# Patient Record
Sex: Female | Born: 1990 | Race: White | Hispanic: No | Marital: Married | Smoking: Never smoker
Health system: Southern US, Community
[De-identification: ages and names within clinical notes are randomized; demographics above are authoritative.]

## PROBLEM LIST (undated history)

## (undated) DIAGNOSIS — T7840XA Allergy, unspecified, initial encounter: Secondary | ICD-10-CM

## (undated) DIAGNOSIS — F32A Depression, unspecified: Secondary | ICD-10-CM

## (undated) DIAGNOSIS — J45909 Unspecified asthma, uncomplicated: Secondary | ICD-10-CM

## (undated) DIAGNOSIS — O24419 Gestational diabetes mellitus in pregnancy, unspecified control: Secondary | ICD-10-CM

## (undated) DIAGNOSIS — N970 Female infertility associated with anovulation: Secondary | ICD-10-CM

## (undated) DIAGNOSIS — Z800 Family history of malignant neoplasm of digestive organs: Secondary | ICD-10-CM

## (undated) DIAGNOSIS — E282 Polycystic ovarian syndrome: Secondary | ICD-10-CM

## (undated) DIAGNOSIS — G43909 Migraine, unspecified, not intractable, without status migrainosus: Secondary | ICD-10-CM

## (undated) DIAGNOSIS — R7303 Prediabetes: Secondary | ICD-10-CM

## (undated) DIAGNOSIS — R42 Dizziness and giddiness: Secondary | ICD-10-CM

## (undated) DIAGNOSIS — E669 Obesity, unspecified: Secondary | ICD-10-CM

## (undated) DIAGNOSIS — F419 Anxiety disorder, unspecified: Secondary | ICD-10-CM

## (undated) DIAGNOSIS — F329 Major depressive disorder, single episode, unspecified: Secondary | ICD-10-CM

## (undated) DIAGNOSIS — K219 Gastro-esophageal reflux disease without esophagitis: Secondary | ICD-10-CM

## (undated) DIAGNOSIS — O99019 Anemia complicating pregnancy, unspecified trimester: Secondary | ICD-10-CM

## (undated) DIAGNOSIS — E559 Vitamin D deficiency, unspecified: Secondary | ICD-10-CM

## (undated) DIAGNOSIS — O219 Vomiting of pregnancy, unspecified: Secondary | ICD-10-CM

## (undated) DIAGNOSIS — R1110 Vomiting, unspecified: Secondary | ICD-10-CM

## (undated) HISTORY — DX: Unspecified asthma, uncomplicated: J45.909

## (undated) HISTORY — DX: Obesity, unspecified: E66.9

## (undated) HISTORY — DX: Family history of malignant neoplasm of digestive organs: Z80.0

## (undated) HISTORY — DX: Gastro-esophageal reflux disease without esophagitis: K21.9

## (undated) HISTORY — DX: Gestational diabetes mellitus in pregnancy, unspecified control: O24.419

## (undated) HISTORY — DX: Depression, unspecified: F32.A

## (undated) HISTORY — DX: Prediabetes: R73.03

## (undated) HISTORY — DX: Polycystic ovarian syndrome: E28.2

## (undated) HISTORY — DX: Vomiting of pregnancy, unspecified: O21.9

## (undated) HISTORY — DX: Vomiting, unspecified: R11.10

## (undated) HISTORY — DX: Migraine, unspecified, not intractable, without status migrainosus: G43.909

## (undated) HISTORY — DX: Vitamin D deficiency, unspecified: E55.9

## (undated) HISTORY — DX: Anxiety disorder, unspecified: F41.9

## (undated) HISTORY — DX: Allergy, unspecified, initial encounter: T78.40XA

## (undated) HISTORY — DX: Major depressive disorder, single episode, unspecified: F32.9

## (undated) HISTORY — PX: WISDOM TOOTH EXTRACTION: SHX21

## (undated) HISTORY — DX: Anemia complicating pregnancy, unspecified trimester: O99.019

## (undated) HISTORY — DX: Female infertility associated with anovulation: N97.0

---
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
## 2015-07-14 ENCOUNTER — Encounter: Payer: Self-pay | Admitting: Psychiatry

## 2015-07-14 ENCOUNTER — Ambulatory Visit (INDEPENDENT_AMBULATORY_CARE_PROVIDER_SITE_OTHER): Payer: BLUE CROSS/BLUE SHIELD | Admitting: Psychiatry

## 2015-07-14 VITALS — BP 139/94 | HR 108 | Temp 99.2°F | Ht 68.0 in | Wt 236.0 lb

## 2015-07-14 DIAGNOSIS — F331 Major depressive disorder, recurrent, moderate: Secondary | ICD-10-CM

## 2015-07-14 DIAGNOSIS — G4700 Insomnia, unspecified: Secondary | ICD-10-CM | POA: Insufficient documentation

## 2015-07-14 DIAGNOSIS — F411 Generalized anxiety disorder: Secondary | ICD-10-CM | POA: Diagnosis not present

## 2015-07-14 DIAGNOSIS — G43909 Migraine, unspecified, not intractable, without status migrainosus: Secondary | ICD-10-CM | POA: Insufficient documentation

## 2015-07-14 DIAGNOSIS — F410 Panic disorder [episodic paroxysmal anxiety] without agoraphobia: Secondary | ICD-10-CM | POA: Diagnosis not present

## 2015-07-14 HISTORY — DX: Migraine, unspecified, not intractable, without status migrainosus: G43.909

## 2015-07-14 MED ORDER — CLONAZEPAM 0.5 MG PO TABS: 0.5000 mg | ORAL_TABLET | Freq: Two times a day (BID) | ORAL | Status: DC

## 2015-07-14 MED ORDER — PAROXETINE HCL 40 MG PO TABS: 40.0000 mg | ORAL_TABLET | ORAL | Status: DC
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
## 2015-07-15 ENCOUNTER — Telehealth: Payer: Self-pay

## 2015-07-15 NOTE — Telephone Encounter (Signed)
pt still receiveds clonazepam. . doctor refilled on 07-14-15 with one refill

## 2015-08-14 ENCOUNTER — Encounter: Payer: Self-pay | Admitting: Psychiatry

## 2015-08-14 ENCOUNTER — Ambulatory Visit (INDEPENDENT_AMBULATORY_CARE_PROVIDER_SITE_OTHER): Payer: BLUE CROSS/BLUE SHIELD | Admitting: Psychiatry

## 2015-08-14 VITALS — BP 118/82 | HR 106 | Temp 98.2°F | Ht 68.0 in | Wt 231.4 lb

## 2015-08-14 DIAGNOSIS — F331 Major depressive disorder, recurrent, moderate: Secondary | ICD-10-CM

## 2015-08-14 DIAGNOSIS — F411 Generalized anxiety disorder: Secondary | ICD-10-CM | POA: Diagnosis not present

## 2015-08-14 DIAGNOSIS — F410 Panic disorder [episodic paroxysmal anxiety] without agoraphobia: Secondary | ICD-10-CM | POA: Diagnosis not present

## 2015-08-14 MED ORDER — CLONAZEPAM 0.5 MG PO TABS: 0.5000 mg | ORAL_TABLET | Freq: Two times a day (BID) | ORAL | Status: DC | PRN
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
## 2015-09-16 ENCOUNTER — Telehealth: Payer: Self-pay

## 2015-09-16 NOTE — Telephone Encounter (Signed)
pt called states that she went to the [facility] dr Jean Rosenthal today and dr. Jean Rosenthal did not want her to take the paxil and the klonopin because she is trying to get pregnant.  dr. Jean Rosenthal would like to know if she can take zoloft.

## 2015-09-17 NOTE — Telephone Encounter (Signed)
pt was advised -per williams will get with nicole and or tina and see which one can see her.

## 2015-10-14 ENCOUNTER — Telehealth: Payer: Self-pay

## 2015-10-14 ENCOUNTER — Ambulatory Visit (INDEPENDENT_AMBULATORY_CARE_PROVIDER_SITE_OTHER): Payer: BLUE CROSS/BLUE SHIELD | Admitting: Licensed Clinical Social Worker

## 2015-10-14 ENCOUNTER — Ambulatory Visit: Payer: BLUE CROSS/BLUE SHIELD | Admitting: Psychiatry

## 2015-10-14 DIAGNOSIS — F331 Major depressive disorder, recurrent, moderate: Secondary | ICD-10-CM | POA: Diagnosis not present

## 2015-10-14 NOTE — Telephone Encounter (Signed)
pt seen Brianna Marshall today., pt states that on 09-16-15 you told her to stop her paxil cause she is trying to get pregant.  pt states today that since she has stopped taking she has been having like shock tingling feeling from her hand to her finger and sometimes it goes from her head to her toes.  Started out several times a day
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
## 2015-10-20 NOTE — Telephone Encounter (Signed)
patient answered phone, she states that she is not having any more tingling feeling.  pt states she is doing better now.

## 2015-10-28 ENCOUNTER — Encounter: Payer: Self-pay | Admitting: Psychiatry

## 2015-10-28 ENCOUNTER — Ambulatory Visit (INDEPENDENT_AMBULATORY_CARE_PROVIDER_SITE_OTHER): Payer: BLUE CROSS/BLUE SHIELD | Admitting: Psychiatry

## 2015-10-28 VITALS — BP 124/88 | HR 108 | Temp 97.7°F | Ht 68.0 in | Wt 235.6 lb

## 2015-10-28 DIAGNOSIS — F410 Panic disorder [episodic paroxysmal anxiety] without agoraphobia: Secondary | ICD-10-CM | POA: Diagnosis not present

## 2015-10-28 DIAGNOSIS — F331 Major depressive disorder, recurrent, moderate: Secondary | ICD-10-CM | POA: Diagnosis not present

## 2015-10-28 DIAGNOSIS — F411 Generalized anxiety disorder: Secondary | ICD-10-CM | POA: Diagnosis not present

## 2015-10-28 NOTE — Progress Notes (Signed)
BH MD/PA/NP OP Progress Note  10/28/2015 3:52 PM Brianna AskewChristine S Marshall  MRN:  [id number]  Subjective:  Patient returns for follow-up of her major depressive disorder, recurrent moderate, generalized anxiety disorder and panic disorder without agoraphobia. She contacted the clinic indicated she is actively trying to become pregnant. I slender on the phone at that time that ideally there are potential risks to the fetus from the antidepressant medications and ideally she should not be on them. Patient Brianna Marshall she is now been off the medication since that discussion in September of this year. She states she's no longer having the electrical like tingling from when she was coming off the medication. She states her mood is been good. States she has had some anxieties week but relates that it was due to the elections. She states he took her last Klonopin last night.  We have agreed that she'll proceed with therapy at this time to address mood and anxiety. He indicates she will try to get pregnant for about a year and if that does not work they may pursue adoption. She states at that point she may pursue returning to medication use. Chief Complaint    Follow-up; Anxiety     Visit Diagnosis:  No diagnosis found.  Past Medical History:  Past Medical History  Diagnosis Date  . Depression   . Obesity   . Polycystic disease, ovaries   . Asthma    History reviewed. No pertinent past surgical history. Family History:  Family History  Problem Relation Age of Onset  . Osteoporosis Mother   . Alcohol abuse Father   . Drug abuse Father   . Alcohol abuse Maternal Aunt   . Bipolar disorder Maternal Aunt   . Depression Paternal Grandmother    Social History:  Social History   Social History  . Marital Status: Married    Spouse Name: N/A  . Number of Children: N/A  . Years of Education: N/A   Social History Main Topics  . Smoking status: Never Smoker   . Smokeless tobacco: Never Used  . Alcohol Use:  0.6 oz/week    0 Standard drinks or equivalent, 0 Glasses of wine, 1 Cans of beer, 0 Shots of liquor per week     Comment: maybe one 1 a month  . Drug Use: No  . Sexual Activity: Yes    Birth Control/ Protection: None   Other Topics Concern  . None   Social History Narrative   Additional History:   Assessment:   Musculoskeletal: Strength & Muscle Tone: within normal limits Gait & Station: normal Patient leans: N/A  Psychiatric Specialty Exam: Anxiety Symptoms include nervous/anxious behavior. Patient reports no insomnia or suicidal ideas.      Review of Systems  Psychiatric/Behavioral: Negative for depression, suicidal ideas, hallucinations, memory loss and substance abuse. The patient is nervous/anxious. The patient does not have insomnia.   All other systems reviewed and are negative.   Blood pressure 124/88, pulse 108, temperature 97.7 F (36.5 C), temperature source Tympanic, height 5\' 8"  (1.727 m), weight 235 lb 9.6 oz (106.867 kg), last menstrual period 10/26/2015, SpO2 98 %.Body mass index is 35.83 kg/(m^2).  General Appearance: Well Groomed  Eye Contact:  Good  Speech:  Normal Rate  Volume:  Normal  Mood:  Good  Affect:  Congruent and Smiling  Thought Process:  Linear and Logical  Orientation:  Full (Time, Place, and Person)  Thought Content:  Negative  Suicidal Thoughts:  No  Homicidal Thoughts:  No  Memory:  Immediate;   Good Recent;   Good Remote;   Good  Judgement:  Good  Insight:  Good  Psychomotor Activity:  Negative  Concentration:  Good  Recall:  Good  Fund of Knowledge: Good  Language: Good  Akathisia:  Negative  Handed:  Right unknown   AIMS (if indicated): N/A  Assets:  Communication Skills Desire for Improvement Social Support  ADL's:  Intact  Cognition: WNL  Sleep:  good   Is the patient at risk to self?  No. Has the patient been a risk to self in the past 6 months?  No. Has the patient been a risk to self within the distant past?   No. Is the patient a risk to others?  No. Has the patient been a risk to others in the past 6 months?  No. Has the patient been a risk to others within the distant past?  No.  Current Medications: Current Outpatient Prescriptions  Medication Sig Dispense Refill  . albuterol (PROVENTIL HFA) 108 (90 BASE) MCG/ACT inhaler Inhale into the lungs.    . medroxyPROGESTERone (PROVERA) 10 MG tablet Take 10 mg by mouth daily.  5  . clonazePAM (KLONOPIN) 0.5 MG tablet Take 1 tablet (0.5 mg total) by mouth 2 (two) times daily as needed for anxiety (May  take one half a tablet instead in the morning instead of a whole tablet.). (Patient not taking: Reported on 10/28/2015) 60 tablet 1  . PARoxetine (PAXIL) 40 MG tablet Take 1 tablet (40 mg total) by mouth every morning. (Patient not taking: Reported on 10/28/2015) 30 tablet 1   No current facility-administered medications for this visit.    Medical Decision Making:  Established Problem, Stable/Improving (1) and Review of Medication Regimen & Side Effects (2)  Treatment Plan Summary:Medication management and Plan  Major  depressive disorder-at this time patient's mood is stable and she is actively trying to get pregnant and thus we made the decision to discontinue use of Paxil. We'll engage in therapy.  Generalized anxiety disorder-therapy to address anxiety issues.  He is been instructed to contact us with any questions or concerns, however she will continue therapy in this clinic.  Wallace Going 10/28/2015, 3:52 PM

## 2015-11-18 ENCOUNTER — Ambulatory Visit (INDEPENDENT_AMBULATORY_CARE_PROVIDER_SITE_OTHER): Payer: BLUE CROSS/BLUE SHIELD | Admitting: Licensed Clinical Social Worker

## 2015-11-18 DIAGNOSIS — F411 Generalized anxiety disorder: Secondary | ICD-10-CM | POA: Diagnosis not present

## 2015-11-24 NOTE — Progress Notes (Signed)
   THERAPIST PROGRESS NOTE  Session Time: 60min  Participation Level: Active  Behavioral Response: Casual and NeatAlertAppropriate  Type of Therapy: Individual Therapy  Treatment Goals addressed: Coping  Interventions: CBT, Motivational Interviewing, Solution Focused, Supportive, Family Systems and Reframing  Summary: Brianna AskewChristine S Marshall is a 24 y.o. female who presents with continued symptoms of her diagnosis.  She states that she is taking her medication as prescribed but at times does not feel relief from diagnosis.  She states that the holidays are the worse time for her due to having to travel and interact with family and in laws.She continues to be supported by her husband and at times he becomes overwhelmed with her anxiety attacks.  Discussed stressors and role played coping skills.   Suicidal/Homicidal: Nowithout intent/plan  Therapist Response: Writer recommends Outpatient Therapy at least twice monthly to include but not limited to individual, group and or family therapy.  Medication Management is also recommended to assist with her mood.   Plan: Return again in 2 weeks.  Diagnosis: Axis I: Generalized Anxiety Disorder    Axis II: No diagnosis    Brianna Marshall 11/24/2015

## 2015-12-09 ENCOUNTER — Ambulatory Visit (INDEPENDENT_AMBULATORY_CARE_PROVIDER_SITE_OTHER): Payer: BLUE CROSS/BLUE SHIELD | Admitting: Licensed Clinical Social Worker

## 2015-12-09 DIAGNOSIS — F331 Major depressive disorder, recurrent, moderate: Secondary | ICD-10-CM | POA: Diagnosis not present

## 2015-12-09 DIAGNOSIS — F411 Generalized anxiety disorder: Secondary | ICD-10-CM | POA: Diagnosis not present

## 2015-12-16 NOTE — Progress Notes (Signed)
   THERAPIST PROGRESS NOTE  Session Time: 60min  Participation Level: Active  Behavioral Response: Casual and NeatAlertDepressed  Type of Therapy: Individual Therapy  Treatment Goals addressed: Coping  Interventions: CBT, Motivational Interviewing, Solution Focused, Supportive and Reframing  Summary: Brianna AskewChristine S Lowrey is a 24 y.o. female who presents with continued symptoms of her diagnosis.  She recently was given several days off from work due to being overwhelmed at work.  She states that her best friend is getting married to a "bad guy" which is stressful for her.  She states that she is worried about the finances in the home and wants to improve her hours at work.  She states that she wants to get pregnant therefore she is not taking her medication..   Suicidal/Homicidal: Yeswithout intent/plan  Therapist Response: LCSW provided Patient with ongoing emotional support and encouragement.  Normalized her feelings.  Commended Patient on her progress and reinforced the importance of client staying focused on her own strengths and resources and resiliency. Processed various strategies for dealing with stressors.    Plan: Return again in 2 weeks.  Diagnosis: Axis I: Generalized Anxiety Disorder and Major Depression, Recurrent severe    Axis II: No diagnosis    [name] 12/16/2015

## 2016-01-07 ENCOUNTER — Ambulatory Visit: Payer: BLUE CROSS/BLUE SHIELD | Admitting: Licensed Clinical Social Worker

## 2016-01-14 ENCOUNTER — Ambulatory Visit (INDEPENDENT_AMBULATORY_CARE_PROVIDER_SITE_OTHER): Payer: BLUE CROSS/BLUE SHIELD | Admitting: Licensed Clinical Social Worker

## 2016-01-14 DIAGNOSIS — F411 Generalized anxiety disorder: Secondary | ICD-10-CM | POA: Diagnosis not present

## 2016-01-20 NOTE — Progress Notes (Signed)
   THERAPIST PROGRESS NOTE  Session Time:9min  Participation Level: Active  Behavioral Response: CasualAlertAnxious  Type of Therapy: Individual Therapy  Treatment Goals addressed: Coping  Interventions: CBT, Solution Focused, Supportive, Family Systems and Reframing  Summary: Brianna Marshall is a 25 y.o. female who presents with continued symptoms of her diagnosis.  She continues to express anxiety with her life.  She is currently wanting to become pregnant so she is not taking her medication.  She continues to have difficulty with working at her current employer due to stress.  She explored current stressors and was unable to discuss any coping skills to diminish her symptoms.  Given homework to journal and focus on listing coping skills.   Suicidal/Homicidal: Nowithout intent/plan  Therapist Response: LCSW provided Patient with ongoing emotional support and encouragement.  Normalized her feelings.  Commended Patient on her progress and reinforced the importance of client staying focused on her own strengths and resources and resiliency. Processed various strategies for dealing with stressors.    Plan: Return again in 2 weeks.  Diagnosis: Axis I: Generalized Anxiety Disorder    Axis II: No diagnosis    Marinda Elk 01/15/2016

## 2016-02-11 ENCOUNTER — Ambulatory Visit (INDEPENDENT_AMBULATORY_CARE_PROVIDER_SITE_OTHER): Payer: BLUE CROSS/BLUE SHIELD | Admitting: Licensed Clinical Social Worker

## 2016-02-11 DIAGNOSIS — F411 Generalized anxiety disorder: Secondary | ICD-10-CM

## 2016-02-16 NOTE — Progress Notes (Signed)
   THERAPIST PROGRESS NOTE  Session Time: 86  Participation Level: Active  Behavioral Response: CasualAlertAnxious  Type of Therapy: Individual Therapy  Treatment Goals addressed: Coping  Interventions: CBT, Motivational Interviewing, Solution Focused, Family Systems and Reframing  Summary: Brianna Marshall is a 25 y.o. female who presents with continued symptoms of her diagnosis.  Patient expressed her concerns regarding her friend upcoming wedding. Therapist and Patient continued with discussion about avoiding triggers to anxiety.  Therapist requested that Patient be mindful of the things that cause her to become anxious over the next week.  Therapist encouraged Patient to utilize a journal. Therapist educated Patient on the positive effects of a journal. Therapist requested that Patient note her reaction to the trigger as well. Discussion of Therapist going on Maternity Leave and provided patient with a list of therapist in the area.   Suicidal/Homicidal: Nowithout intent/plan  Therapist Response: LCSW provided Patient with ongoing emotional support and encouragement.  Normalized her feelings.  Commended Patient on her progress and reinforced the importance of client staying focused on her own strengths and resources and resiliency. Processed various strategies for dealing with stressors.    Plan: Discussion of Therapist going on Maternity Leave and provided patient with a list of therapist in the area. .  Diagnosis: Axis I: Generalized Anxiety Disorder    Axis II: No diagnosis    Marinda Elk 02/13/2016

## 2016-06-09 ENCOUNTER — Ambulatory Visit: Payer: No Typology Code available for payment source | Admitting: Licensed Clinical Social Worker

## 2016-07-08 ENCOUNTER — Ambulatory Visit (INDEPENDENT_AMBULATORY_CARE_PROVIDER_SITE_OTHER): Payer: BLUE CROSS/BLUE SHIELD | Admitting: Internal Medicine

## 2016-07-08 ENCOUNTER — Encounter: Payer: Self-pay | Admitting: Internal Medicine

## 2016-07-08 ENCOUNTER — Ambulatory Visit
Admission: RE | Admit: 2016-07-08 | Discharge: 2016-07-08 | Disposition: A | Discharge status: Home / Self Care | Payer: BLUE CROSS/BLUE SHIELD | Source: Ambulatory Visit | Attending: Internal Medicine | Admitting: Internal Medicine

## 2016-07-08 VITALS — BP 122/78 | HR 106 | Resp 16 | Ht 68.0 in | Wt 229.0 lb

## 2016-07-08 DIAGNOSIS — R7303 Prediabetes: Secondary | ICD-10-CM

## 2016-07-08 DIAGNOSIS — E282 Polycystic ovarian syndrome: Secondary | ICD-10-CM | POA: Diagnosis not present

## 2016-07-08 DIAGNOSIS — M79671 Pain in right foot: Secondary | ICD-10-CM | POA: Diagnosis present

## 2016-07-08 DIAGNOSIS — E559 Vitamin D deficiency, unspecified: Secondary | ICD-10-CM

## 2016-07-08 HISTORY — DX: Vitamin D deficiency, unspecified: E55.9

## 2016-07-08 HISTORY — DX: Prediabetes: R73.03

## 2016-07-08 HISTORY — DX: Polycystic ovarian syndrome: E28.2

## 2016-07-08 NOTE — Progress Notes (Signed)
Date:  07/08/2016   Name:  Brianna Marshall   DOB:  December 02, 1991   MRN:  [id number]   Chief Complaint: Foot Pain She has been walking 3 miles a day and dieting for weight loss.  Several weeks ago walked in old shoes with no laces.  Afterwards started to have pain in the top of her right foot.  She took it easy a few days then started walking again in new shoes. Now her foot is much more painful, slightly swollen on the outer ankle.  She has been taking some tylenol but not regularly.  She has rested the past 2 days.   Review of Systems  Constitutional: Negative for fever, chills and fatigue.  Respiratory: Negative for chest tightness, shortness of breath and wheezing.   Cardiovascular: Negative for chest pain.  Musculoskeletal: Positive for joint swelling, arthralgias and gait problem.  Neurological: Negative for weakness and numbness.    Patient Active Problem List   Diagnosis Date Noted  . PCOS (polycystic ovarian syndrome) 07/08/2016  . Prediabetes 07/08/2016  . Vitamin D deficiency 07/08/2016  . Headache, migraine 07/14/2015    Prior to Admission medications   Medication Sig Start Date End Date Taking? Authorizing Provider  albuterol (PROVENTIL HFA) 108 (90 BASE) MCG/ACT inhaler Inhale into the lungs. 10/21/14  Yes Historical Provider, MD  medroxyPROGESTERone (PROVERA) 10 MG tablet Take 10 mg by mouth daily. 06/17/15  Yes Historical Provider, MD    Allergies  Allergen Reactions  . Amoxicillin Rash    History reviewed. No pertinent past surgical history.  Social History  Substance Use Topics  . Smoking status: Never Smoker   . Smokeless tobacco: Never Used  . Alcohol Use: 0.6 oz/week    0 Standard drinks or equivalent, 0 Glasses of wine, 1 Cans of beer, 0 Shots of liquor per week     Comment: maybe one 1 a month     Medication list has been reviewed and updated.   Physical Exam  Constitutional: She is oriented to person, place, and time. She appears  well-developed and well-nourished. No distress.  HENT:  Head: Normocephalic and atraumatic.  Neck: Normal range of motion. Neck supple. No thyromegaly present.  Cardiovascular: Normal rate, regular rhythm and normal heart sounds.   Pulses:      Dorsalis pedis pulses are 2+ on the right side, and 2+ on the left side.       Posterior tibial pulses are 2+ on the right side, and 2+ on the left side.  Pulmonary/Chest: Effort normal and breath sounds normal. No respiratory distress. She has no wheezes.  Musculoskeletal:       Right ankle: She exhibits decreased range of motion and swelling. She exhibits no ecchymosis. Tenderness. Achilles tendon normal.       Feet:  Neurological: She is alert and oriented to person, place, and time.  Skin: Skin is warm and dry. No rash noted.  Psychiatric: She has a normal mood and affect. Her behavior is normal. Thought content normal.  Nursing note and vitals reviewed.   BP 122/78 mmHg  Pulse 106  Resp 16  Ht  (1.727 m)  Wt 229 lb (103.874 kg)  BMI 34.83 kg/m2  SpO2 98%  LMP 04/24/2016  Assessment and Plan: 1. Foot pain, right Concern for stress fracture If xrays negative - take advil 400 mg tid and ice daily - DG Foot Complete Right; Future - DG Ankle Complete Right; Future  2. PCOS (polycystic ovarian syndrome) [facility]   Jazzalyn Loewenstein, MD [facility]  07/08/2016

## 2016-07-08 NOTE — Patient Instructions (Signed)
Ibuprofen 400 mg three times a day.  Ice for 20 minutes after walking and at the end of the day.

## 2016-07-28 ENCOUNTER — Ambulatory Visit: Payer: BLUE CROSS/BLUE SHIELD | Admitting: Licensed Clinical Social Worker

## 2016-07-28 ENCOUNTER — Encounter: Payer: Self-pay | Admitting: Psychiatry

## 2016-07-28 ENCOUNTER — Ambulatory Visit (INDEPENDENT_AMBULATORY_CARE_PROVIDER_SITE_OTHER): Payer: BLUE CROSS/BLUE SHIELD | Admitting: Psychiatry

## 2016-07-28 VITALS — BP 137/97 | HR 90 | Temp 98.0°F | Ht 66.0 in | Wt 227.2 lb

## 2016-07-28 DIAGNOSIS — F410 Panic disorder [episodic paroxysmal anxiety] without agoraphobia: Secondary | ICD-10-CM

## 2016-07-28 DIAGNOSIS — F331 Major depressive disorder, recurrent, moderate: Secondary | ICD-10-CM

## 2016-07-28 MED ORDER — QUETIAPINE FUMARATE 25 MG PO TABS: 25.0000 mg | ORAL_TABLET | Freq: Every day | ORAL | 1 refills | Status: DC

## 2016-07-28 MED ORDER — VENLAFAXINE HCL ER 75 MG PO CP24: 75.0000 mg | ORAL_CAPSULE | Freq: Every day | ORAL | 1 refills | Status: DC

## 2016-07-28 NOTE — Progress Notes (Signed)
BH MD/PA/NP OP Progress Note  07/28/2016 9:37 AM Brianna Marshall  MRN:  [id number]  Subjective:  Patient is 25 year old female who was previously following Dr. Mayford Knife. She return for the appointment after she was last seen in November. She reported that she has been experiencing panic attacks for the past 2-3 months. She reported that she has been having panic attacks which has worsened in the past few weeks. She is currently afraid of her boss who is very mean. She is working in [employer]. She feels that she is afraid of making mistakes. She has 12 panic attacks in the past week. Patient reported that she is not taking any medications at this time. She is interested in taking medications to help with the anxiety panic attacks and insomnia. She currently lives with her husband. She appeared anxious and apprehensive during the interview. Patient reported that she is not planning to become pregnant at this time. She is not following with any therapist as well.  She denied having any suicidal homicidal ideations or plans. She denied using any drugs or alcohol.   Chief Complaint    Panic Attack; Anxiety     Visit Diagnosis:     ICD-9-CM ICD-10-CM   1. Panic disorder 300.01 F41.0   2. Major depressive disorder, recurrent episode, moderate (HCC) 296.32 F33.1     Past Medical History:  Past Medical History:  Diagnosis Date  . Asthma   . Depression   . Obesity   . Polycystic disease, ovaries    History reviewed. No pertinent surgical history. Family History:  Family History  Problem Relation Age of Onset  . Osteoporosis Mother   . Alcohol abuse Father   . Drug abuse Father   . Alcohol abuse Maternal Aunt   . Bipolar disorder Maternal Aunt   . Depression Paternal Grandmother    Social History:  Social History   Social History  . Marital status: Married    Spouse name: N/A  . Number of children: N/A  . Years of education: N/A   Social History Main Topics  . Smoking  status: Never Smoker  . Smokeless tobacco: Never Used  . Alcohol use 0.6 oz/week    1 Cans of beer per week     Comment: maybe one 1 a month  . Drug use: No  . Sexual activity: Yes    Birth control/ protection: None   Other Topics Concern  . None   Social History Narrative  . None   Additional History:   Assessment:   Musculoskeletal: Strength & Muscle Tone: within normal limits Gait & Station: normal Patient leans: N/A  Psychiatric Specialty Exam: Anxiety  Symptoms include insomnia and nervous/anxious behavior. Patient reports no suicidal ideas.      Review of Systems  Musculoskeletal: Positive for joint pain and myalgias.  Psychiatric/Behavioral: Positive for depression. Negative for hallucinations, memory loss, substance abuse and suicidal ideas. The patient is nervous/anxious and has insomnia.   All other systems reviewed and are negative.   Blood pressure (!) 137/97, pulse 90, temperature 98 F (36.7 C), temperature source Oral, height  (1.676 m), weight 227 lb 3.2 oz (103.1 kg), last menstrual period 04/24/2016.Body mass index is 36.67 kg/m.  General Appearance: Well Groomed  Eye Contact:  Good  Speech:  Normal Rate  Volume:  Normal  Mood:  Good  Affect:  Appropriate and congruent  Thought Process:  Linear and Logical  Orientation:  Full (Time, Place, and Person)  Thought Content:  Negative  Suicidal Thoughts:  No  Homicidal Thoughts:  No  Memory:  Immediate;   Good Recent;   Good Remote;   Good  Judgement:  Good  Insight:  Good  Psychomotor Activity:  Normal  Concentration:  Good  Recall:  Good  Fund of Knowledge: Good  Language: Good  Akathisia:  Negative  Handed:  Right unknown   AIMS (if indicated): N/A  Assets:  Communication Skills Desire for Improvement Social Support  ADL's:  Intact  Cognition: WNL  Sleep:  good   Is the patient at risk to self?  No. Has the patient been a risk to self in the past 6 months?  No. Has the patient  been a risk to self within the distant past?  No. Is the patient a risk to others?  No. Has the patient been a risk to others in the past 6 months?  No. Has the patient been a risk to others within the distant past?  No.  Current Medications: Current Outpatient Prescriptions  Medication Sig Dispense Refill  . albuterol (PROVENTIL HFA) 108 (90 BASE) MCG/ACT inhaler Inhale into the lungs.     No current facility-administered medications for this visit.     Medical Decision Making:  Established Problem, Stable/Improving (1) and Review of Medication Regimen & Side Effects (2)  Treatment Plan Summary:Medication management and Plan  Patient will be started on Effexor XR 75 mg in the morning to help with her anxiety and panic attacks. We'll also start her on Seroquel 25 mg at bedtime to help with her anxiety and depression and she agreed with the plan. Discussed with her about the side effects in detail and she demonstrated understanding. Follow-up in 6 weeks or earlier depending on her symptoms.   More than 50% of the time spent in psychoeducation, counseling and coordination of care.    This note was generated in part or whole with voice recognition software. Voice regonition is usually quite accurate but there are transcription errors that can and very often do occur. I apologize for any typographical errors that were not detected and corrected.   Brandy HaleUzma Lila Lufkin 07/28/2016, 9:37 AM

## 2016-08-26 ENCOUNTER — Other Ambulatory Visit: Payer: Self-pay | Admitting: Psychiatry

## 2016-08-31 ENCOUNTER — Encounter: Payer: Self-pay | Admitting: Psychiatry

## 2016-08-31 ENCOUNTER — Ambulatory Visit (INDEPENDENT_AMBULATORY_CARE_PROVIDER_SITE_OTHER): Payer: BLUE CROSS/BLUE SHIELD | Admitting: Psychiatry

## 2016-08-31 VITALS — BP 112/80 | HR 108 | Temp 98.5°F | Ht 66.0 in | Wt 217.0 lb

## 2016-08-31 DIAGNOSIS — F331 Major depressive disorder, recurrent, moderate: Secondary | ICD-10-CM

## 2016-08-31 DIAGNOSIS — F410 Panic disorder [episodic paroxysmal anxiety] without agoraphobia: Secondary | ICD-10-CM | POA: Diagnosis not present

## 2016-08-31 MED ORDER — VENLAFAXINE HCL ER 75 MG PO CP24: 75.0000 mg | ORAL_CAPSULE | Freq: Every day | ORAL | 1 refills | Status: DC

## 2016-08-31 MED ORDER — QUETIAPINE FUMARATE 25 MG PO TABS: 25.0000 mg | ORAL_TABLET | Freq: Every day | ORAL | 1 refills | Status: DC

## 2016-08-31 NOTE — Progress Notes (Signed)
BH MD/PA/NP OP Progress Note  08/31/2016 3:05 PM Robyne AskewChristine S Wimberley  MRN:  [id number]  Subjective:  Patient is 25 year old female presented for follow-up. She reported that she is very excited as she is not noticing any of her panic attacks. She reported that she has only experienced one panic attack in the past month. She reported that she is excited that the medication has really helped her and she is becoming more outgoing. The only adverse effects she is experiencing is hyperhidrosis related to the Effexor. She is not concerned about the same. She feels that she is sleeping well with the help of the Seroquel. She is trying to lose weight and is exercising and going to yoga on a daily basis. She appeared calm and alert during the interview. She reported that the medications are helping her. She does not want to change any medications at this time.  Patient currently denied using any drugs or alcohol. She denied having any suicidal homicidal ideations or plans.     Chief Complaint    Follow-up; Medication Refill     Visit Diagnosis:     ICD-9-CM ICD-10-CM   1. Panic disorder 300.01 F41.0   2. Major depressive disorder, recurrent episode, moderate (HCC) 296.32 F33.1     Past Medical History:  Past Medical History:  Diagnosis Date  . Asthma   . Depression   . Obesity   . Polycystic disease, ovaries    History reviewed. No pertinent surgical history. Family History:  Family History  Problem Relation Age of Onset  . Osteoporosis Mother   . Alcohol abuse Father   . Drug abuse Father   . Alcohol abuse Maternal Aunt   . Bipolar disorder Maternal Aunt   . Depression Paternal Grandmother    Social History:  Social History   Social History  . Marital status: Married    Spouse name: N/A  . Number of children: N/A  . Years of education: N/A   Social History Main Topics  . Smoking status: Never Smoker  . Smokeless tobacco: Never Used  . Alcohol use 0.6 oz/week    1 Cans of  beer per week     Comment: maybe one 1 a month  . Drug use: No  . Sexual activity: Yes    Birth control/ protection: None   Other Topics Concern  . None   Social History Narrative  . None   Additional History:   Assessment:   Musculoskeletal: Strength & Muscle Tone: within normal limits Gait & Station: normal Patient leans: N/A  Psychiatric Specialty Exam: Anxiety  Symptoms include insomnia and nervous/anxious behavior. Patient reports no suicidal ideas.      Review of Systems  Musculoskeletal: Positive for joint pain and myalgias.  Psychiatric/Behavioral: Positive for depression. Negative for hallucinations, memory loss, substance abuse and suicidal ideas. The patient is nervous/anxious and has insomnia.   All other systems reviewed and are negative.   Blood pressure 112/80, pulse (!) 108, temperature 98.5 F (36.9 C), temperature source Oral, height 5\' 6"  (1.676 m), weight 217 lb (98.4 kg), last menstrual period 08/30/2016.Body mass index is 35.02 kg/m.  General Appearance: Well Groomed  Eye Contact:  Good  Speech:  Normal Rate  Volume:  Normal  Mood:  Good  Affect:  Appropriate and congruent  Thought Process:  Linear and Logical  Orientation:  Full (Time, Place, and Person)  Thought Content:  Negative  Suicidal Thoughts:  No  Homicidal Thoughts:  No  Memory:  Immediate;  Good Recent;   Good Remote;   Good  Judgement:  Good  Insight:  Good  Psychomotor Activity:  Normal  Concentration:  Good  Recall:  Good  Fund of Knowledge: Good  Language: Good  Akathisia:  Negative  Handed:  Right unknown   AIMS (if indicated): N/A  Assets:  Communication Skills Desire for Improvement Social Support  ADL's:  Intact  Cognition: WNL  Sleep:  good   Is the patient at risk to self?  No. Has the patient been a risk to self in the past 6 months?  No. Has the patient been a risk to self within the distant past?  No. Is the patient a risk to others?  No. Has the  patient been a risk to others in the past 6 months?  No. Has the patient been a risk to others within the distant past?  No.  Current Medications: Current Outpatient Prescriptions  Medication Sig Dispense Refill  . albuterol (PROVENTIL HFA) 108 (90 BASE) MCG/ACT inhaler Inhale into the lungs.    Marland Kitchen QUEtiapine (SEROQUEL) 25 MG tablet Take 1 tablet (25 mg total) by mouth at bedtime. 30 tablet 1  . venlafaxine XR (EFFEXOR XR) 75 MG 24 hr capsule Take 1 capsule (75 mg total) by mouth daily with breakfast. 30 capsule 1   No current facility-administered medications for this visit.     Medical Decision Making:  Established Problem, Stable/Improving (1) and Review of Medication Regimen & Side Effects (2)  Treatment Plan Summary:Medication management and Plan  Continue Effexor XR 75 mg in the morning to help with her anxiety and panic attacks. Continue  Seroquel 25 mg at bedtime to help with her anxiety and depression    Follow-up in 2 months or earlier depending on her symptoms.   More than 50% of the time spent in psychoeducation, counseling and coordination of care.    This note was generated in part or whole with voice recognition software. Voice regonition is usually quite accurate but there are transcription errors that can and very often do occur. I apologize for any typographical errors that were not detected and corrected.   Brandy Hale 08/31/2016, 3:05 PM

## 2016-10-31 ENCOUNTER — Ambulatory Visit: Payer: BLUE CROSS/BLUE SHIELD | Admitting: Psychiatry

## 2016-11-14 ENCOUNTER — Ambulatory Visit (INDEPENDENT_AMBULATORY_CARE_PROVIDER_SITE_OTHER): Payer: BLUE CROSS/BLUE SHIELD | Admitting: Licensed Clinical Social Worker

## 2016-11-14 DIAGNOSIS — F331 Major depressive disorder, recurrent, moderate: Secondary | ICD-10-CM

## 2016-11-14 DIAGNOSIS — F411 Generalized anxiety disorder: Secondary | ICD-10-CM

## 2016-11-17 NOTE — Progress Notes (Signed)
   THERAPIST PROGRESS NOTE  Session Time: 5760  Participation Level: Active  Behavioral Response: Casual and NeatAlertAnxious  Type of Therapy: Individual Therapy  Treatment Goals addressed: Coping  Interventions: CBT, Motivational Interviewing and Solution Focused  Summary: Brianna AskewChristine S Marshall is a 25 y.o. female who presents with continued symptoms of her diagnosis.  Patient reprots that she has not been able to manage her symptoms well.  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  Patient reports that in the past few months she has worked 2 jobs and was unable to continue due to her anxiety.  Patient denies any concerns in her marriage.  Patient reports that she has used previous coping skills taught but was unsuccessful.  Factors that contribute to client's ongoing depressive and anxiety symptoms were discussed and include real and perceived feelings of isolation, criticism, rejection, shame and guilt.     Suicidal/Homicidal: No  Therapist Response:. LCSW provided Patient with ongoing emotional support and encouragement.  Discussion of Patient continuing therapy to reduce anxiety and possible symptoms. Commended Patient on returning to reduce feelings.  Normalized her feelings.  Commended Patient on her progress and reinforced the importance of client staying focused on her own strengths and resources and resiliency. Processed various strategies for dealing with stressors.    Plan: Return again in 2 weeks.  Diagnosis: Axis I: Generalized Anxiety Disorder    Axis II: No diagnosis    Brianna Elkicole M Peacock, LCSW 11/17/2016

## 2017-02-24 ENCOUNTER — Other Ambulatory Visit: Payer: BLUE CROSS/BLUE SHIELD

## 2017-02-24 DIAGNOSIS — N970 Female infertility associated with anovulation: Secondary | ICD-10-CM

## 2017-02-24 NOTE — Progress Notes (Signed)
progesteron

## 2017-02-25 LAB — PROGESTERONE

## 2017-03-10 ENCOUNTER — Other Ambulatory Visit: Payer: Self-pay | Admitting: Obstetrics and Gynecology

## 2017-03-10 DIAGNOSIS — N970 Female infertility associated with anovulation: Secondary | ICD-10-CM

## 2017-03-10 HISTORY — DX: Female infertility associated with anovulation: N97.0

## 2017-03-10 MED ORDER — MEDROXYPROGESTERONE ACETATE 10 MG PO TABS: 10.0000 mg | ORAL_TABLET | Freq: Every day | ORAL | 0 refills | Status: DC

## 2017-03-22 ENCOUNTER — Other Ambulatory Visit: Payer: Self-pay | Admitting: Obstetrics and Gynecology

## 2017-03-22 DIAGNOSIS — N970 Female infertility associated with anovulation: Secondary | ICD-10-CM

## 2017-03-22 DIAGNOSIS — E282 Polycystic ovarian syndrome: Secondary | ICD-10-CM

## 2017-03-22 MED ORDER — LETROZOLE 2.5 MG PO TABS: 5.0000 mg | ORAL_TABLET | Freq: Every day | ORAL | 0 refills | Status: AC

## 2017-03-22 NOTE — Progress Notes (Signed)
Patient to start letrozole this month. LMP day 1 is 03/22/17.  Rx for letrozole sent to pharmacy of record to be started on cycle day 3 and taken through cycle day 7 (4/6-4/10) Progesterone level to be checked on cycle day 21 (4/24).

## 2017-04-11 ENCOUNTER — Other Ambulatory Visit: Payer: BLUE CROSS/BLUE SHIELD

## 2017-04-11 DIAGNOSIS — N970 Female infertility associated with anovulation: Secondary | ICD-10-CM

## 2017-04-11 DIAGNOSIS — E282 Polycystic ovarian syndrome: Secondary | ICD-10-CM

## 2017-04-12 ENCOUNTER — Encounter: Payer: Self-pay | Admitting: Obstetrics and Gynecology

## 2017-04-12 LAB — PROGESTERONE: PROGESTERONE: 7 ng/mL

## 2017-04-22 ENCOUNTER — Other Ambulatory Visit: Payer: Self-pay | Admitting: Obstetrics and Gynecology

## 2017-04-22 DIAGNOSIS — N970 Female infertility associated with anovulation: Secondary | ICD-10-CM

## 2017-04-22 MED ORDER — LETROZOLE 2.5 MG PO TABS: 5.0000 mg | ORAL_TABLET | Freq: Every day | ORAL | 0 refills | Status: AC

## 2017-04-22 NOTE — Progress Notes (Signed)
Cycle day #1 on 04/21/17, Letrozole 5mg  called in for ovulation induction. 2nd round.

## 2017-05-01 ENCOUNTER — Other Ambulatory Visit: Payer: Self-pay | Admitting: Obstetrics and Gynecology

## 2017-05-01 DIAGNOSIS — N970 Female infertility associated with anovulation: Secondary | ICD-10-CM

## 2017-05-01 DIAGNOSIS — E282 Polycystic ovarian syndrome: Secondary | ICD-10-CM

## 2017-05-11 ENCOUNTER — Other Ambulatory Visit: Payer: BLUE CROSS/BLUE SHIELD

## 2017-05-11 DIAGNOSIS — N970 Female infertility associated with anovulation: Secondary | ICD-10-CM

## 2017-05-11 DIAGNOSIS — E282 Polycystic ovarian syndrome: Secondary | ICD-10-CM

## 2017-05-12 ENCOUNTER — Encounter: Payer: Self-pay | Admitting: Obstetrics and Gynecology

## 2017-05-12 LAB — PROGESTERONE: PROGESTERONE: 11 ng/mL

## 2017-05-22 ENCOUNTER — Other Ambulatory Visit: Payer: BLUE CROSS/BLUE SHIELD

## 2017-05-22 ENCOUNTER — Other Ambulatory Visit: Payer: Self-pay | Admitting: Obstetrics and Gynecology

## 2017-05-22 DIAGNOSIS — N970 Female infertility associated with anovulation: Secondary | ICD-10-CM

## 2017-05-23 ENCOUNTER — Other Ambulatory Visit: Payer: Self-pay | Admitting: Obstetrics and Gynecology

## 2017-05-23 DIAGNOSIS — N970 Female infertility associated with anovulation: Secondary | ICD-10-CM

## 2017-05-23 LAB — BETA HCG QUANT (REF LAB)

## 2017-05-23 MED ORDER — LETROZOLE 2.5 MG PO TABS: 5.0000 mg | ORAL_TABLET | Freq: Every day | ORAL | 0 refills | Status: AC

## 2017-06-09 ENCOUNTER — Ambulatory Visit (INDEPENDENT_AMBULATORY_CARE_PROVIDER_SITE_OTHER): Payer: Self-pay | Admitting: Internal Medicine

## 2017-06-09 ENCOUNTER — Encounter: Payer: Self-pay | Admitting: Internal Medicine

## 2017-06-09 VITALS — BP 132/82 | HR 106 | Ht 68.0 in | Wt 196.0 lb

## 2017-06-09 DIAGNOSIS — L723 Sebaceous cyst: Secondary | ICD-10-CM

## 2017-06-09 NOTE — Progress Notes (Signed)
    Date:  06/09/2017   Name:  Brianna AskewChristine S Marshall   DOB:  1991/05/02   MRN:  [id number]   Chief Complaint: Mass (Lump on back of neck. Closed under skin. Been there for 2 years. It was "pea-sized" up until 2 weeks ago. Has became enlarged. Its only painful when it's hot. ) HPI    Review of Systems  Constitutional: Negative for chills, fatigue and fever.  Respiratory: Negative for chest tightness and shortness of breath.   Cardiovascular: Negative for chest pain.  Skin:       Cyst on back of neck - no drainage but causing pressure    Patient Active Problem List   Diagnosis Date Noted  . Infertility, anovulation 03/10/2017  . PCOS (polycystic ovarian syndrome) 07/08/2016  . Prediabetes 07/08/2016  . Vitamin D deficiency 07/08/2016  . Headache, migraine 07/14/2015    Prior to Admission medications   Medication Sig Start Date End Date Taking? Authorizing Provider  albuterol (PROVENTIL HFA) 108 (90 BASE) MCG/ACT inhaler Inhale into the lungs. 10/21/14  Yes [provider]  letrozole (FEMARA) 2.5 MG tablet Take 2.5 mg by mouth daily.   Yes [provider]  medroxyPROGESTERone (PROVERA) 10 MG tablet Take 10 mg by mouth daily.   Yes [provider]    Allergies  Allergen Reactions  . Amoxicillin Rash    History reviewed. No pertinent surgical history.  Social History  Substance Use Topics  . Smoking status: Never Smoker  . Smokeless tobacco: Never Used  . Alcohol use 0.6 oz/week    1 Cans of beer per week     Comment: maybe one 1 a month     Medication list has been reviewed and updated.   Physical Exam  Constitutional: She is oriented to person, place, and time. She appears well-developed. No distress.  HENT:  Head: Normocephalic and atraumatic.  Pulmonary/Chest: Effort normal. No respiratory distress.  Musculoskeletal: Normal range of motion.  Neurological: She is alert and oriented to person, place, and time.  Skin: Skin is warm and  dry. No rash noted.     Psychiatric: She has a normal mood and affect. Her behavior is normal. Thought content normal.  Nursing note and vitals reviewed.   BP 132/82   Pulse (!) 106   Ht 5\' 8"  (1.727 m)   Wt 196 lb (88.9 kg)   LMP 05/23/2017 (Exact Date)   SpO2 98%   BMI 29.80 kg/m   Assessment and Plan: 1. Sebaceous cyst - Ambulatory referral to General Surgery   No orders of the defined types were placed in this encounter.   Bari EdwardLaura Aiyanna Awtrey, MD [facility]  06/09/2017

## 2017-06-12 ENCOUNTER — Other Ambulatory Visit: Payer: BLUE CROSS/BLUE SHIELD

## 2017-06-12 DIAGNOSIS — N970 Female infertility associated with anovulation: Secondary | ICD-10-CM

## 2017-06-13 ENCOUNTER — Encounter: Payer: Self-pay | Admitting: Obstetrics and Gynecology

## 2017-06-13 LAB — PROGESTERONE: Progesterone: 12 ng/mL

## 2017-06-22 ENCOUNTER — Other Ambulatory Visit: Payer: Self-pay | Admitting: Obstetrics and Gynecology

## 2017-06-22 DIAGNOSIS — N970 Female infertility associated with anovulation: Secondary | ICD-10-CM

## 2017-06-22 MED ORDER — LETROZOLE 2.5 MG PO TABS: 5.0000 mg | ORAL_TABLET | Freq: Every day | ORAL | 0 refills | Status: DC

## 2017-06-23 ENCOUNTER — Other Ambulatory Visit: Payer: Self-pay | Admitting: Obstetrics and Gynecology

## 2017-06-23 DIAGNOSIS — N970 Female infertility associated with anovulation: Secondary | ICD-10-CM

## 2017-06-23 MED ORDER — LETROZOLE 2.5 MG PO TABS: 5.0000 mg | ORAL_TABLET | Freq: Every day | ORAL | 0 refills | Status: AC

## 2017-07-06 ENCOUNTER — Ambulatory Visit (INDEPENDENT_AMBULATORY_CARE_PROVIDER_SITE_OTHER): Payer: BLUE CROSS/BLUE SHIELD | Admitting: Surgery

## 2017-07-06 ENCOUNTER — Encounter: Payer: Self-pay | Admitting: Surgery

## 2017-07-06 VITALS — BP 123/89 | HR 92 | Temp 98.2°F | Ht 68.0 in | Wt 196.6 lb

## 2017-07-06 DIAGNOSIS — L720 Epidermal cyst: Secondary | ICD-10-CM

## 2017-07-06 NOTE — Progress Notes (Signed)
Surgical Clinic History and Physical  Referring provider:  Reubin Milan, MD 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 Patient reports she experiences frequent migraine headaches, for which she uses a contoured pillow, but the mass causes her discomfort and limits her ability to sleep comfortably on her pillow, resulting in worse and more frequent migraine headaches. She describes the mass often grows in size for several months and then shrinks. She says that it has become smaller since she saw her PMD who referred patient for management. She denies any history of the mass becoming or appearing infected and likewise denies any prior procedures or interventions for the mass. Patient otherwise denies any fever/chills, N/V, CP, or SOB. Of note, patient also mentions that she is currently on fertility medication while trying to get pregnant and asks whether any medications needed for surgery are likely to interfere with the medications or with pregnancy.  PAST MEDICAL HISTORY (PMH):  Past Medical History:  Diagnosis Date  . Asthma   . Depression   . Headache, migraine 07/14/2015  . Infertility, anovulation 03/10/2017  . Obesity   . PCOS (polycystic ovarian syndrome) 07/08/2016  . Polycystic disease, ovaries   . Prediabetes 07/08/2016   Hgb 5.8  04/2015   . Vitamin D deficiency 07/08/2016   18.7 in 04/2015      PAST SURGICAL HISTORY [facility]):  Past Surgical History:  Procedure Laterality Date  . INCISE AND DRAIN ABCESS Right 2011   Posterior Thigh     MEDICATIONS:  Prior to Admission medications   Medication Sig Start Date End Date Taking? Authorizing Provider  albuterol (PROVENTIL HFA) 108 (90 BASE) MCG/ACT inhaler Inhale into the lungs. 10/21/14  Yes [provider]  letrozole (FEMARA) 2.5 MG tablet Take 5 mg by mouth daily.    Yes [provider]  medroxyPROGESTERone (PROVERA) 10 MG tablet Take 10 mg by mouth daily.   Yes [provider]     ALLERGIES:  Allergies  Allergen Reactions  . Amoxicillin Rash     SOCIAL HISTORY:  Social History   Social History  . Marital status: Married    Spouse name: N/A  . Number of children: N/A  . Years of education: N/A   Occupational History  . Not on file.   Social History Main Topics  . Smoking status: Never Smoker  . Smokeless tobacco: Never Used  . Alcohol use 0.0 oz/week     Comment: Occasional Wine  . Drug use: No  . Sexual activity: Yes    Birth control/ protection: None   Other Topics Concern  . Not on file   Social History Narrative  . No narrative on file    The patient currently resides (home / rehab facility / nursing home): Home  The patient normally is (ambulatory / bedbound): Ambulatory   FAMILY HISTORY:  Family History  Problem Relation Age of Onset  . Osteoporosis Mother   . Alcohol abuse Father   . Drug abuse Father   . Alcohol abuse Maternal Aunt   . Bipolar disorder Maternal Aunt   . Depression Paternal Grandmother     Otherwise negative/non-contributory.  REVIEW OF SYSTEMS:  Constitutional: denies any other weight loss, fever, chills, or sweats  Eyes: denies any other vision changes, history of eye injury  ENT: denies sore throat, hearing problems  Respiratory: denies shortness of breath,  wheezing  Cardiovascular: denies chest pain, palpitations  Gastrointestinal: denies abdominal pain, N/V, or diarrhea Musculoskeletal: denies any other joint pains or cramps  Skin: Denies any other rashes or skin discolorations  Neurological: denies any other headache, dizziness, weakness  Psychiatric: Denies any other depression, anxiety   All other review of systems were otherwise negative   VITAL SIGNS:  BP 123/89   Pulse 92   Temp 98.2 F (36.8 C) (Oral)   Ht 5\' 8"  (1.727 m)   Wt 196 lb 9.6 oz (89.2 kg)    BMI 29.89 kg/m   PHYSICAL EXAM:  Constitutional:  -- Normal overweight body habitus  -- Awake, alert, and oriented x3  Eyes:  -- Pupils equally round and reactive to light  -- No scleral icterus  Ear, nose, throat:  -- No jugular venous distension -- No nasal drainage, bleeding Pulmonary:  -- No crackles  -- Equal breath sounds bilaterally -- Breathing non-labored at rest Cardiovascular:  -- S1, S2 present  -- No pericardial rubs  Gastrointestinal:  -- Abdomen soft, nontender, nondistended, no guarding/rebound  -- No abdominal masses appreciated, pulsatile or otherwise  Musculoskeletal and Integumentary:  -- Wounds or skin discoloration: 1 cm x 1 cm mildly tender mid-posterior neck mass along patient's hairline with overall short hair, no erythema or drainage -- Extremities: B/L UE and LE FROM, hands and feet warm, no edema  Neurologic:  -- Motor function: Intact and symmetric -- Sensation: Intact and symmetric  Labs: CBC: No results found for: WBC, RBC BMP: No results found for: GLUCOSE, POTASSIUM, CHLORIDE, CO2, BUN, CREATININE, CALCIUM   Assessment/Plan:  26 y.o. female with a 1 cm x 1 cm tender mid-posterior neck mass, consistent with likely a sebaceous cyst, without evidence of infection or prior known infection and complicated by co-morbidities including obesity (BMI 30), pre-diabetes (HgbA1C 5.8, 2016), asthma, polycystic ovarian syndrome, migraine headaches, and major depression disorder.   - all risks, benefits, and alternatives to excision of tender mid-posterior neck subcutaneous mass were discussed with the patient, all of her questions were answered to her expressed satisfaction, patient expresses she wishes to proceed, and informed consent was obtained.  - will plan for elective excision of mid-posterior neck mass in 1 month per patient request on Friday, August 24 at 11 am  - anticipate return to clinic 2 weeks following above elective procedure  - instructed  patient to call if any questions or concerns  All of the above recommendations were discussed with the patient, and all of patient's questions were answered to her expressed satisfaction.  Thank you for the opportunity to participate in this patient's care.  -- Scherrie GerlachJason E. Earlene Plateravis, MD, RPVI [place]: [facility] General [facility] - Partnering for exceptional care. Office: [phone]

## 2017-07-06 NOTE — Patient Instructions (Signed)
We will excise your sebaceous cyst in the office on 08/11/17 at 1045am. You may drive yourself to this appointment. The injection to numb the area should not interfere with your fertility medications.  Please call our office with any questions or concerns prior to your procedure scheduled.

## 2017-07-11 ENCOUNTER — Other Ambulatory Visit: Payer: BLUE CROSS/BLUE SHIELD

## 2017-07-11 DIAGNOSIS — N970 Female infertility associated with anovulation: Secondary | ICD-10-CM

## 2017-07-12 LAB — PROGESTERONE: Progesterone: 1.1 ng/mL

## 2017-07-13 ENCOUNTER — Other Ambulatory Visit: Payer: Self-pay | Admitting: Obstetrics and Gynecology

## 2017-07-13 DIAGNOSIS — N970 Female infertility associated with anovulation: Secondary | ICD-10-CM

## 2017-07-13 DIAGNOSIS — E282 Polycystic ovarian syndrome: Secondary | ICD-10-CM

## 2017-07-14 ENCOUNTER — Other Ambulatory Visit: Payer: Self-pay | Admitting: Obstetrics and Gynecology

## 2017-07-14 ENCOUNTER — Other Ambulatory Visit: Payer: BLUE CROSS/BLUE SHIELD

## 2017-07-14 DIAGNOSIS — E282 Polycystic ovarian syndrome: Secondary | ICD-10-CM

## 2017-07-14 DIAGNOSIS — N970 Female infertility associated with anovulation: Secondary | ICD-10-CM

## 2017-07-15 LAB — PROGESTERONE: PROGESTERONE: 7.4 ng/mL

## 2017-07-19 HISTORY — PX: OTHER SURGICAL HISTORY: SHX169

## 2017-07-26 ENCOUNTER — Other Ambulatory Visit: Payer: Self-pay | Admitting: Obstetrics and Gynecology

## 2017-07-26 ENCOUNTER — Telehealth: Payer: Self-pay | Admitting: Obstetrics and Gynecology

## 2017-07-26 DIAGNOSIS — N970 Female infertility associated with anovulation: Secondary | ICD-10-CM

## 2017-07-26 MED ORDER — LETROZOLE 2.5 MG PO TABS: 5.0000 mg | ORAL_TABLET | Freq: Every day | ORAL | 0 refills | Status: DC

## 2017-07-26 MED ORDER — LETROZOLE 2.5 MG PO TABS: 5.0000 mg | ORAL_TABLET | Freq: Every day | ORAL | 0 refills | Status: AC

## 2017-07-26 NOTE — Telephone Encounter (Signed)
-----   Message from Conard NovakStephen D Jackson, MD sent at 07/26/2017 11:06 AM EDT ----- Regarding: HSG Would you mind scheduling an HSG for this patient. Her LMP was 8/7.  So, should be set up by 8/16.  Thank you!

## 2017-07-26 NOTE — Telephone Encounter (Signed)
I sent the following message to Dr. Jean RosenthalJackson: The only OR day you have between days 6-10 is next Tuesday and you already have an HSG scheduled. They only do one per day. You're in the OR on 8/17 if that would be okay. I spoke with Dr. Jean RosenthalJackson on the phone who said 8/17 would be fine for this patient. Patient [facility]17 [facility] 1[facility]30p [facility] Lmtrc.

## 2017-07-26 NOTE — Telephone Encounter (Signed)
Patient returned call but will be out of town on 08/04/17. Patient said she wants to keep the appt, doesn't want to cancel, will call husband and they will make it work. Patient has my ext.

## 2017-07-26 NOTE — Telephone Encounter (Signed)
-----   Message from Stephen D Jackson, MD sent at 07/26/2017 11:06 AM EDT ----- °Regarding: HSG °Would you mind scheduling an HSG for this patient. Her LMP was 8/7.  So, should be set up by 8/16.  Thank you! °

## 2017-08-04 ENCOUNTER — Ambulatory Visit
Admission: RE | Admit: 2017-08-04 | Discharge: 2017-08-04 | Disposition: A | Discharge status: Home / Self Care | Payer: BLUE CROSS/BLUE SHIELD | Source: Ambulatory Visit | Attending: Obstetrics and Gynecology | Admitting: Obstetrics and Gynecology

## 2017-08-04 DIAGNOSIS — N970 Female infertility associated with anovulation: Secondary | ICD-10-CM | POA: Insufficient documentation

## 2017-08-04 MED ORDER — IOPAMIDOL (ISOVUE-370) INJECTION 76%
50.0000 mL | Freq: Once | INTRAVENOUS | Status: AC | PRN
  Administered 2017-08-04: 16 mL

## 2017-08-04 NOTE — Procedures (Signed)
Drape and prep done.  Cervix normal.  Tenaculum placed. Cervix dilated minimally.  Catheter placed into uterine cavity. Approximately 15 mL dye injected under fluoroscopic visualization (IsoVue 370).  Uterus appeared normal. Patent bilateral fallopian tube seen.  Catheter and tenaculum removed.  Pt stable, tolerated procedure well.   Await final radiology read.  Thomasene Mohair, MD 08/04/2017 2:25 PM

## 2017-08-09 ENCOUNTER — Other Ambulatory Visit: Payer: Self-pay | Admitting: Obstetrics and Gynecology

## 2017-08-09 DIAGNOSIS — N970 Female infertility associated with anovulation: Secondary | ICD-10-CM

## 2017-08-11 ENCOUNTER — Other Ambulatory Visit: Payer: Self-pay | Admitting: Surgery

## 2017-08-11 ENCOUNTER — Encounter: Payer: Self-pay | Admitting: Surgery

## 2017-08-11 ENCOUNTER — Telehealth: Payer: Self-pay

## 2017-08-11 ENCOUNTER — Ambulatory Visit (INDEPENDENT_AMBULATORY_CARE_PROVIDER_SITE_OTHER): Payer: BLUE CROSS/BLUE SHIELD | Admitting: Surgery

## 2017-08-11 VITALS — BP 118/78 | HR 73 | Temp 98.0°F | Ht 68.0 in | Wt 199.4 lb

## 2017-08-11 DIAGNOSIS — L723 Sebaceous cyst: Secondary | ICD-10-CM | POA: Diagnosis not present

## 2017-08-11 NOTE — Patient Instructions (Addendum)
You may remove the dressing in two days. You may shower as normal. Steri-strips were placed and will fall off on their own.   If you have an pain you may take ibuprofen, advil, etc.   If you develop any server pain, drainage, redness, fever, etc please give our office a call.  We will follow up with you as listed below:

## 2017-08-11 NOTE — Telephone Encounter (Signed)
Specimen placed in [facility] box.  Spoke with Kennith Center at [facility] and told her that I needed a specimen picked up. She transferred me to [place]. I spoke with Liborio Nixon and told her that I needed a specimen picked up here at the office. She asked for my account number which I gave her and she asked if I was going to place it in the lock box. I ensured her that I would and she verbalized that she would let the pick up guy know to stop by. I thanked her.

## 2017-08-12 NOTE — Progress Notes (Signed)
SURGICAL OPERATIVE REPORT  DATE OF PROCEDURE: 08/12/2017  ATTENDING: Barbara Cower E. Earlene Plater, MD  ANESTHESIA: Local  PRE-OPERATIVE DIAGNOSIS: Symptomatic (painful) sebaceous cyst of the scalp without prior infection (icd-10: L72.3)  POST-OPERATIVE DIAGNOSIS: Symptomatic (painful) sebaceous cyst of the scalp without prior infection (icd-10: L72.3)  PROCEDURE(S):  1.) Excision of symptomatic (painful) sebaceous cyst of the scalp (cpt: 21011)  INTRAOPERATIVE FINDINGS: 1 cm x 1 cm non-infected subcutaneous mass of the posterior scalp (most likely a sebaceous cyst), removed intact  INTRAVENOUS FLUIDS: 0 mL crystalloid   ESTIMATED BLOOD LOSS: Minimal (< 20 mL)  URINE OUTPUT: No Foley  SPECIMENS: 1 cm x 1 cm subcutaneous mass of the posterior scalp (most likely a sebaceous cyst), removed intact  IMPLANTS: None  DRAINS: None  COMPLICATIONS: None apparent  CONDITION AT END OF PROCEDURE: Hemodynamically stable and awake  DISPOSITION OF PATIENT: PACU  INDICATIONS FOR PROCEDURE:  Patient is a 26 y.o. female who presented for a symptomatic (painful)  sebaceous cyst of the posterior scalp without prior infection. Patient reports the cyst periodically grows in size and then regresses, and it recently has become increasingly painful, especially with combing or brushing her hair. Patient requested that it be removed, andpatient was accordingly referred for surgical evaluation and management.All risks, benefits, and alternatives to above procedure were discussed with the patient, all of patient's questions were answered to his expressed satisfaction, and informed consent was obtained and documented.  DETAILS OF PROCEDURE: Patient was brought to the procedure suite and was appropriately identified. Laying on the procedural table with her Right side down, operative site was prepped and draped in the usual sterile fashion, and following a brief time out, a 1.5 cm long and 1 cm wide transverse  elliptical incision was made using a #15 blade scalpel, and incision was extended deep around subcutaneous mass using sharp + blunt dissection and direct pressure for hemostasis. During the course of dissecting free the cyst, it was not disrupted and was removed intact. Direct pressure was applied for hemostasis. Further examination did not reveal any additional cyst. Hemostasis was achieved, and the wound was copiously irrigated with unused local anesthetic. Deep tissue and dermis were re-approximated using buried interrupted 3-0 Vicryl suture. Skin was then cleaned and dried, sterile Steristrips were applied to the wound, and a 2 x 2" gauze and adhesive tap dressing was applied. Patient was then safely transferred to recovery for post-procedural monitoring and care.  I was present for all aspects of the above procedure, and there were no complications apparent.

## 2017-08-14 ENCOUNTER — Other Ambulatory Visit: Payer: BLUE CROSS/BLUE SHIELD

## 2017-08-14 DIAGNOSIS — N970 Female infertility associated with anovulation: Secondary | ICD-10-CM

## 2017-08-15 LAB — PROGESTERONE: Progesterone: 6.3 ng/mL

## 2017-08-16 LAB — PATHOLOGY

## 2017-08-22 ENCOUNTER — Encounter: Payer: Self-pay | Admitting: Surgery

## 2017-08-22 ENCOUNTER — Ambulatory Visit (INDEPENDENT_AMBULATORY_CARE_PROVIDER_SITE_OTHER): Payer: BLUE CROSS/BLUE SHIELD | Admitting: Surgery

## 2017-08-22 VITALS — BP 134/80 | HR 83 | Temp 98.7°F | Wt 199.0 lb

## 2017-08-22 DIAGNOSIS — Z872 Personal history of diseases of the skin and subcutaneous tissue: Secondary | ICD-10-CM

## 2017-08-22 NOTE — Progress Notes (Signed)
Surgical Clinic Progress/Follow-up Note   HPI:  26 y.o. Female presents to clinic for post-op follow-up evaluation 2 weeks s/p excision of posterior neck/scalp mass. Patient reports she is doing well, denies any pain, fever/chills, N/V, CP, or SOB, and only complains that the blue-colored prep used to sterilize the operative site took a few days to wash out of her hair once the dressing was removed.  Review of Systems:  Constitutional: denies any other weight loss, fever, chills, or sweats  Eyes: denies any other vision changes, history of eye injury  ENT: denies sore throat, hearing problems  Respiratory: denies shortness of breath, wheezing  Cardiovascular: denies chest pain, palpitations  Gastrointestinal: denies abdominal pain, N/V, and bowel function as per HPI Musculoskeletal: denies any other joint pains or cramps  Skin: Denies any other rashes or skin discolorations  Neurological: denies any other headache, dizziness, weakness  Psychiatric: denies any other depression, anxiety  All other review of systems: otherwise negative   Vital Signs:  BP 134/80   Pulse 83   Temp 98.7 F (37.1 C) (Oral)   Wt 199 lb (90.3 kg)   LMP 07/25/2017   BMI 30.26 kg/m    Physical Exam:  Constitutional:  -- Normal body habitus  -- Awake, alert, and oriented x3  Eyes:  -- Pupils equally round and reactive to light  -- No scleral icterus  Ear, nose, throat:  -- No jugular venous distension  -- No nasal drainage, bleeding Pulmonary:  -- No crackles -- Equal breath sounds bilaterally -- Breathing non-labored at rest Cardiovascular:  -- S1, S2 present  -- No pericardial rubs  Gastrointestinal:  -- Soft, nontender, nondistended, no guarding/rebound  -- No abdominal masses appreciated, pulsatile or otherwise  Musculoskeletal / Integumentary:  -- Wounds or skin discoloration: well-approximated, NT transverse posterior neck/scalp wound with no erythema or drainage -- Extremities: B/L UE  and LE FROM, hands and feet warm, no edema  Neurologic:  -- Motor function: intact and symmetric  -- Sensation: intact and symmetric   Assessment:  26 y.o. yo Female with a problem list including...  Patient Active Problem List   Diagnosis Date Noted  . Infertility, anovulation 03/10/2017  . PCOS (polycystic ovarian syndrome) 07/08/2016  . Prediabetes 07/08/2016  . Vitamin D deficiency 07/08/2016  . Headache, migraine 07/14/2015    presents to clinic for post-op follow-up evaluation, doing well s/p excision of posterior neck/scalp sebaceous cyst.  Plan:   - pathology results discussed  - wound care reviewed, okay to shower with soap/shamppo and water  - return to clinic as needed, call office if any questions or concerns  All of the above recommendations were discussed with the patient, and all of patient's questions were answered to her expressed satisfaction.  -- Scherrie GerlachJason E. Earlene Plateravis, MD, RPVI [place]: [facility] - Partnering for exceptional care. Office: [phone]

## 2017-08-22 NOTE — Patient Instructions (Signed)
Please give us a call in case you have any questions or concerns.   Daisey MustGood Luck getting pregnant! Remember to ask Jeani HawkingSanta Claus for a beautiful healthy baby! Don't get discouraged. Practice makes perfection ;)   It took me 13 years to have a beautiful healthy baby boy...he is now 3 :)

## 2017-08-28 ENCOUNTER — Other Ambulatory Visit: Payer: Self-pay | Admitting: Obstetrics and Gynecology

## 2017-08-28 DIAGNOSIS — N970 Female infertility associated with anovulation: Secondary | ICD-10-CM

## 2017-08-28 DIAGNOSIS — E282 Polycystic ovarian syndrome: Secondary | ICD-10-CM

## 2017-08-28 MED ORDER — LETROZOLE 2.5 MG PO TABS: 5.0000 mg | ORAL_TABLET | Freq: Every day | ORAL | 0 refills | Status: DC

## 2017-08-30 ENCOUNTER — Ambulatory Visit: Payer: Self-pay | Admitting: General Surgery

## 2017-09-18 ENCOUNTER — Other Ambulatory Visit: Payer: BLUE CROSS/BLUE SHIELD

## 2017-09-18 DIAGNOSIS — N970 Female infertility associated with anovulation: Secondary | ICD-10-CM

## 2017-09-18 DIAGNOSIS — E282 Polycystic ovarian syndrome: Secondary | ICD-10-CM

## 2017-09-19 LAB — PROGESTERONE: Progesterone: 11.6 ng/mL

## 2017-09-25 ENCOUNTER — Other Ambulatory Visit: Payer: Self-pay | Admitting: Obstetrics and Gynecology

## 2017-09-25 DIAGNOSIS — N970 Female infertility associated with anovulation: Secondary | ICD-10-CM

## 2017-09-25 DIAGNOSIS — E282 Polycystic ovarian syndrome: Secondary | ICD-10-CM

## 2017-09-25 NOTE — Progress Notes (Signed)
After several ovulation cycles on letrozole 5 mg, patient elects to see REI.  Will refer her to [facility]. She has undergone HSG. No semen analysis has yet been done.   Thomasene Mohair, MD 09/25/2017 5:50 PM

## 2017-09-27 ENCOUNTER — Other Ambulatory Visit: Payer: Self-pay | Admitting: Obstetrics and Gynecology

## 2017-09-27 DIAGNOSIS — E282 Polycystic ovarian syndrome: Secondary | ICD-10-CM

## 2017-09-27 DIAGNOSIS — N970 Female infertility associated with anovulation: Secondary | ICD-10-CM

## 2017-09-27 MED ORDER — LETROZOLE 2.5 MG PO TABS: 5.0000 mg | ORAL_TABLET | Freq: Every day | ORAL | 0 refills | Status: DC

## 2017-09-29 ENCOUNTER — Encounter: Payer: Self-pay | Admitting: Surgery

## 2017-10-30 ENCOUNTER — Other Ambulatory Visit: Payer: Self-pay | Admitting: Obstetrics and Gynecology

## 2017-10-30 DIAGNOSIS — N970 Female infertility associated with anovulation: Secondary | ICD-10-CM

## 2017-10-30 MED ORDER — LETROZOLE 2.5 MG PO TABS: 5.0000 mg | ORAL_TABLET | Freq: Every day | ORAL | 0 refills | Status: DC

## 2017-11-03 ENCOUNTER — Other Ambulatory Visit: Payer: Self-pay | Admitting: Internal Medicine

## 2017-11-06 ENCOUNTER — Encounter: Payer: Self-pay | Admitting: Internal Medicine

## 2017-11-06 ENCOUNTER — Ambulatory Visit: Payer: BLUE CROSS/BLUE SHIELD | Admitting: Internal Medicine

## 2017-11-06 VITALS — BP 112/74 | HR 99 | Ht 68.0 in | Wt 199.0 lb

## 2017-11-06 DIAGNOSIS — J4520 Mild intermittent asthma, uncomplicated: Secondary | ICD-10-CM | POA: Diagnosis not present

## 2017-11-06 DIAGNOSIS — F331 Major depressive disorder, recurrent, moderate: Secondary | ICD-10-CM | POA: Diagnosis not present

## 2017-11-06 MED ORDER — ALBUTEROL SULFATE HFA 108 (90 BASE) MCG/ACT IN AERS: 2.0000 | INHALATION_SPRAY | Freq: Four times a day (QID) | RESPIRATORY_TRACT | 0 refills | Status: DC | PRN

## 2017-11-06 NOTE — Progress Notes (Signed)
Date:  11/06/2017   Name:  Brianna AskewChristine S Marshall   DOB:  04/25/1991   MRN:  [id number]   Chief Complaint: Asthma (Refill on inhaler. proventil. ) Asthma  She complains of chest tightness. There is no cough, shortness of breath or wheezing. The current episode started more than 1 year ago. The problem occurs rarely. Pertinent negatives include no chest pain, fever or headaches. Her symptoms are aggravated by change in weather, exercise and exposure to fumes. Her symptoms are alleviated by beta-agonist. Her past medical history is significant for asthma.  Depression         This is a chronic problem.  The onset quality is undetermined. The problem is unchanged.  Associated symptoms include no fatigue and no headaches.  Past treatments include SSRIs - Selective serotonin reuptake inhibitors (off meds because she is trying to get pregnant).     Review of Systems  Constitutional: Negative for chills, fatigue and fever.  Respiratory: Negative for cough, shortness of breath and wheezing.   Cardiovascular: Negative for chest pain, palpitations and leg swelling.  Gastrointestinal: Negative for abdominal pain.  Neurological: Negative for dizziness and headaches.  Psychiatric/Behavioral: Positive for depression and dysphoric mood (stable).    Patient Active Problem List   Diagnosis Date Noted  . Infertility, anovulation 03/10/2017  . PCOS (polycystic ovarian syndrome) 07/08/2016  . Prediabetes 07/08/2016  . Vitamin D deficiency 07/08/2016  . Headache, migraine 07/14/2015    Prior to Admission medications   Medication Sig Start Date End Date Taking? Authorizing Provider  letrozole [facility](FEMARA) 2.5 MG tablet Take 2 tablets (5 mg total) daily by mouth. 10/30/17  Yes Conard NovakJackson, Stephen D, MD    Allergies  Allergen Reactions  . Shellfish Allergy Nausea And Vomiting  . Amoxicillin Rash    Past Surgical History:  Procedure Laterality Date  . INCISE AND DRAIN ABCESS Right 2011   Posterior  Thigh  . sebaceous cyst removal  07/2017   Back of neck    Social History   Tobacco Use  . Smoking status: Never Smoker  . Smokeless tobacco: Never Used  Substance Use Topics  . Alcohol use: Yes    Alcohol/week: 0.0 oz    Comment: Occasional Wine  . Drug use: No   Medication list has been reviewed and updated.  PHQ 2/9 Scores 11/06/2017 07/08/2016  PHQ - 2 Score 3 1  PHQ- 9 Score 11 -  Exception Documentation - Other- indicate reason in comment box  Not completed - follows therapist    Physical Exam  Constitutional: She is oriented to person, place, and time. She appears well-developed. No distress.  HENT:  Head: Normocephalic and atraumatic.  Neck: Normal range of motion. Neck supple. Carotid bruit is not present. No thyromegaly present.  Cardiovascular: Normal rate, regular rhythm and normal heart sounds.  Pulmonary/Chest: Effort normal and breath sounds normal. No respiratory distress. She has no wheezes. She has no rales.  Musculoskeletal: Normal range of motion.  Neurological: She is alert and oriented to person, place, and time.  Skin: Skin is warm and dry. No rash noted.  Psychiatric: She has a normal mood and affect. Her speech is normal and behavior is normal. Thought content normal.  Nursing note and vitals reviewed.   BP 112/74   Pulse 99   Ht 5\' 8"  (1.727 m)   Wt 199 lb (90.3 kg)   SpO2 97%   BMI 30.26 kg/m   Assessment and Plan: 1. Mild intermittent asthma without complication Continue  MDI PRN - albuterol (PROVENTIL HFA;VENTOLIN HFA) 108 (90 Base) MCG/ACT inhaler; Inhale 2 puffs every 6 (six) hours as needed into the lungs for wheezing or shortness of breath.  Dispense: 1 Inhaler; Refill: 0  2. Major depressive disorder, recurrent episode, moderate (HCC) Stable off of medications for fertility workup   Meds ordered this encounter  Medications  . albuterol (PROVENTIL HFA;VENTOLIN HFA) 108 (90 Base) MCG/ACT inhaler    Sig: Inhale 2 puffs every 6  (six) hours as needed into the lungs for wheezing or shortness of breath.    Dispense:  1 Inhaler    Refill:  0    Dispense which ever albuterol MDI is covered    Partially dictated using Animal nutritionistDragon software. Any errors are unintentional.  Bari EdwardLaura Jahsiah Carpenter, MD [facility]  11/06/2017

## 2017-11-08 ENCOUNTER — Encounter: Payer: Self-pay | Admitting: Internal Medicine

## 2017-11-18 LAB — PROGESTERONE: PROGESTERONE: 19.7 ng/mL

## 2017-11-29 ENCOUNTER — Other Ambulatory Visit: Payer: Self-pay | Admitting: Internal Medicine

## 2017-11-29 DIAGNOSIS — J4520 Mild intermittent asthma, uncomplicated: Secondary | ICD-10-CM

## 2017-12-19 NOTE — L&D Delivery Note (Signed)
Delivery Note At 3:49 AM a viable female infant was delivered via Vaginal, Spontaneous (Presentation: ROA).  APGAR: 7, 9; weight 9 lb 5.2 oz (4230 g).   Placenta status: delivered spontaneously, intact.  Cord: 3VC with the following complications: None.  Cord pH: n/a  Anesthesia:  epidural Episiotomy: None Lacerations: 3rd degree (small capsule disruption, no apparent sphincteric disruption);Perineal Suture Repair: 2.0 vicryl Est. Blood Loss (mL): 550  Mom to postpartum.  Baby to Couplet care / Skin to Skin.  Called to see patient.  Mom pushed to deliver a viable female infant.  The head followed by shoulders, which delivered without difficulty, and the rest of the body.  No nuchal cord noted.  Baby to mom's chest.  Cord clamped and cut after > 1 min delay.  Cord blood obtained.  Placenta delivered spontaneously, intact, with a 3-vessel cord.  Third degree perineal laceration repaired with 2-0 Vicryl in standard fashion.  Third degree called due to small disruption in the capsule of the sphincter. This was re-enforced during the closure.  Gloved finger placed in the rectum at the end of the repair to ensure no stitches entered the rectal mucosa.  All counts correct.  Hemostasis obtained with IV pitocin and fundal massage. EBL 550 mL.     Thomasene MohairStephen Shareen Capwell, MD 07/30/2018, 4:33 AM

## 2018-01-02 ENCOUNTER — Encounter: Payer: Self-pay | Admitting: Obstetrics and Gynecology

## 2018-01-02 ENCOUNTER — Ambulatory Visit (INDEPENDENT_AMBULATORY_CARE_PROVIDER_SITE_OTHER): Payer: BLUE CROSS/BLUE SHIELD | Admitting: Obstetrics and Gynecology

## 2018-01-02 VITALS — BP 124/74 | Wt 196.0 lb

## 2018-01-02 DIAGNOSIS — O0990 Supervision of high risk pregnancy, unspecified, unspecified trimester: Secondary | ICD-10-CM

## 2018-01-02 DIAGNOSIS — F324 Major depressive disorder, single episode, in partial remission: Secondary | ICD-10-CM | POA: Insufficient documentation

## 2018-01-02 DIAGNOSIS — O99340 Other mental disorders complicating pregnancy, unspecified trimester: Secondary | ICD-10-CM

## 2018-01-02 DIAGNOSIS — E8881 Metabolic syndrome: Secondary | ICD-10-CM

## 2018-01-02 DIAGNOSIS — Z3A09 9 weeks gestation of pregnancy: Secondary | ICD-10-CM

## 2018-01-02 DIAGNOSIS — E282 Polycystic ovarian syndrome: Secondary | ICD-10-CM

## 2018-01-02 DIAGNOSIS — F329 Major depressive disorder, single episode, unspecified: Secondary | ICD-10-CM

## 2018-01-02 DIAGNOSIS — F32A Depression, unspecified: Secondary | ICD-10-CM

## 2018-01-02 HISTORY — DX: Supervision of high risk pregnancy, unspecified, unspecified trimester: O09.90

## 2018-01-02 LAB — OB RESULTS CONSOLE GC/CHLAMYDIA
Chlamydia: NEGATIVE
GC PROBE AMP, GENITAL: NEGATIVE

## 2018-01-02 NOTE — Progress Notes (Signed)
New Obstetric Patient H&P   Chief Complaint: "Desires prenatal care"   History of Present Illness: Patient is a 27 y.o. G1P0 Not Hispanic or Latino female, LMP 10/28/2017 presents with amenorrhea and positive home pregnancy test. Based on her  LMP, her EDD is Estimated Date of Delivery: 08/04/18 and her EGA is 1542w3d.  Patient achieved pregnancy with some injectable hormones through an REI. She has had two ultrasounds confirming her EDD and viability of the singleton pregnancy.    Since her LMP she claims she has experienced some mild depression/anxiety symptoms. She denies vaginal bleeding. Her past medical history is notable for asthma (not active), depression (on no meds), PCOS with some evidence of insulin resistance with an elevated hemoglobin a1c. She has had no prior pregnancies.  At her REI, she had multiple carrier screening tests done, including SMA, CF, fragile X (she states she has been diagnosed with Asperger Syndrome). All of these and the others were negative.   Since her LMP, she admits to the use of tobacco products  no She claims she has gained zero pounds since the start of her pregnancy.  There are cats in the home in the home  yes If yes Indoor and her husband changes the litter. She admits close contact with children on a regular basis  yes  She has had chicken pox in the past yes She has had Tuberculosis exposures, symptoms, or previously tested positive for TB   no Current or past history of domestic violence. no  Genetic Screening/Teratology Counseling: (Includes patient, baby's father, or anyone in either family with:)   1. [facility]  no 2. Thalassemia ([place], [place], Mediterranean, or Asian background): MCV<80  no 3. Neural tube defect (meningomyelocele, spina bifida, anencephaly)  no 4. Congenital heart defect  no  5. Down syndrome  no 6. Tay-Sachs (Jewish, [place])  no 7. Canavan's Disease  no 8. Sickle cell disease or trait (African)   no  9. Hemophilia or other blood disorders  no  10. Muscular dystrophy  no  11. Cystic fibrosis  no  12. Huntington's Chorea  no  13. Mental retardation/autism  yes 14. Other inherited genetic or chromosomal disorder  no 15. Maternal metabolic disorder (DM, PKU, etc)  no 16. Patient or FOB with a child with a birth defect not listed above no  16a. Patient or FOB with a birth defect themselves no 17. Recurrent pregnancy loss, or stillbirth  no  18. Any medications since LMP other than prenatal vitamins (include vitamins, supplements, OTC meds, drugs, alcohol)  Yes - she has been taking zofran for nausea. This was prescribed by the REI she saw. She could not afford Diclegis/Bonjesta. 19. Any other genetic/environmental exposure to discuss  no  Infection History:   1. Lives with someone with TB or TB exposed  no  2. Patient or partner has history of genital herpes  no 3. Rash or viral illness since LMP  no 4. History of STI (GC, CT, HPV, syphilis, HIV)  no 5. History of recent travel :  no  Other pertinent information:  no   Review of Systems:10 point review of systems negative unless otherwise noted in HPI  Past Medical History:  Diagnosis Date  . Asthma   . Depression   . Headache, migraine 07/14/2015  . Infertility, anovulation 03/10/2017  . Obesity   . PCOS (polycystic ovarian syndrome) 07/08/2016  . Prediabetes 07/08/2016   Hgb 5.8  04/2015   . Vitamin  D deficiency 07/08/2016   18.7 in 04/2015     Past Surgical History:  Procedure Laterality Date  . INCISE AND DRAIN ABCESS Right 2011   Posterior Thigh  . sebaceous cyst removal  07/2017   Back of neck    Gynecologic History: Patient's last menstrual period was 10/28/2017.  Obstetric History: G1P0  Family History  Problem Relation Age of Onset  . Osteoporosis Mother   . Alcohol abuse Father   . Drug abuse Father   . Alcohol abuse Maternal Aunt   . Bipolar disorder Maternal Aunt   . Depression Paternal Grandmother      Social History   Socioeconomic History  . Marital status: Married    Spouse name: Not on file  . Number of children: Not on file  . Years of education: Not on file  . Highest education level: Not on file  Social Needs  . Financial resource strain: Not on file  . Food insecurity - worry: Not on file  . Food insecurity - inability: Not on file  . Transportation needs - medical: Not on file  . Transportation needs - non-medical: Not on file  Occupational History  . Not on file  Tobacco Use  . Smoking status: Never Smoker  . Smokeless tobacco: Never Used  Substance and Sexual Activity  . Alcohol use: Yes    Alcohol/week: 0.0 oz    Comment: Occasional Wine  . Drug use: No  . Sexual activity: Yes    Birth control/protection: None  Other Topics Concern  . Not on file  Social History Narrative  . Not on file    Allergies  Allergen Reactions  . Shellfish Allergy Nausea And Vomiting  . Amoxicillin Rash    Prior to Admission medications   Medication Sig Start Date End Date Taking? Authorizing Provider  ondansetron (ZOFRAN) 4 MG tablet TK 1 T PO BID PRN NV 12/21/17  Yes [provider]  Prenatal Multivit-Min-Fe-FA (PRENATAL VITAMINS PO) Take by mouth.   Yes [provider]    Physical Exam BP 124/74   Wt 196 lb (88.9 kg)   LMP 10/28/2017   BMI 29.80 kg/m   Physical Exam  Constitutional: She is oriented to person, place, and time. She appears well-developed and well-nourished. No distress.  HENT:  Head: Normocephalic and atraumatic.  Eyes: Conjunctivae are normal.  Neck: Normal range of motion. Neck supple. No thyromegaly present.  Cardiovascular: Normal rate, regular rhythm and normal heart sounds. Exam reveals no gallop and no friction rub.  No murmur heard. Pulmonary/Chest: Effort normal and breath sounds normal. She has no wheezes.  Abdominal: Soft. She exhibits no distension and no mass. There is no tenderness. There is no rebound and no  guarding. No hernia. Hernia confirmed negative in the right inguinal area and confirmed negative in the left inguinal area.  Genitourinary: Vagina normal. Pelvic exam was performed with patient supine. There is no rash, tenderness or lesion on the right labia. There is no rash, tenderness or lesion on the left labia. Uterus is enlarged (mildly enlarged). Uterus is not deviated, not fixed and not tender. Cervix exhibits no motion tenderness, no discharge and no friability. Right adnexum displays no mass, no tenderness and no fullness. Left adnexum displays no mass, no tenderness and no fullness.  Musculoskeletal: Normal range of motion.  Lymphadenopathy:       Right: No inguinal adenopathy present.       Left: No inguinal adenopathy present.  Neurological: She is alert and  oriented to person, place, and time.  Skin: Skin is warm and dry. No rash noted.  Psychiatric: She has a normal mood and affect. Her behavior is normal. Judgment normal.    Female Chaperone present during breast and/or pelvic exam.  EPDS: 21 (scored a 1 on #10, but states she is occasionally tempted to "cut" herself, but denies any SI/HI)  Assessment: 27 y.o. G1P0 at [redacted]w[redacted]d presenting to initiate prenatal care  Plan: 1) Avoid alcoholic beverages. 2) Patient encouraged not to smoke.  3) Discontinue the use of all non-medicinal drugs and chemicals.  4) Take prenatal vitamins daily.  5) Nutrition, food safety (fish, cheese advisories, and high nitrite foods) and exercise discussed. 6) Hospital and practice style discussed with cross coverage system.  7) Genetic Screening, such as with 1st Trimester Screening, cell free fetal DNA, AFP testing, and Ultrasound, as well as with amniocentesis and CVS as appropriate, is discussed with patient. At the conclusion of today's visit patient requested genetic testing 8) Patient is asked about travel to areas at risk for the Zika virus, and counseled to avoid travel and exposure to mosquitoes  or sexual partners who may have themselves been exposed to the virus. Testing is discussed, and will be ordered as appropriate.  9) insulin resistance: 1 hour glucose tolerance test next visit 10) depression: monitor closely. Consider starting zoloft second trimester  Return in about 2 weeks (around 01/16/2018) for schedule u/s for NT screen, 1h gtt with labs, and routine prenatal.  Thomasene Mohair, MD 01/02/2018 9:39 AM

## 2018-01-04 LAB — URINE CULTURE

## 2018-01-04 LAB — IGP, CTNG, RFX APTIMA HPV ASCU
Chlamydia, Nuc. Acid Amp: NEGATIVE
GONOCOCCUS BY NUCLEIC ACID AMP: NEGATIVE
PAP SMEAR COMMENT: 0

## 2018-01-16 ENCOUNTER — Encounter: Payer: Self-pay | Admitting: Obstetrics and Gynecology

## 2018-01-16 ENCOUNTER — Ambulatory Visit (INDEPENDENT_AMBULATORY_CARE_PROVIDER_SITE_OTHER): Payer: BLUE CROSS/BLUE SHIELD | Admitting: Obstetrics and Gynecology

## 2018-01-16 ENCOUNTER — Other Ambulatory Visit: Payer: BLUE CROSS/BLUE SHIELD

## 2018-01-16 ENCOUNTER — Ambulatory Visit (INDEPENDENT_AMBULATORY_CARE_PROVIDER_SITE_OTHER): Payer: BLUE CROSS/BLUE SHIELD

## 2018-01-16 VITALS — BP 120/70 | Wt 195.0 lb

## 2018-01-16 DIAGNOSIS — E282 Polycystic ovarian syndrome: Secondary | ICD-10-CM

## 2018-01-16 DIAGNOSIS — O0990 Supervision of high risk pregnancy, unspecified, unspecified trimester: Secondary | ICD-10-CM | POA: Diagnosis not present

## 2018-01-16 DIAGNOSIS — F32A Depression, unspecified: Secondary | ICD-10-CM

## 2018-01-16 DIAGNOSIS — Z3A11 11 weeks gestation of pregnancy: Secondary | ICD-10-CM

## 2018-01-16 DIAGNOSIS — E8881 Metabolic syndrome: Secondary | ICD-10-CM

## 2018-01-16 DIAGNOSIS — Z362 Encounter for other antenatal screening follow-up: Secondary | ICD-10-CM

## 2018-01-16 DIAGNOSIS — O99340 Other mental disorders complicating pregnancy, unspecified trimester: Secondary | ICD-10-CM

## 2018-01-16 DIAGNOSIS — R350 Frequency of micturition: Secondary | ICD-10-CM

## 2018-01-16 DIAGNOSIS — F329 Major depressive disorder, single episode, unspecified: Secondary | ICD-10-CM

## 2018-01-16 DIAGNOSIS — O219 Vomiting of pregnancy, unspecified: Secondary | ICD-10-CM

## 2018-01-16 LAB — OB RESULTS CONSOLE VARICELLA ZOSTER ANTIBODY, IGG: Varicella: IMMUNE

## 2018-01-16 NOTE — Progress Notes (Signed)
  Routine Prenatal Care Visit  Subjective  Brianna Marshall is a 27 y.o. G1P0 at 7575w4d being seen today for ongoing prenatal care.  She is currently monitored for the following issues for this high-risk pregnancy and has Headache, migraine; PCOS (polycystic ovarian syndrome); Prediabetes; Vitamin D deficiency; Infertility, anovulation; Supervision of high risk pregnancy, antepartum; Insulin resistance; and Depression affecting pregnancy on their problem list.  ----------------------------------------------------------------------------------- Patient reports urinary frequency since last visit with mild dysuria. No vaginal symptoms of discharge, itching, burning, irritation. Declines pelvic exam. .    . Vag. Bleeding: None.  Movement: Absent. Denies leaking of fluid.  Ultrasound today for NT screen with no concerning findings. See report. ----------------------------------------------------------------------------------- The following portions of the patient's history were reviewed and updated as appropriate: allergies, current medications, past family history, past medical history, past social history, past surgical history and problem list. Problem list updated.   Objective  Blood pressure 120/70, weight 195 lb (88.5 kg), last menstrual period 10/28/2017. Pregravid weight 196 lb (88.9 kg) Total Weight Gain  (-0.454 kg) Urinalysis:      Fetal Status: Fetal Heart Rate (bpm): Present   Movement: Absent     General:  Alert, oriented and cooperative. Patient is in no acute distress.  Skin: Skin is warm and dry. No rash noted.   Cardiovascular: Normal heart rate noted  Respiratory: Normal respiratory effort, no problems with respiration noted  Abdomen: Soft, gravid, appropriate for gestational age. Pain/Pressure: Present     Pelvic:  Cervical exam deferred        Extremities: Normal range of motion.     Mental Status: Normal mood and affect. Normal behavior. Normal judgment and thought  content.   Assessment   27 y.o. G1P0 at 2375w4d by  08/04/2018, by Last Menstrual Period presenting for routine prenatal visit  Plan   pregnancy Problems (from 01/02/18 to present)    Problem Noted Resolved   Supervision of high risk pregnancy, antepartum 01/02/2018 by Conard NovakJackson, Stephen D, MD No   Insulin resistance 01/02/2018 by Conard NovakJackson, Stephen D, MD No   Depression affecting pregnancy 01/02/2018 by Conard NovakJackson, Stephen D, MD No   Overview Signed 01/02/2018  2:41 PM by Conard NovakJackson, Stephen D, MD    - initial EPDS 21, patient wants to monitor during first trimester [ ]  consider starting zoloft second trimester.          Please refer to After Visit Summary for other counseling recommendations.  -NT screen today - urine culture sent. Patient defers treatment for today.   Return in about 4 weeks (around 02/13/2018) for Routine Prenatal Appointment.  Thomasene MohairStephen Jackson, MD  01/17/2018 11:43 AM

## 2018-01-17 DIAGNOSIS — O219 Vomiting of pregnancy, unspecified: Secondary | ICD-10-CM | POA: Insufficient documentation

## 2018-01-17 HISTORY — DX: Vomiting of pregnancy, unspecified: O21.9

## 2018-01-17 LAB — RPR+RH+ABO+RUB AB+AB SCR+CB...
Antibody Screen: NEGATIVE
HEMOGLOBIN: 13.2 g/dL (ref 11.1–15.9)
HEP B S AG: NEGATIVE
HIV SCREEN 4TH GENERATION: NONREACTIVE
Hematocrit: 38.8 % (ref 34.0–46.6)
MCH: 30.1 pg (ref 26.6–33.0)
MCHC: 34 g/dL (ref 31.5–35.7)
MCV: 88 fL (ref 79–97)
PLATELETS: 270 10*3/uL (ref 150–379)
RBC: 4.39 x10E6/uL (ref 3.77–5.28)
RDW: 13.9 % (ref 12.3–15.4)
RPR Ser Ql: NONREACTIVE
Rh Factor: POSITIVE
VARICELLA: 2746 {index} (ref >165)
WBC: 9.5 10*3/uL (ref 3.4–10.8)

## 2018-01-17 LAB — GLUCOSE, 1 HOUR GESTATIONAL: GESTATIONAL DIABETES SCREEN: 123 mg/dL (ref 65–139)

## 2018-01-17 MED ORDER — ONDANSETRON 8 MG PO TBDP: 8.0000 mg | ORAL_TABLET | Freq: Three times a day (TID) | ORAL | 1 refills | Status: DC | PRN

## 2018-01-17 NOTE — Addendum Note (Signed)
Addended by: Thomasene MohairJACKSON, Tadhg Eskew D on: 01/17/2018 03:06 PM   Modules accepted: Orders

## 2018-01-18 LAB — FIRST TRIMESTER SCREEN W/NT
CRL: 52.3 mm
DIA MoM: 1.11
DIA VALUE: 240 pg/mL
Gest Age-Collect: 11.9 weeks
Maternal Age At EDD: 26.3 yr
NUCHAL TRANSLUCENCY MOM: 0.72
NUMBER OF FETUSES: 1
Nuchal Translucency: 1 mm
PAPP-A MOM: 1.24
PAPP-A Value: 708.7 ng/mL
TEST RESULTS: NEGATIVE
Weight: 195 [lb_av]
hCG MoM: 1.86
hCG Value: 151.1 IU/mL

## 2018-01-19 LAB — URINE CULTURE: Organism ID, Bacteria: NO GROWTH

## 2018-02-13 ENCOUNTER — Ambulatory Visit (INDEPENDENT_AMBULATORY_CARE_PROVIDER_SITE_OTHER): Payer: BLUE CROSS/BLUE SHIELD | Admitting: Obstetrics & Gynecology

## 2018-02-13 VITALS — BP 112/80 | Wt 194.0 lb

## 2018-02-13 DIAGNOSIS — F32A Depression, unspecified: Secondary | ICD-10-CM

## 2018-02-13 DIAGNOSIS — O99340 Other mental disorders complicating pregnancy, unspecified trimester: Secondary | ICD-10-CM

## 2018-02-13 DIAGNOSIS — F329 Major depressive disorder, single episode, unspecified: Secondary | ICD-10-CM

## 2018-02-13 DIAGNOSIS — O219 Vomiting of pregnancy, unspecified: Secondary | ICD-10-CM

## 2018-02-13 DIAGNOSIS — Z3A15 15 weeks gestation of pregnancy: Secondary | ICD-10-CM

## 2018-02-13 DIAGNOSIS — O0990 Supervision of high risk pregnancy, unspecified, unspecified trimester: Secondary | ICD-10-CM

## 2018-02-13 MED ORDER — ONDANSETRON 8 MG PO TBDP: 8.0000 mg | ORAL_TABLET | Freq: Three times a day (TID) | ORAL | 1 refills | Status: DC | PRN

## 2018-02-13 MED ORDER — SERTRALINE HCL 50 MG PO TABS
50.0000 mg | ORAL_TABLET | Freq: Every day | ORAL | 10 refills | Status: DC
Start: 2018-02-13 — End: 2018-08-13

## 2018-02-13 NOTE — Progress Notes (Signed)
  Subjective  Fetal Movement? no Contractions? no Leaking Fluid? no Vaginal Bleeding? no Nausea and GERD Depression still a concern Objective  BP 112/80   Wt 194 lb (88 kg)   LMP 10/28/2017   BMI 29.50 kg/m  General: NAD Pumonary: no increased work of breathing Abdomen: gravid, non-tender Extremities: no edema Psychiatric: mood appropriate, affect full  Assessment  27 y.o. G1P0 at 6213w3d by  08/04/2018, by Last Menstrual Period presenting for routine prenatal visit  Plan   Problem List Items Addressed This Visit      Other   Supervision of high risk pregnancy, antepartum   Relevant Orders   US OB Comp + 14 Wk   Depression affecting pregnancy    Other Visit Diagnoses    [redacted] weeks gestation of pregnancy    -  Primary   Relevant Orders   US OB Comp + 14 Wk     Clinic [facility] Prenatal Labs  Dating Early US Blood type: O/Positive/-- (01/29 1038)   Genetic Screen 1 Screen: Neg Antibody:Negative (01/29 1038)  Anatomic US At [facility] planned Rubella: <0.90 (01/29 1038) Varicella: Imm  GTT Early: 123 Third trimester:  RPR: Non Reactive (01/29 1038)   Rhogam na HBsAg: Negative (01/29 1038)   TDaP vaccine               Flu Shot: 09/2017 HIV: Non Reactive (01/29 1038)   Baby Food                                GBS:   Contraception  Pap:12/2017 neg  Zantac for GERD, cont Zofran for nausea Zoloft for depression, counseled as to pros and cons US nv   Annamarie MajorPaul Lossie Kalp, MD, Merlinda FrederickFACOG [facility], [facility] 02/13/2018  10:02 AM

## 2018-02-13 NOTE — Patient Instructions (Addendum)
Zantac twice daily for heartburn Zofran for nausea Zoloft for mood  Second Trimester of Pregnancy The second trimester is from week 14 through week 27 (months 4 through 6). The second trimester is often a time when you feel your best. Your body has adjusted to being pregnant, and you begin to feel better physically. Usually, morning sickness has lessened or quit completely, you may have more energy, and you may have an increase in appetite. The second trimester is also a time when the fetus is growing rapidly. At the end of the sixth month, the fetus is about 9 inches long and weighs about 1 pounds. You will likely begin to feel the baby move (quickening) between 16 and 20 weeks of pregnancy. Body changes during your second trimester Your body continues to go through many changes during your second trimester. The changes vary from woman to woman.  Your weight will continue to increase. You will notice your lower abdomen bulging out.  You may begin to get stretch marks on your hips, abdomen, and breasts.  You may develop headaches that can be relieved by medicines. The medicines should be approved by your health care provider.  You may urinate more often because the fetus is pressing on your bladder.  You may develop or continue to have heartburn as a result of your pregnancy.  You may develop constipation because certain hormones are causing the muscles that push waste through your intestines to slow down.  You may develop hemorrhoids or swollen, bulging veins (varicose veins).  You may have back pain. This is caused by: ? Weight gain. ? Pregnancy hormones that are relaxing the joints in your pelvis. ? A shift in weight and the muscles that support your balance.  Your breasts will continue to grow and they will continue to become tender.  Your gums may bleed and may be sensitive to brushing and flossing.  Dark spots or blotches (chloasma, mask of pregnancy) may develop on your face.  This will likely fade after the baby is born.  A dark line from your belly button to the pubic area (linea nigra) may appear. This will likely fade after the baby is born.  You may have changes in your hair. These can include thickening of your hair, rapid growth, and changes in texture. Some women also have hair loss during or after pregnancy, or hair that feels dry or thin. Your hair will most likely return to normal after your baby is born.  What to expect at prenatal visits During a routine prenatal visit:  You will be weighed to make sure you and the fetus are growing normally.  Your blood pressure will be taken.  Your abdomen will be measured to track your baby's growth.  The fetal heartbeat will be listened to.  Any test results from the previous visit will be discussed.  Your health care provider may ask you:  How you are feeling.  If you are feeling the baby move.  If you have had any abnormal symptoms, such as leaking fluid, bleeding, severe headaches, or abdominal cramping.  If you are using any tobacco products, including cigarettes, chewing tobacco, and electronic cigarettes.  If you have any questions.  Other tests that may be performed during your second trimester include:  Blood tests that check for: ? Low iron levels (anemia). ? High blood sugar that affects pregnant women (gestational diabetes) between 2724 and 28 weeks. ? Rh antibodies. This is to check for a protein on red blood  cells (Rh factor).  Urine tests to check for infections, diabetes, or protein in the urine.  An ultrasound to confirm the proper growth and development of the baby.  An amniocentesis to check for possible genetic problems.  Fetal screens for spina bifida and Down syndrome.  HIV (human immunodeficiency virus) testing. Routine prenatal testing includes screening for HIV, unless you choose not to have this test.  Follow these instructions at home: Medicines  Follow your health  care provider's instructions regarding medicine use. Specific medicines may be either safe or unsafe to take during pregnancy.  Take a prenatal vitamin that contains at least 600 micrograms (mcg) of folic acid.  If you develop constipation, try taking a stool softener if your health care provider approves. Eating and drinking  Eat a balanced diet that includes fresh fruits and vegetables, whole grains, good sources of protein such as meat, eggs, or tofu, and low-fat dairy. Your health care provider will help you determine the amount of weight gain that is right for you.  Avoid raw meat and uncooked cheese. These carry germs that can cause birth defects in the baby.  If you have low calcium intake from food, talk to your health care provider about whether you should take a daily calcium supplement.  Limit foods that are high in fat and processed sugars, such as fried and sweet foods.  To prevent constipation: ? Drink enough fluid to keep your urine clear or pale yellow. ? Eat foods that are high in fiber, such as fresh fruits and vegetables, whole grains, and beans. Activity  Exercise only as directed by your health care provider. Most women can continue their usual exercise routine during pregnancy. Try to exercise for 30 minutes at least 5 days a week. Stop exercising if you experience uterine contractions.  Avoid heavy lifting, wear low heel shoes, and practice good posture.  A sexual relationship may be continued unless your health care provider directs you otherwise. Relieving pain and discomfort  Wear a good support bra to prevent discomfort from breast tenderness.  Take warm sitz baths to soothe any pain or discomfort caused by hemorrhoids. Use hemorrhoid cream if your health care provider approves.  Rest with your legs elevated if you have leg cramps or low back pain.  If you develop varicose veins, wear support hose. Elevate your feet for 15 minutes, 3-4 times a day. Limit  salt in your diet. Prenatal Care  Write down your questions. Take them to your prenatal visits.  Keep all your prenatal visits as told by your health care provider. This is important. Safety  Wear your seat belt at all times when driving.  Make a list of emergency phone numbers, including numbers for family, friends, the hospital, and police and fire departments. General instructions  Ask your health care provider for a referral to a local prenatal education class. Begin classes no later than the beginning of month 6 of your pregnancy.  Ask for help if you have counseling or nutritional needs during pregnancy. Your health care provider can offer advice or refer you to specialists for help with various needs.  Do not use hot tubs, steam rooms, or saunas.  Do not douche or use tampons or scented sanitary pads.  Do not cross your legs for long periods of time.  Avoid cat litter boxes and soil used by cats. These carry germs that can cause birth defects in the baby and possibly loss of the fetus by miscarriage or stillbirth.  Avoid  all smoking, herbs, alcohol, and unprescribed drugs. Chemicals in these products can affect the formation and growth of the baby.  Do not use any products that contain nicotine or tobacco, such as cigarettes and e-cigarettes. If you need help quitting, ask your health care provider.  Visit your dentist if you have not gone yet during your pregnancy. Use a soft toothbrush to brush your teeth and be gentle when you floss. Contact a health care provider if:  You have dizziness.  You have mild pelvic cramps, pelvic pressure, or nagging pain in the abdominal area.  You have persistent nausea, vomiting, or diarrhea.  You have a bad smelling vaginal discharge.  You have pain when you urinate. Get help right away if:  You have a fever.  You are leaking fluid from your vagina.  You have spotting or bleeding from your vagina.  You have severe abdominal  cramping or pain.  You have rapid weight gain or weight loss.  You have shortness of breath with chest pain.  You notice sudden or extreme swelling of your face, hands, ankles, feet, or legs.  You have not felt your baby move in over an hour.  You have severe headaches that do not go away when you take medicine.  You have vision changes. Summary  The second trimester is from week 14 through week 27 (months 4 through 6). It is also a time when the fetus is growing rapidly.  Your body goes through many changes during pregnancy. The changes vary from woman to woman.  Avoid all smoking, herbs, alcohol, and unprescribed drugs. These chemicals affect the formation and growth your baby.  Do not use any tobacco products, such as cigarettes, chewing tobacco, and e-cigarettes. If you need help quitting, ask your health care provider.  Contact your health care provider if you have any questions. Keep all prenatal visits as told by your health care provider. This is important. This information is not intended to replace advice given to you by your health care provider. Make sure you discuss any questions you have with your health care provider. Document Released: 11/29/2001 Document Revised: 01/10/2017 Document Reviewed: 01/10/2017 Elsevier Interactive Patient Education  [employer].

## 2018-03-13 ENCOUNTER — Ambulatory Visit (INDEPENDENT_AMBULATORY_CARE_PROVIDER_SITE_OTHER): Payer: BLUE CROSS/BLUE SHIELD

## 2018-03-13 ENCOUNTER — Ambulatory Visit (INDEPENDENT_AMBULATORY_CARE_PROVIDER_SITE_OTHER): Payer: BLUE CROSS/BLUE SHIELD | Admitting: Obstetrics & Gynecology

## 2018-03-13 VITALS — BP 120/70 | Wt 191.0 lb

## 2018-03-13 DIAGNOSIS — Z3A15 15 weeks gestation of pregnancy: Secondary | ICD-10-CM

## 2018-03-13 DIAGNOSIS — F329 Major depressive disorder, single episode, unspecified: Secondary | ICD-10-CM

## 2018-03-13 DIAGNOSIS — O99340 Other mental disorders complicating pregnancy, unspecified trimester: Secondary | ICD-10-CM

## 2018-03-13 DIAGNOSIS — O4442 Low lying placenta NOS or without hemorrhage, second trimester: Secondary | ICD-10-CM

## 2018-03-13 DIAGNOSIS — O0992 Supervision of high risk pregnancy, unspecified, second trimester: Secondary | ICD-10-CM

## 2018-03-13 DIAGNOSIS — Z3A19 19 weeks gestation of pregnancy: Secondary | ICD-10-CM

## 2018-03-13 DIAGNOSIS — O0990 Supervision of high risk pregnancy, unspecified, unspecified trimester: Secondary | ICD-10-CM | POA: Diagnosis not present

## 2018-03-13 DIAGNOSIS — Z362 Encounter for other antenatal screening follow-up: Secondary | ICD-10-CM

## 2018-03-13 NOTE — Progress Notes (Signed)
  Subjective  Fetal Movement? yes Contractions? no Leaking Fluid? no Vaginal Bleeding? no  Objective  BP 120/70   Wt 191 lb (86.6 kg)   LMP 10/28/2017   BMI 29.04 kg/m  General: NAD Pumonary: no increased work of breathing Abdomen: gravid, non-tender Extremities: no edema Psychiatric: mood appropriate, affect full  Assessment  27 y.o. G1P0 at 3878w3d by  08/04/2018, by Last Menstrual Period presenting for routine prenatal visit  Plan   Problem List Items Addressed This Visit      Other   Supervision of high risk pregnancy, antepartum   Depression affecting pregnancy    Other Visit Diagnoses    [redacted] weeks gestation of pregnancy    -  Primary   Encounter for other antenatal screening follow-up       Relevant Orders   US OB Follow Up   Low-lying placenta in second trimester       Relevant Orders   US OB Follow Up    Review of ULTRASOUND. I have personally reviewed images and report of [facility]. There is a singleton gestation with subjectively normal amniotic fluid volume. The fetal biometry correlates with established dating. Detailed evaluation of the fetal anatomy was performed.The fetal anatomical survey appears within normal limits within the resolution of ultrasound as described above.  It must be noted that a normal ultrasound is unable to rule out fetal aneuploidy.    Cont Zoloft, feels much improved  Annamarie MajorPaul Lotoya Casella, MD, Merlinda FrederickFACOG [facility], [facility] 03/13/2018  4:43 PM

## 2018-03-15 ENCOUNTER — Encounter: Payer: Self-pay | Admitting: Obstetrics & Gynecology

## 2018-03-15 ENCOUNTER — Other Ambulatory Visit: Payer: Self-pay | Admitting: Obstetrics & Gynecology

## 2018-03-15 DIAGNOSIS — O219 Vomiting of pregnancy, unspecified: Secondary | ICD-10-CM

## 2018-03-15 DIAGNOSIS — O0990 Supervision of high risk pregnancy, unspecified, unspecified trimester: Secondary | ICD-10-CM

## 2018-03-15 MED ORDER — ONDANSETRON 8 MG PO TBDP: 8.0000 mg | ORAL_TABLET | Freq: Three times a day (TID) | ORAL | 1 refills | Status: DC | PRN

## 2018-04-10 ENCOUNTER — Encounter: Payer: Self-pay | Admitting: Advanced Practice Midwife

## 2018-04-11 ENCOUNTER — Encounter: Payer: BLUE CROSS/BLUE SHIELD | Admitting: Obstetrics and Gynecology

## 2018-04-11 ENCOUNTER — Other Ambulatory Visit: Payer: BLUE CROSS/BLUE SHIELD

## 2018-04-12 ENCOUNTER — Encounter: Payer: Self-pay | Admitting: Advanced Practice Midwife

## 2018-04-12 ENCOUNTER — Ambulatory Visit (INDEPENDENT_AMBULATORY_CARE_PROVIDER_SITE_OTHER): Payer: BLUE CROSS/BLUE SHIELD | Admitting: Advanced Practice Midwife

## 2018-04-12 ENCOUNTER — Ambulatory Visit (INDEPENDENT_AMBULATORY_CARE_PROVIDER_SITE_OTHER): Payer: BLUE CROSS/BLUE SHIELD

## 2018-04-12 VITALS — BP 122/74 | Wt 194.0 lb

## 2018-04-12 DIAGNOSIS — Z113 Encounter for screening for infections with a predominantly sexual mode of transmission: Secondary | ICD-10-CM

## 2018-04-12 DIAGNOSIS — Z362 Encounter for other antenatal screening follow-up: Secondary | ICD-10-CM | POA: Diagnosis not present

## 2018-04-12 DIAGNOSIS — O4442 Low lying placenta NOS or without hemorrhage, second trimester: Secondary | ICD-10-CM | POA: Diagnosis not present

## 2018-04-12 DIAGNOSIS — Z3A23 23 weeks gestation of pregnancy: Secondary | ICD-10-CM

## 2018-04-12 DIAGNOSIS — Z131 Encounter for screening for diabetes mellitus: Secondary | ICD-10-CM

## 2018-04-12 DIAGNOSIS — O0990 Supervision of high risk pregnancy, unspecified, unspecified trimester: Secondary | ICD-10-CM

## 2018-04-12 DIAGNOSIS — O219 Vomiting of pregnancy, unspecified: Secondary | ICD-10-CM

## 2018-04-12 DIAGNOSIS — Z130 Encounter for screening for diseases of the blood and blood-forming organs and certain disorders involving the immune mechanism: Secondary | ICD-10-CM

## 2018-04-12 MED ORDER — ONDANSETRON 8 MG PO TBDP: 8.0000 mg | ORAL_TABLET | Freq: Three times a day (TID) | ORAL | 1 refills | Status: DC | PRN

## 2018-04-12 NOTE — Progress Notes (Signed)
Repeat anatomy scan today. No vb. No lof.

## 2018-04-12 NOTE — Patient Instructions (Signed)
Pregnancy and Travel Most pregnant woman can safely travel until the last month of the pregnancy. However, pregnant women with medical problems or problems with their pregnancy should limit or avoid travel. The best time to travel is between 14 and 28 weeks of the pregnancy. During this period, morning sickness should be minimal and other problems are less likely to develop. General travel tips Before you go:  Discuss your trip with your health care provider and get examined shortly before you go.  Get a copy of your medical records and be sure to take them with you.  Try to get names of doctors and hospitals in the area you will be visiting.  Pack your pillow if you can.  Get a good night's sleep the night before you make your trip.  During your trip:  Ask for locations of doctors and hospitals if you did not do this before leaving.  Wear flat, comfortable shoes.  Eat a balanced diet, drink a lot of fluids, and take your vitamins and supplements.  Do not wear yourself out.  Do not ride on a motorcycle.  Rest. If you spent a lot of time traveling, lie down for 30 or more minutes with your feet slightly raised after you reach your destination.  Tips for traveling to a foreign country Before you go:  Ask your health care provider if there are any medicines that are safe to take if you get diarrhea, constipation, nausea, or vomiting.  Make copies of your medical records in case you lose the originals.  During your trip:  Do not eat uncooked foods of any kind.  Drink bottled water and do not use ice.  Wash fruits and vegetables with hot, soapy water.  Only drink pasteurized milk.  Tips for traveling by car  Wear your seat belt properly.  If you are in the front seat, sit as far away from the dashboard as possible to avoid getting hit hard if the air bag deploys in an accident.  Stop about every 2 hours to use the restroom and walk around. This helps the circulation in  your legs.  Keep water, crackers, and fruit in the car.  Do not travel for more than 6 hours a day. Tips for traveling by bus  Before making a reservation, ask whether your bus will have a restroom.  Take water, crackers, and fruit with you.  Get out and walk around if and when the bus stops.  Move your arms and legs when seated. This helps with your circulation. Tips for traveling by train Before making a reservation, ask if your train will have a sleeping car and more than one restroom. Tips for traveling by airplane  Before booking your trip, ask about the airline's rules about pregnancy. Pregnant women may be restricted from flying after a certain time of the pregnancy. Every airline has its own rules and regulations.  Ask whether the airplane cabin will be pressurized. Do not board an unpressurized plane that will fly above 7,000 ft (2,100 km).  Try to get a bulkhead or an aisle seat.  Wear layered clothing because the temperature in the cabin can change.  Take water, crackers, and fruit with you on the airplane.  Put all your medicines and medical records in your carry-on bag.  Avoid drinks with caffeine and do not eat a big meal.  Do not walk around the airplane to stretch your legs.  Move your arms and legs while sitting to help with your   circulation.  Wear your seat belt at all times. Tips for traveling by cruise ship  Before booking your trip, ask the following questions: ? Are pregnant women allowed on the cruise ship? ? Is there a medical facility and health care provider on board? ? Does the ship dock in cities where there are health care providers and medical facilities?  Before booking your trip, ask your health care provider if: ? It is safe for you to take medicines if you get seasick. ? It is safe for you to wear acupressure wristbands to prevent getting seasick. If your health care provider says it is safe, consider purchasing one. This information is  not intended to replace advice given to you by your health care provider. Make sure you discuss any questions you have with your health care provider. Document Released: 11/17/2008 Document Revised: 05/12/2016 Document Reviewed: 11/01/2013 Elsevier Interactive Patient Education  2017 Elsevier Inc.  

## 2018-04-12 NOTE — Progress Notes (Signed)
Routine Prenatal Care Visit  Subjective  Brianna Marshall is a 27 y.o. G1P0 at [redacted]w[redacted]d being seen today for ongoing prenatal care.  She is currently monitored for the following issues for this high-risk pregnancy and has Headache, migraine; PCOS (polycystic ovarian syndrome); Prediabetes; Vitamin D deficiency; Infertility, anovulation; Supervision of high risk pregnancy, antepartum; Insulin resistance; Depression affecting pregnancy; and Nausea/vomiting in pregnancy on their problem list.  ----------------------------------------------------------------------------------- Patient reports improvement in heartburn with nexium. She needs refill of zofran due to continued nausea/vomiting without med. She is traveling international in 4 weeks and will be gone for 2 weeks. Discussed travel precautions. Going to [place]- low risk for zika.    . Vag. Bleeding: None.  Movement: Present. Denies leaking of fluid.  ----------------------------------------------------------------------------------- The following portions of the patient's history were reviewed and updated as appropriate: allergies, current medications, past family history, past medical history, past social history, past surgical history and problem list. Problem list updated.   Objective  Blood pressure 122/74, weight 194 lb (88 kg), last menstrual period 10/28/2017. Pregravid weight 196 lb (88.9 kg) Total Weight Gain -2 lb (-0.907 kg) Urinalysis: Urine Protein: Negative Urine Glucose: Negative  Fetal Status: Fetal Heart Rate (bpm): 153   Movement: Present     Follow up anatomy scan is now complete. Baby is breech today.  General:  Alert, oriented and cooperative. Patient is in no acute distress.  Skin: Skin is warm and dry. No rash noted.   Cardiovascular: Normal heart rate noted  Respiratory: Normal respiratory effort, no problems with respiration noted  Abdomen: Soft, gravid, appropriate for gestational age. Pain/Pressure: Absent       Pelvic:  Cervical exam deferred        Extremities: Normal range of motion.     Mental Status: Normal mood and affect. Normal behavior. Normal judgment and thought content.   Assessment   27 y.o. G1P0 at [redacted]w[redacted]d by  08/04/2018, by Last Menstrual Period presenting for routine prenatal visit  Plan   pregnancy Problems (from 01/02/18 to present)    Problem Noted Resolved   Nausea/vomiting in pregnancy 01/17/2018 by Conard Novak, MD No   Supervision of high risk pregnancy, antepartum 01/02/2018 by Conard Novak, MD No   Overview Addendum 02/13/2018 10:05 AM by Nadara Mustard, MD    Clinic [facility] Prenatal Labs  Dating Early [place] Blood type: O/Positive/-- (01/29 1038)   Genetic Screen 1 Screen: Neg Antibody:Negative (01/29 1038)  Anatomic [place]  Rubella: <0.90 (01/29 1038) Varicella: Imm  GTT Early: 123 Third trimester:  RPR: Non Reactive (01/29 1038)   Rhogam n/a HBsAg: Negative (01/29 1038)   TDaP vaccine            Flu Shot: 09/2017 HIV: Non Reactive (01/29 1038)   Baby Food                                GBS:   Contraception  Pap:12/2017 normal  CBB     CS/VBAC n/a   Support Person            Insulin resistance 01/02/2018 by Conard Novak, MD No   Depression affecting pregnancy 01/02/2018 by Conard Novak, MD No   Overview Signed 01/02/2018  2:41 PM by Conard Novak, MD    - initial EPDS 21, patient wants to monitor during first trimester [ ]  consider starting zoloft second trimester.  Preterm labor symptoms and general obstetric precautions including but not limited to vaginal bleeding, contractions, leaking of fluid and fetal movement were reviewed in detail with the patient. Please refer to After Visit Summary for other counseling recommendations.   Return in about 1 month (around 05/10/2018) for 28 wk labs and rob.  Recheck placentation at 30 wk visit.  Tresea MallJane Thurston Brendlinger, CNM 04/12/2018 11:45 AM

## 2018-04-30 ENCOUNTER — Encounter: Payer: Self-pay | Admitting: Advanced Practice Midwife

## 2018-04-30 ENCOUNTER — Other Ambulatory Visit: Payer: Self-pay | Admitting: Obstetrics and Gynecology

## 2018-04-30 DIAGNOSIS — O0990 Supervision of high risk pregnancy, unspecified, unspecified trimester: Secondary | ICD-10-CM

## 2018-04-30 DIAGNOSIS — O219 Vomiting of pregnancy, unspecified: Secondary | ICD-10-CM

## 2018-04-30 MED ORDER — ONDANSETRON 8 MG PO TBDP: 8.0000 mg | ORAL_TABLET | Freq: Three times a day (TID) | ORAL | 1 refills | Status: DC | PRN

## 2018-04-30 NOTE — Telephone Encounter (Signed)
Please let patient know refill sent. Thank you, Dr. Jerene Pitch

## 2018-05-10 ENCOUNTER — Encounter: Payer: BLUE CROSS/BLUE SHIELD | Admitting: Obstetrics and Gynecology

## 2018-05-10 ENCOUNTER — Ambulatory Visit (INDEPENDENT_AMBULATORY_CARE_PROVIDER_SITE_OTHER): Payer: BLUE CROSS/BLUE SHIELD | Admitting: Obstetrics and Gynecology

## 2018-05-10 ENCOUNTER — Encounter: Payer: Self-pay | Admitting: Obstetrics and Gynecology

## 2018-05-10 ENCOUNTER — Other Ambulatory Visit: Payer: BLUE CROSS/BLUE SHIELD

## 2018-05-10 VITALS — BP 102/62 | Wt 193.0 lb

## 2018-05-10 DIAGNOSIS — Z131 Encounter for screening for diabetes mellitus: Secondary | ICD-10-CM

## 2018-05-10 DIAGNOSIS — Z130 Encounter for screening for diseases of the blood and blood-forming organs and certain disorders involving the immune mechanism: Secondary | ICD-10-CM

## 2018-05-10 DIAGNOSIS — O0990 Supervision of high risk pregnancy, unspecified, unspecified trimester: Secondary | ICD-10-CM

## 2018-05-10 DIAGNOSIS — Z113 Encounter for screening for infections with a predominantly sexual mode of transmission: Secondary | ICD-10-CM

## 2018-05-10 DIAGNOSIS — F32A Depression, unspecified: Secondary | ICD-10-CM

## 2018-05-10 DIAGNOSIS — O4440 Low lying placenta NOS or without hemorrhage, unspecified trimester: Secondary | ICD-10-CM | POA: Insufficient documentation

## 2018-05-10 DIAGNOSIS — R739 Hyperglycemia, unspecified: Secondary | ICD-10-CM

## 2018-05-10 DIAGNOSIS — F329 Major depressive disorder, single episode, unspecified: Secondary | ICD-10-CM

## 2018-05-10 DIAGNOSIS — Z3A27 27 weeks gestation of pregnancy: Secondary | ICD-10-CM

## 2018-05-10 DIAGNOSIS — O219 Vomiting of pregnancy, unspecified: Secondary | ICD-10-CM

## 2018-05-10 DIAGNOSIS — O99340 Other mental disorders complicating pregnancy, unspecified trimester: Secondary | ICD-10-CM

## 2018-05-10 MED ORDER — ONDANSETRON 8 MG PO TBDP: 8.0000 mg | ORAL_TABLET | Freq: Three times a day (TID) | ORAL | 1 refills | Status: DC | PRN

## 2018-05-10 NOTE — Progress Notes (Signed)
Routine Prenatal Care Visit  Subjective  Brianna Marshall is a 27 y.o. G1P0 at [redacted]w[redacted]d being seen today for ongoing prenatal care.  She is currently monitored for the following issues for this low-risk pregnancy and has Headache, migraine; PCOS (polycystic ovarian syndrome); Prediabetes; Vitamin D deficiency; Infertility, anovulation; Supervision of high risk pregnancy, antepartum; Insulin resistance; Depression affecting pregnancy; Nausea/vomiting in pregnancy; and Low-lying placenta on their problem list.  ----------------------------------------------------------------------------------- Patient reports no complaints.    . Vag. Bleeding: None.  Movement: Present. Denies leaking of fluid.  ----------------------------------------------------------------------------------- The following portions of the patient's history were reviewed and updated as appropriate: allergies, current medications, past family history, past medical history, past social history, past surgical history and problem list. Problem list updated.   Objective  Blood pressure 102/62, weight 193 lb (87.5 kg), last menstrual period 10/28/2017. Pregravid weight 196 lb (88.9 kg) Total Weight Gain -3 lb (-1.361 kg) Urinalysis: Urine Protein: Negative Urine Glucose: Negative  Fetal Status: Fetal Heart Rate (bpm): 135 Fundal Height: 28 cm Movement: Present     General:  Alert, oriented and cooperative. Patient is in no acute distress.  Skin: Skin is warm and dry. No rash noted.   Cardiovascular: Normal heart rate noted  Respiratory: Normal respiratory effort, no problems with respiration noted  Abdomen: Soft, gravid, appropriate for gestational age. Pain/Pressure: Absent     Pelvic:  Cervical exam deferred        Extremities: Normal range of motion.     ental Status: Normal mood and affect. Normal behavior. Normal judgment and thought content.     Assessment   27 y.o. G1P0 at [redacted]w[redacted]d by  08/04/2018, by Last Menstrual  Period presenting for routine prenatal visit  Plan   pregnancy Problems (from 01/02/18 to present)    Problem Noted Resolved   Low-lying placenta 05/10/2018 by Natale Milch, MD No   Overview Signed 05/10/2018 10:18 AM by Natale Milch, MD     Follow up at [redacted] weeks gestation      Nausea/vomiting in pregnancy 01/17/2018 by Conard Novak, MD No   Supervision of high risk pregnancy, antepartum 01/02/2018 by Conard Novak, MD No   Overview Addendum 02/13/2018 10:05 AM by Nadara Mustard, MD    [facility]inic [facility] Prenatal Labs  Dating Early [place] Blood type: O/Positive/-- (01/29 1038)   Genetic Screen 1 Screen: Neg Antibody:Negative (01/29 1038)  Anatomic [place]  Rubella: <0.90 (01/29 1038) Varicella: Imm  GTT Early: 123 Third trimester:  RPR: Non Reactive (01/29 1038)   Rhogam n/a HBsAg: Negative (01/29 1038)   TDaP vaccine            Flu Shot: 09/2017 HIV: Non Reactive (01/29 1038)   Baby Food                                GBS:   Contraception  Pap:12/2017 normal  CBB     CS/VBAC n/a   Support Person            Insulin resistance 01/02/2018 by Conard Novak, MD No   Depression affecting pregnancy 01/02/2018 by Conard Novak, MD No   Overview Signed 01/02/2018  2:41 PM by Conard Novak, MD    - initial EPDS 21, patient wants to monitor during first trimester  consider starting zoloft second trimester.           Gestational age appropriate obstetric  precautions including but not limited to vaginal bleeding, contractions, leaking of fluid and fetal movement were reviewed in detail with the patient.    Refill for Zofran sent. Discussed B6 and unisom.  Discussed hemorrhoid treatment with witch hazel pads Return in about 2 weeks (around 05/24/2018) for ROB and [place].  Adelene Idler MD [facility], [facility] 05/10/18 10:34 AM

## 2018-05-10 NOTE — Patient Instructions (Signed)

## 2018-05-10 NOTE — Progress Notes (Signed)
Pt reports no problems. 28 week labs today.

## 2018-05-11 LAB — 28 WEEK RH+PANEL
BASOS: 0 %
Basophils Absolute: 0 10*3/uL (ref 0.0–0.2)
EOS (ABSOLUTE): 0.1 10*3/uL (ref 0.0–0.4)
EOS: 1 %
Gestational Diabetes Screen: 162 mg/dL — ABNORMAL HIGH (ref 65–139)
HEMOGLOBIN: 10.9 g/dL — AB (ref 11.1–15.9)
HIV SCREEN 4TH GENERATION: NONREACTIVE
Hematocrit: 33.9 % — ABNORMAL LOW (ref 34.0–46.6)
IMMATURE GRANS (ABS): 0 10*3/uL (ref 0.0–0.1)
Immature Granulocytes: 0 %
LYMPHS ABS: 1.8 10*3/uL (ref 0.7–3.1)
LYMPHS: 16 %
MCH: 29.8 pg (ref 26.6–33.0)
MCHC: 32.2 g/dL (ref 31.5–35.7)
MCV: 93 fL (ref 79–97)
MONOCYTES: 5 %
Monocytes Absolute: 0.5 10*3/uL (ref 0.1–0.9)
NEUTROS ABS: 8.9 10*3/uL — AB (ref 1.4–7.0)
Neutrophils: 78 %
Platelets: 327 10*3/uL (ref 150–450)
RBC: 3.66 x10E6/uL — ABNORMAL LOW (ref 3.77–5.28)
RDW: 13.3 % (ref 12.3–15.4)
RPR Ser Ql: NONREACTIVE
WBC: 11.4 10*3/uL — AB (ref 3.4–10.8)

## 2018-05-15 ENCOUNTER — Encounter (INDEPENDENT_AMBULATORY_CARE_PROVIDER_SITE_OTHER): Payer: Self-pay

## 2018-05-15 NOTE — Progress Notes (Signed)
Left voice mail and sent MyChart message to patient to call and schedule a 3 hour gtt due to elevated 1 hour gtt.

## 2018-05-30 ENCOUNTER — Ambulatory Visit (INDEPENDENT_AMBULATORY_CARE_PROVIDER_SITE_OTHER): Payer: BLUE CROSS/BLUE SHIELD

## 2018-05-30 ENCOUNTER — Encounter: Payer: Self-pay | Admitting: Obstetrics and Gynecology

## 2018-05-30 ENCOUNTER — Ambulatory Visit (INDEPENDENT_AMBULATORY_CARE_PROVIDER_SITE_OTHER): Payer: BLUE CROSS/BLUE SHIELD | Admitting: Obstetrics and Gynecology

## 2018-05-30 ENCOUNTER — Other Ambulatory Visit: Payer: BLUE CROSS/BLUE SHIELD

## 2018-05-30 VITALS — BP 120/80 | Wt 198.0 lb

## 2018-05-30 DIAGNOSIS — Z23 Encounter for immunization: Secondary | ICD-10-CM

## 2018-05-30 DIAGNOSIS — O99013 Anemia complicating pregnancy, third trimester: Secondary | ICD-10-CM

## 2018-05-30 DIAGNOSIS — Z3A30 30 weeks gestation of pregnancy: Secondary | ICD-10-CM | POA: Diagnosis not present

## 2018-05-30 DIAGNOSIS — O4440 Low lying placenta NOS or without hemorrhage, unspecified trimester: Secondary | ICD-10-CM

## 2018-05-30 DIAGNOSIS — O0993 Supervision of high risk pregnancy, unspecified, third trimester: Secondary | ICD-10-CM | POA: Diagnosis not present

## 2018-05-30 DIAGNOSIS — O99019 Anemia complicating pregnancy, unspecified trimester: Secondary | ICD-10-CM

## 2018-05-30 DIAGNOSIS — O4443 Low lying placenta NOS or without hemorrhage, third trimester: Secondary | ICD-10-CM

## 2018-05-30 DIAGNOSIS — Z131 Encounter for screening for diabetes mellitus: Secondary | ICD-10-CM

## 2018-05-30 DIAGNOSIS — O0990 Supervision of high risk pregnancy, unspecified, unspecified trimester: Secondary | ICD-10-CM

## 2018-05-30 DIAGNOSIS — R739 Hyperglycemia, unspecified: Secondary | ICD-10-CM

## 2018-05-30 HISTORY — DX: Anemia complicating pregnancy, unspecified trimester: O99.019

## 2018-05-30 MED ORDER — TETANUS-DIPHTH-ACELL PERTUSSIS 5-2.5-18.5 LF-MCG/0.5 IM SUSP
0.5000 mL | Freq: Once | INTRAMUSCULAR | Status: AC
  Administered 2018-05-30: 0.5 mL via INTRAMUSCULAR

## 2018-05-30 MED ORDER — FERROUS SULFATE 325 (65 FE) MG PO TABS: 325.0000 mg | ORAL_TABLET | Freq: Two times a day (BID) | ORAL | 1 refills | Status: DC

## 2018-05-30 NOTE — Progress Notes (Signed)
Routine Prenatal Care Visit  Subjective  Brianna Marshall is a 27 y.o. G1P0 at [redacted]w[redacted]d being seen today for ongoing prenatal care.  She is currently monitored for the following issues for this high-risk pregnancy and has Headache, migraine; PCOS (polycystic ovarian syndrome); Prediabetes; Vitamin D deficiency; Infertility, anovulation; Supervision of high risk pregnancy, antepartum; Insulin resistance; Depression affecting pregnancy; and Nausea/vomiting in pregnancy on their problem list.  ----------------------------------------------------------------------------------- Patient reports no complaints.   Contractions: Not present. Vag. Bleeding: None.  Movement: Present. Denies leaking of fluid.  ----------------------------------------------------------------------------------- The following portions of the patient's history were reviewed and updated as appropriate: allergies, current medications, past family history, past medical history, past social history, past surgical history and problem list. Problem list updated.   Objective  Blood pressure 120/80, weight 198 lb (89.8 kg), last menstrual period 10/28/2017, unknown if currently breastfeeding. Pregravid weight 196 lb (88.9 kg) Total Weight Gain 2 lb (0.907 kg) Urinalysis: Urine Protein: Negative Urine Glucose: Negative  Fetal Status: Fetal Heart Rate (bpm): 148 Fundal Height: 31 cm Movement: Present  Presentation: Vertex  General:  Alert, oriented and cooperative. Patient is in no acute distress.  Skin: Skin is warm and dry. No rash noted.   Cardiovascular: Normal heart rate noted  Respiratory: Normal respiratory effort, no problems with respiration noted  Abdomen: Soft, gravid, appropriate for gestational age. Pain/Pressure: Absent     Pelvic:  Cervical exam deferred        Extremities: Normal range of motion.     ental Status: Normal mood and affect. Normal behavior. Normal judgment and thought content.     Assessment    27 y.o. G1P0 at [redacted]w[redacted]d by  08/04/2018, by Last Menstrual Period presenting for routine prenatal visit  Plan   pregnancy Problems (from 01/02/18 to 05/30/18)    Problem Noted Resolved   Nausea/vomiting in pregnancy 01/17/2018 by Conard Novak, MD No   Supervision of high risk pregnancy, antepartum 01/02/2018 by Conard Novak, MD No   Overview Addendum 02/13/2018 10:05 AM by Nadara Mustard, MD    Clinic [facility] Prenatal Labs  Dating Early [place] Blood type: O/Positive/-- (01/29 1038)   Genetic Screen 1 Screen: Neg Antibody:Negative (01/29 1038)  Anatomic [place]  Rubella: <0.90 (01/29 1038) Varicella: Imm  GTT Early: 123 Third trimester:  RPR: Non Reactive (01/29 1038)   Rhogam n/a HBsAg: Negative (01/29 1038)   TDaP vaccine            Flu Shot: 09/2017 HIV: Non Reactive (01/29 1038)   Baby Food                                GBS:   Contraception  Pap:12/2017 normal  CBB     CS/VBAC n/a   Support Person            Insulin resistance 01/02/2018 by Conard Novak, MD No   Depression affecting pregnancy 01/02/2018 by Conard Novak, MD No   Overview Signed 01/02/2018  2:41 PM by Conard Novak, MD    - initial EPDS 21, patient wants to monitor during first trimester [ ]  consider starting zoloft second trimester.       Low-lying placenta 05/10/2018 by Natale Milch, MD 05/30/2018 by Natale Milch, MD   Overview Signed 05/10/2018 10:18 AM by Natale Milch, MD     Follow up at [redacted] weeks gestation  Gestational age appropriate obstetric precautions including but not limited to vaginal bleeding, contractions, leaking of fluid and fetal movement were reviewed in detail with the patient.    Three hour is pending. Asked patient to call by tomorrow for results.  Given information on prenatal classes with [facility]. Patient is taking classes. Given information on volunteer doula program at the hospital.  Discussed breast feeding. Patient is planning  on breast feeding. Anemia in pregnancy- added on iron supplementation twice a day.  Return in about 2 weeks (around 06/13/2018) for ROB.  Adelene Idlerhristanna Marquesha Robideau MD [facility], [facility] 05/30/18 4:12 PM

## 2018-05-31 LAB — GESTATIONAL GLUCOSE TOLERANCE
Glucose, Fasting: 82 mg/dL (ref 65–94)
Glucose, GTT - 1 Hour: 157 mg/dL (ref 65–179)
Glucose, GTT - 2 Hour: 167 mg/dL — ABNORMAL HIGH (ref 65–154)
Glucose, GTT - 3 Hour: 146 mg/dL — ABNORMAL HIGH (ref 65–139)

## 2018-06-01 ENCOUNTER — Encounter: Payer: Self-pay | Admitting: Obstetrics and Gynecology

## 2018-06-03 ENCOUNTER — Other Ambulatory Visit: Payer: Self-pay | Admitting: Advanced Practice Midwife

## 2018-06-03 DIAGNOSIS — O24419 Gestational diabetes mellitus in pregnancy, unspecified control: Secondary | ICD-10-CM

## 2018-06-03 NOTE — Progress Notes (Signed)
Referral sent for [facility]. Failed 3 hour gtt.

## 2018-06-04 NOTE — Telephone Encounter (Signed)
Jane sent result to patient yesterday. She has gestational diabetes, asked her to come in this week for a visit so that we can discuss checking blood glucose levels. Thank you, Dr. Jerene PitchSchuman

## 2018-06-05 ENCOUNTER — Ambulatory Visit (INDEPENDENT_AMBULATORY_CARE_PROVIDER_SITE_OTHER): Payer: BLUE CROSS/BLUE SHIELD | Admitting: Obstetrics and Gynecology

## 2018-06-05 ENCOUNTER — Encounter: Payer: Self-pay | Admitting: Obstetrics and Gynecology

## 2018-06-05 ENCOUNTER — Encounter: Payer: BLUE CROSS/BLUE SHIELD | Attending: Obstetrics and Gynecology | Admitting: *Deleted

## 2018-06-05 ENCOUNTER — Encounter: Payer: Self-pay | Admitting: *Deleted

## 2018-06-05 VITALS — BP 118/70 | Ht 68.0 in | Wt 193.3 lb

## 2018-06-05 VITALS — BP 108/66 | Wt 192.0 lb

## 2018-06-05 DIAGNOSIS — Z3A31 31 weeks gestation of pregnancy: Secondary | ICD-10-CM

## 2018-06-05 DIAGNOSIS — O0993 Supervision of high risk pregnancy, unspecified, third trimester: Secondary | ICD-10-CM

## 2018-06-05 DIAGNOSIS — R1110 Vomiting, unspecified: Secondary | ICD-10-CM

## 2018-06-05 DIAGNOSIS — O219 Vomiting of pregnancy, unspecified: Secondary | ICD-10-CM

## 2018-06-05 DIAGNOSIS — F32A Depression, unspecified: Secondary | ICD-10-CM

## 2018-06-05 DIAGNOSIS — O99340 Other mental disorders complicating pregnancy, unspecified trimester: Secondary | ICD-10-CM

## 2018-06-05 DIAGNOSIS — Z3A00 Weeks of gestation of pregnancy not specified: Secondary | ICD-10-CM | POA: Diagnosis not present

## 2018-06-05 DIAGNOSIS — O24419 Gestational diabetes mellitus in pregnancy, unspecified control: Secondary | ICD-10-CM | POA: Diagnosis not present

## 2018-06-05 DIAGNOSIS — O99343 Other mental disorders complicating pregnancy, third trimester: Secondary | ICD-10-CM

## 2018-06-05 DIAGNOSIS — O0990 Supervision of high risk pregnancy, unspecified, unspecified trimester: Secondary | ICD-10-CM

## 2018-06-05 DIAGNOSIS — O24410 Gestational diabetes mellitus in pregnancy, diet controlled: Secondary | ICD-10-CM

## 2018-06-05 DIAGNOSIS — E8881 Metabolic syndrome: Secondary | ICD-10-CM

## 2018-06-05 DIAGNOSIS — Z713 Dietary counseling and surveillance: Secondary | ICD-10-CM | POA: Diagnosis not present

## 2018-06-05 DIAGNOSIS — F329 Major depressive disorder, single episode, unspecified: Secondary | ICD-10-CM

## 2018-06-05 HISTORY — DX: Vomiting, unspecified: R11.10

## 2018-06-05 MED ORDER — GLUCOSE BLOOD VI STRP: ORAL_STRIP | 12 refills | Status: DC

## 2018-06-05 MED ORDER — ACCU-CHEK FASTCLIX LANCETS MISC: 1.0000 | Freq: Four times a day (QID) | 12 refills | Status: DC

## 2018-06-05 MED ORDER — PROMETHAZINE HCL 25 MG PO TABS: 25.0000 mg | ORAL_TABLET | Freq: Four times a day (QID) | ORAL | 2 refills | Status: DC

## 2018-06-05 NOTE — Progress Notes (Signed)
Pt reports no problems. Diabetes testing supplies given.

## 2018-06-05 NOTE — Progress Notes (Signed)
Diabetes Self-Management Education  Visit Type: First/Initial  Appt. Start Time: 1315 Appt. End Time: 1445  06/05/2018  [name], identified by name and date of birth, is a [age] y.o. female with a diagnosis of Diabetes: Gestational Diabetes.   ASSESSMENT  Blood pressure 118/70, height 5\' 8"  (1.727 m), weight 193 lb 4.8 oz (87.7 kg), last menstrual period 10/28/2017, unknown if currently breastfeeding. Body mass index is 29.39 kg/m.  Diabetes Self-Management Education - 06/05/18 1429      Visit Information   Visit Type  First/Initial      Initial Visit   Diabetes Type  Gestational Diabetes    Are you currently following a meal plan?  Yes    What type of meal plan do you follow?  "Whatever doesn't make me sick"    Are you taking your medications as prescribed?  Yes She has to pick up Phenergan today that was just prescribed and Iron.    Date Diagnosed  2 days ago      Health Coping   How would you rate your overall health?  Good      Psychosocial Assessment   Patient Belief/Attitude about Diabetes  Other (comment) "frustrated"    Self-care barriers  None    Self-management support  Doctor's office;Family;Friends    Patient Concerns  Glycemic Control    Special Needs  None    Preferred Learning Style  Hands on    Learning Readiness  Ready    How often do you need to have someone help you when you read instructions, pamphlets, or other written materials from your doctor or pharmacy?  1 - Never    What is the last grade level you completed in school?  some college      Pre-Education Assessment   Patient understands the diabetes disease and treatment process.  Needs Review    Patient understands incorporating nutritional management into lifestyle.  Needs Instruction    Patient undertands incorporating physical activity into lifestyle.  Needs Review    Patient understands using medications safely.  Needs Instruction    Patient understands monitoring blood glucose,  interpreting and using results  Needs Review    Patient understands prevention, detection, and treatment of acute complications.  Needs Instruction    Patient understands prevention, detection, and treatment of chronic complications.  Needs Instruction    Patient understands how to develop strategies to address psychosocial issues.  Needs Instruction    Patient understands how to develop strategies to promote health/change behavior.  Needs Instruction      Complications   How often do you check your blood sugar?  0 times/day (not testing) Patient brought her Accu-Chek meter to the office. BG was 93 mg/dL at 1:612:10 pm - 2 hrs pp.     Have you had a dilated eye exam in the past 12 months?  Yes    Have you had a dental exam in the past 12 months?  Yes    Are you checking your feet?  No      Dietary Intake   Breakfast  skips or eats watermelon due to nausea; gets sick when preparing or talking about food    Lunch  peanut butter crustable, veggie delite sanwich     Dinner  cereal and milk (can tolerate more cold foods than hot) occasional cheese, peanut butter, peanuts, peas, spinach    Beverage(s)  water, fruit juice      Exercise   Exercise Type  ADL's  Patient Education   Previous Diabetes Education  No she has a friend that has Type 1    Disease state   Definition of diabetes, type 1 and 2, and the diagnosis of diabetes    Nutrition management   Food label reading, portion sizes and measuring food.;Reviewed blood glucose goals for pre and post meals and how to evaluate the patients' food intake on their blood glucose level. Taught an abbreviated version of meal planning due to pt's c/o of nausea when discussing foods.      Physical activity and exercise   Role of exercise on diabetes management, blood pressure control and cardiac health.    Monitoring  Taught/evaluated SMBG meter.;Purpose and frequency of SMBG.;Taught/discussed recording of test results and interpretation of SMBG.;Ketone  testing, when, how.    Chronic complications  Relationship between chronic complications and blood glucose control    Psychosocial adjustment  Identified and addressed patients feelings and concerns about diabetes    Preconception care  Pregnancy and GDM  Role of pre-pregnancy blood glucose control on the development of the fetus;Role of family planning for patients with diabetes;Reviewed with patient blood glucose goals with pregnancy      Individualized Goals (developed by patient)   Reducing Risk  Improve blood sugars     Outcomes   Expected Outcomes  Demonstrated interest in learning. Expect positive outcomes       Individualized Plan for Diabetes Self-Management Training:   Learning Objective:  Patient will have a greater understanding of diabetes self-management. Patient education plan is to attend individual and/or group sessions per assessed needs and concerns.   Plan:   Patient Instructions  Read booklet on Gestational Diabetes Follow Gestational Meal Planning Guidelines Avoid fruit juices Complete a 3 Day Food Record and bring to next appointment Check blood sugars 4 x day - before breakfast and 2 hrs after every meal and record  Bring blood sugar log to all appointments Purchase urine ketone strips if ordered by MD and check urine ketones every am:  If + increase bedtime snack to 1 protein and 2 carbohydrate servings Walk 20-30 minutes at least 5 x week if permitted by MD  Expected Outcomes:  Demonstrated interest in learning. Expect positive outcomes  Education material provided:  Gestational Booklet Gestational Meal Planning Guidelines Simple Meal Plan Viewed Gestational Diabetes Video Meter = Contour Next One 3 Day Food Record Goals for a Healthy Pregnancy Food Safety Handout  If problems or questions, patient to contact team via:  Sharion Settler, RN, CCM, CDE [phone]  Future DSME appointment:  June 15, 2018 with the dietitian

## 2018-06-05 NOTE — Progress Notes (Signed)
.   Routine Prenatal Care Visit  Subjective  Brianna Marshall is a 27 y.o. G1P0 at 2838w3d being seen today for ongoing prenatal care.  She is currently monitored for the following issues for this high-risk pregnancy and has Headache, migraine; PCOS (polycystic ovarian syndrome); Prediabetes; Vitamin D deficiency; Infertility, anovulation; Supervision of high risk pregnancy, antepartum; Insulin resistance; Depression affecting pregnancy; Nausea/vomiting in pregnancy; and Anemia in pregnancy on their problem list.  ----------------------------------------------------------------------------------- Patient reports vomiting, no every day but estimates every other. On days that she does vomit she reports 2-7 episodes of vomiting. She can not tolerate seeing food prepared. She can not stand the smell of cooking food. She says that she is frequently skipping meals because she can not tolerate large amounts of food. She is upset that the diagnosis of gestational diabetes has been added to her pregnancy burden. She feels that she has been eating as healthy as she can tolerate. She was tearful during visit.     .  .   . Denies leaking of fluid.  ----------------------------------------------------------------------------------- The following portions of the patient's history were reviewed and updated as appropriate: allergies, current medications, past family history, past medical history, past social history, past surgical history and problem list. Problem list updated.   Objective  Blood pressure 108/66, weight 192 lb (87.1 kg), last menstrual period 10/28/2017, unknown if currently breastfeeding. Pregravid weight 196 lb (88.9 kg) Total Weight Gain -4 lb (-1.814 kg) Urinalysis: Urine Protein: Negative Urine Glucose: Negative  Fetal Status:           General:  Alert, oriented and cooperative. Patient is in no acute distress.  Skin: Skin is warm and dry. No rash noted.   Cardiovascular: Normal heart  rate noted  Respiratory: Normal respiratory effort, no problems with respiration noted  Abdomen: Soft, gravid, appropriate for gestational age.       Pelvic:  Cervical exam deferred        Extremities: Normal range of motion.     ental Status: Normal mood and affect. Normal behavior. Normal judgment and thought content.     Assessment   27 y.o. G1P0 at 7438w3d by  08/04/2018, by Last Menstrual Period presenting for routine prenatal visit  Plan   pregnancy Problems (from 01/02/18 to present)    Problem Noted Resolved   Nausea/vomiting in pregnancy 01/17/2018 by Conard NovakJackson, Stephen D, MD No   Supervision of high risk pregnancy, antepartum 01/02/2018 by Conard NovakJackson, Stephen D, MD No   Overview Addendum 02/13/2018 10:05 AM by Nadara MustardHarris, Robert P, MD    Clinic [facility] Prenatal Labs  Dating Early [place] Blood type: O/Positive/-- (01/29 1038)   Genetic Screen 1 Screen: Neg Antibody:Negative (01/29 1038)  Anatomic US  Rubella: <0.90 (01/29 1038) Varicella: Imm  GTT Early: 123 Third trimester:  RPR: Non Reactive (01/29 1038)   Rhogam n/a HBsAg: Negative (01/29 1038)   TDaP vaccine            Flu Shot: 09/2017 HIV: Non Reactive (01/29 1038)   Baby Food                                GBS:   Contraception  Pap:12/2017 normal  CBB     CS/VBAC n/a   Support Person            Insulin resistance 01/02/2018 by Conard NovakJackson, Stephen D, MD No   Depression affecting pregnancy 01/02/2018 by Conard NovakJackson, Stephen D,  MD No   Overview Signed 01/02/2018  2:41 PM by Conard Novak, MD    - initial EPDS 21, patient wants to monitor during first trimester [ ]  consider starting zoloft second trimester.       Low-lying placenta 05/10/2018 by Natale Milch, MD 05/30/2018 by Natale Milch, MD   Overview Signed 05/10/2018 10:18 AM by Natale Milch, MD     Follow up at [redacted] weeks gestation          Gestational age appropriate obstetric precautions including but not limited to vaginal bleeding,  contractions, leaking of fluid and fetal movement were reviewed in detail with the patient.    Hyperemesis with low weight gain in pregnancy  Discussed hyperemesis with patient. She has lost a total of 4 lbs this pregnancy and has had continued nausea and vomiting. Discussed resources with the patient. Will add phenergan to her regimen. She reports that nexium has helped with heart burn.   Gestational Diabetes Given glucose log and meter. Orders placed for supplies. Patient has her appointment today with dietary and nutrition. Discussed goal values and  Asked patient to contact us if her levels are significantly above these goals in the next 2-3 days. Will have her follow up early next week to review her glucose log.  Return in 6 days (on 06/11/2018) for move appointment to monday .   Adelene Idler MD [facility], [facility] 06/05/18 12:30 PM

## 2018-06-05 NOTE — Patient Instructions (Addendum)
Read booklet on Gestational Diabetes Follow Gestational Meal Planning Guidelines Avoid fruit juices Complete a 3 Day Food Record and bring to next appointment Check blood sugars 4 x day - before breakfast and 2 hrs after every meal and record  Bring blood sugar log to all appointments Purchase urine ketone strips if ordered by MD and check urine ketones every am:  If + increase bedtime snack to 1 protein and 2 carbohydrate servings Walk 20-30 minutes at least 5 x week if permitted by MD

## 2018-06-11 ENCOUNTER — Encounter: Payer: Self-pay | Admitting: Obstetrics and Gynecology

## 2018-06-11 ENCOUNTER — Ambulatory Visit (INDEPENDENT_AMBULATORY_CARE_PROVIDER_SITE_OTHER): Payer: BLUE CROSS/BLUE SHIELD | Admitting: Obstetrics and Gynecology

## 2018-06-11 VITALS — BP 114/64 | Wt 192.0 lb

## 2018-06-11 DIAGNOSIS — O24419 Gestational diabetes mellitus in pregnancy, unspecified control: Secondary | ICD-10-CM | POA: Insufficient documentation

## 2018-06-11 DIAGNOSIS — O99013 Anemia complicating pregnancy, third trimester: Secondary | ICD-10-CM

## 2018-06-11 DIAGNOSIS — O24410 Gestational diabetes mellitus in pregnancy, diet controlled: Secondary | ICD-10-CM

## 2018-06-11 DIAGNOSIS — O24415 Gestational diabetes mellitus in pregnancy, controlled by oral hypoglycemic drugs: Secondary | ICD-10-CM | POA: Insufficient documentation

## 2018-06-11 DIAGNOSIS — O0990 Supervision of high risk pregnancy, unspecified, unspecified trimester: Secondary | ICD-10-CM

## 2018-06-11 DIAGNOSIS — O219 Vomiting of pregnancy, unspecified: Secondary | ICD-10-CM

## 2018-06-11 DIAGNOSIS — R1110 Vomiting, unspecified: Secondary | ICD-10-CM

## 2018-06-11 DIAGNOSIS — Z3A32 32 weeks gestation of pregnancy: Secondary | ICD-10-CM

## 2018-06-11 HISTORY — DX: Gestational diabetes mellitus in pregnancy, controlled by oral hypoglycemic drugs: O24.415

## 2018-06-11 HISTORY — DX: Gestational diabetes mellitus in pregnancy, unspecified control: O24.419

## 2018-06-11 NOTE — Progress Notes (Signed)
ROB

## 2018-06-11 NOTE — Progress Notes (Signed)
Routine Prenatal Care Visit  Subjective  Brianna Marshall is a 27 y.o. G1P0 at 2622w2d being seen today for ongoing prenatal care.  She is currently monitored for the following issues for this high-risk pregnancy and has Headache, migraine; PCOS (polycystic ovarian syndrome); Prediabetes; Vitamin D deficiency; Infertility, anovulation; Supervision of high risk pregnancy, antepartum; Insulin resistance; Depression affecting pregnancy; Nausea/vomiting in pregnancy; Anemia in pregnancy; Hyperemesis; and Gestational diabetes on their problem list.  ----------------------------------------------------------------------------------- Patient reports no complaints.   Contractions: Not present. Vag. Bleeding: None.  Movement: Present. Denies leaking of fluid.  ----------------------------------------------------------------------------------- The following portions of the patient's history were reviewed and updated as appropriate: allergies, current medications, past family history, past medical history, past social history, past surgical history and problem list. Problem list updated.   Objective  Blood pressure 114/64, weight 192 lb (87.1 kg), last menstrual period 10/28/2017, unknown if currently breastfeeding. Pregravid weight 196 lb (88.9 kg) Total Weight Gain -4 lb (-1.814 kg) Urinalysis: Urine Protein: Negative Urine Glucose: Negative  Fetal Status: Fetal Heart Rate (bpm): 148 Fundal Height: 32 cm Movement: Present     General:  Alert, oriented and cooperative. Patient is in no acute distress.  Skin: Skin is warm and dry. No rash noted.   Cardiovascular: Normal heart rate noted  Respiratory: Normal respiratory effort, no problems with respiration noted  Abdomen: Soft, gravid, appropriate for gestational age. Pain/Pressure: Absent     Pelvic:  Cervical exam deferred        Extremities: Normal range of motion.     ental Status: Normal mood and affect. Normal behavior. Normal judgment and  thought content.     Assessment   27 y.o. G1P0 at 3622w2d by  08/04/2018, by Last Menstrual Period presenting for routine prenatal visit  Plan   pregnancy Problems (from 01/02/18 to present)    Problem Noted Resolved   Gestational diabetes 06/11/2018 by Natale MilchSchuman, Annette Bertelson R, MD No   Hyperemesis 06/05/2018 by Natale MilchSchuman, Fe Okubo R, MD No   Nausea/vomiting in pregnancy 01/17/2018 by Conard NovakJackson, Stephen D, MD No   Supervision of high risk pregnancy, antepartum 01/02/2018 by Conard NovakJackson, Stephen D, MD No   Overview Addendum 02/13/2018 10:05 AM by Nadara MustardHarris, Robert P, MD    Clinic [facility] Prenatal Labs  Dating Early US Blood type: O/Positive/-- (01/29 1038)   Genetic Screen 1 Screen: Neg Antibody:Negative (01/29 1038)  Anatomic US  Rubella: <0.90 (01/29 1038) Varicella: Imm  GTT Early: 123 Third trimester:  RPR: Non Reactive (01/29 1038)   Rhogam n/a HBsAg: Negative (01/29 1038)   TDaP vaccine            Flu Shot: 09/2017 HIV: Non Reactive (01/29 1038)   Baby Food                                GBS:   Contraception  Pap:12/2017 normal  CBB     CS/VBAC n/a   Support Person            Insulin resistance 01/02/2018 by Conard NovakJackson, Stephen D, MD No   Depression affecting pregnancy 01/02/2018 by Conard NovakJackson, Stephen D, MD No   Overview Signed 01/02/2018  2:41 PM by Conard NovakJackson, Stephen D, MD    - initial EPDS 21, patient wants to monitor during first trimester [ ]  consider starting zoloft second trimester.       Low-lying placenta 05/10/2018 by Natale MilchSchuman, Rexanne Inocencio R, MD 05/30/2018 by Natale MilchSchuman, Alessia Gonsalez R, MD  Overview Signed 05/10/2018 10:18 AM by Natale Milch, MD     Follow up at [redacted] weeks gestation          Gestational age appropriate obstetric precautions including but not limited to vaginal bleeding, contractions, leaking of fluid and fetal movement were reviewed in detail with the patient.    Patient doing well. Nausea and vomiting has been improved to vomiting 4 days in a week and only once  in the morning. She reports less nausea triggers as well. Currently her only nausea trigger is the heat. Glucose log reviewed, There is an elevation in her AM fasting levels, but almost all post-prandials are within normal limits.no medication at this time.   Return in about 1 week (around 06/18/2018) for ROB.  Adelene Idler MD [facility], [facility] 06/11/18 8:43 AM

## 2018-06-13 ENCOUNTER — Encounter: Payer: BLUE CROSS/BLUE SHIELD | Admitting: Advanced Practice Midwife

## 2018-06-15 ENCOUNTER — Ambulatory Visit: Payer: BLUE CROSS/BLUE SHIELD | Admitting: Dietician

## 2018-06-15 ENCOUNTER — Encounter: Payer: Self-pay | Admitting: Obstetrics and Gynecology

## 2018-06-18 ENCOUNTER — Ambulatory Visit (INDEPENDENT_AMBULATORY_CARE_PROVIDER_SITE_OTHER): Payer: BLUE CROSS/BLUE SHIELD | Admitting: Certified Nurse Midwife

## 2018-06-18 VITALS — BP 110/70 | HR 105 | Wt 192.0 lb

## 2018-06-18 DIAGNOSIS — R0789 Other chest pain: Secondary | ICD-10-CM

## 2018-06-18 DIAGNOSIS — O24410 Gestational diabetes mellitus in pregnancy, diet controlled: Secondary | ICD-10-CM

## 2018-06-18 DIAGNOSIS — O0990 Supervision of high risk pregnancy, unspecified, unspecified trimester: Secondary | ICD-10-CM

## 2018-06-18 DIAGNOSIS — Z3A33 33 weeks gestation of pregnancy: Secondary | ICD-10-CM

## 2018-06-18 MED ORDER — LIDOCAINE 5 % EX PTCH: 1.0000 | MEDICATED_PATCH | CUTANEOUS | 2 refills | Status: DC

## 2018-06-18 NOTE — Progress Notes (Signed)
Pt c/o pain on right side in rib cage. FKC's given.

## 2018-06-19 ENCOUNTER — Encounter: Payer: Self-pay | Admitting: Dietician

## 2018-06-19 ENCOUNTER — Encounter: Payer: BLUE CROSS/BLUE SHIELD | Attending: Obstetrics and Gynecology | Admitting: Dietician

## 2018-06-19 VITALS — BP 98/76 | Ht 68.0 in | Wt 191.0 lb

## 2018-06-19 DIAGNOSIS — Z713 Dietary counseling and surveillance: Secondary | ICD-10-CM | POA: Diagnosis not present

## 2018-06-19 DIAGNOSIS — Z3A00 Weeks of gestation of pregnancy not specified: Secondary | ICD-10-CM | POA: Diagnosis not present

## 2018-06-19 DIAGNOSIS — O24410 Gestational diabetes mellitus in pregnancy, diet controlled: Secondary | ICD-10-CM

## 2018-06-19 DIAGNOSIS — O24419 Gestational diabetes mellitus in pregnancy, unspecified control: Secondary | ICD-10-CM | POA: Diagnosis not present

## 2018-06-19 NOTE — Progress Notes (Signed)
   Patient's BG record indicates BGs close to goal, with fasting BGs ranging 85-100, and post-meal BGs ranging 79-130 + 3 out of 142 readings between 140 and 150.   Patient's food diary indicates starchy foods in sometimes varying amounts according to nausea symptoms. Meats and vegetables do not settle well for patient, and often fruits. She does at times crave sweets. And will drink regular Dr. Reino KentPepper when feeling nauseated as this is the only thing that seems to help.    Provided 1900kcal meal plan and discussed appropriate carb intake as well as options for proteins.  Instructed patient on food safety, including avoidance of Listeriosis, and limiting mercury from fish.  Discussed importance of maintaining healthy lifestyle habits to reduce risk of Type 2 DM as well as Gestational DM with any future pregnancies.  Advised patient to use any remaining testing supplies to test some BGs after delivery, and to have BG tested ideally annually, as well as prior to attempting future pregnancies.

## 2018-06-19 NOTE — Patient Instructions (Signed)
   Keep eating foods that are helpful in controlling nausea, and keep total carbs to about 45grams at a time.   Try walking/ light activity for 10-15 minutes after eating if you have eaten extra carbs.   If blood sugar drops low (under 65), drink 4oz of juice or regular soda, allow 10-15 minutes to raise blood sugar, then follow with a meal or snack that includes protein within 30 minutes.

## 2018-06-22 ENCOUNTER — Other Ambulatory Visit: Payer: Self-pay | Admitting: Obstetrics and Gynecology

## 2018-06-22 DIAGNOSIS — O0993 Supervision of high risk pregnancy, unspecified, third trimester: Secondary | ICD-10-CM

## 2018-06-24 NOTE — Progress Notes (Signed)
HROB at 33wk2days: GDM on diet. Doing better with diet and 3 of 7 fasting CBGs were elevated, range 85-99 2 hour pp 2/18 >120, range 79-147 (elevations after eating foods not on diet). Baby active. FHTs WNL. Complains of pain in a local area in the lower right rib cage-started one week ago, worsened 4 days ago. Hurts worse with laughing, hiccups, or pressure. Ice helps. Tylenol not helpful Local tenderness to palpation over intercostal area lower right rib cage. Negative Murphy's sign. No nausea and vomiting with pain Discussed ice, heat, Biofreeze. RX for Lidoderm patch-apply x 12 hours, then off 12 hours ROB, growth scan in 1 week

## 2018-06-26 ENCOUNTER — Ambulatory Visit (INDEPENDENT_AMBULATORY_CARE_PROVIDER_SITE_OTHER): Payer: BLUE CROSS/BLUE SHIELD

## 2018-06-26 ENCOUNTER — Ambulatory Visit (INDEPENDENT_AMBULATORY_CARE_PROVIDER_SITE_OTHER): Payer: BLUE CROSS/BLUE SHIELD | Admitting: Obstetrics and Gynecology

## 2018-06-26 ENCOUNTER — Encounter: Payer: Self-pay | Admitting: Obstetrics and Gynecology

## 2018-06-26 VITALS — BP 110/72 | HR 99 | Wt 196.5 lb

## 2018-06-26 DIAGNOSIS — Z3A34 34 weeks gestation of pregnancy: Secondary | ICD-10-CM | POA: Diagnosis not present

## 2018-06-26 DIAGNOSIS — F32A Depression, unspecified: Secondary | ICD-10-CM

## 2018-06-26 DIAGNOSIS — O99340 Other mental disorders complicating pregnancy, unspecified trimester: Secondary | ICD-10-CM

## 2018-06-26 DIAGNOSIS — O24410 Gestational diabetes mellitus in pregnancy, diet controlled: Secondary | ICD-10-CM | POA: Diagnosis not present

## 2018-06-26 DIAGNOSIS — R1011 Right upper quadrant pain: Secondary | ICD-10-CM

## 2018-06-26 DIAGNOSIS — O99343 Other mental disorders complicating pregnancy, third trimester: Secondary | ICD-10-CM

## 2018-06-26 DIAGNOSIS — F329 Major depressive disorder, single episode, unspecified: Secondary | ICD-10-CM

## 2018-06-26 DIAGNOSIS — O0993 Supervision of high risk pregnancy, unspecified, third trimester: Secondary | ICD-10-CM

## 2018-06-26 DIAGNOSIS — O210 Mild hyperemesis gravidarum: Secondary | ICD-10-CM

## 2018-06-26 DIAGNOSIS — O0990 Supervision of high risk pregnancy, unspecified, unspecified trimester: Secondary | ICD-10-CM

## 2018-06-26 DIAGNOSIS — O99013 Anemia complicating pregnancy, third trimester: Secondary | ICD-10-CM

## 2018-06-26 NOTE — Progress Notes (Addendum)
Routine Prenatal Care Visit  Subjective  Brianna Marshall is a 27 y.o. G1P0 at 46108w5d being seen today for ongoing prenatal care.  She is currently monitored for the following issues for this high-risk pregnancy and has Headache, migraine; PCOS (polycystic ovarian syndrome); Prediabetes; Vitamin D deficiency; Infertility, anovulation; Supervision of high risk pregnancy, antepartum; Insulin resistance; Depression affecting pregnancy; Nausea/vomiting in pregnancy; Anemia in pregnancy; Hyperemesis; and Gestational diabetes on their problem list.  ----------------------------------------------------------------------------------- Patient reports: continues to have right upper quadrant pain. Hurst with certain activities and positions.  Not affected by eating.  Pain originally over ribs, but now is actually in her abdomen. No fevers or chills.  Denies nausea, vomiting. Ice helps, heating pad helps. Tylenol does not help.  No other associated symptoms.  Contractions: Not present. Vag. Bleeding: None.  Movement: Present. Denies leaking of fluid.  Growth today 73rd%ile, AFI 20.35 cm GDM: did not bring BG log today. This is, however, a highly reliable patient who is well-known to me. She reports nearly all values are normal. Her fasting values tend to be borderline, but normal. She will send me a photo of her BG log.  ----------------------------------------------------------------------------------- The following portions of the patient's history were reviewed and updated as appropriate: allergies, current medications, past family history, past medical history, past social history, past surgical history and problem list. Problem list updated.   Objective  Blood pressure 110/72, pulse 99, weight 196 lb 8 oz (89.1 kg), last menstrual period 10/28/2017, unknown if currently breastfeeding. Pregravid weight 196 lb (88.9 kg) Total Weight Gain 8 oz (0.227 kg) Urinalysis:      Fetal Status: Fetal Heart Rate  (bpm): 141   Movement: Present  Presentation: Vertex  General:  Alert, oriented and cooperative. Patient is in no acute distress.  Skin: Skin is warm and dry. No rash noted.   Cardiovascular: Normal heart rate noted  Respiratory: Normal respiratory effort, no problems with respiration noted  Abdomen: Soft, gravid, appropriate for gestational age. Pain/Pressure: Absent  TTP in RUQ, +Murphy's sign.  She is also tender along her actual ribs.  No rebound or guarding.  Pelvic:  Cervical exam deferred        Extremities: Normal range of motion.     Mental Status: Normal mood and affect. Normal behavior. Normal judgment and thought content.   [place] Ob Follow Up  Result Date: 06/26/2018 ULTRASOUND REPORT Location: [facility] Date of Service: 06/26/2018 Patient Name: Brianna AskewChristine S Marshall DOB: 1991-11-26 MRN: [id number] Indications:growth/afi for GDM Findings: Mason JimSingleton intrauterine pregnancy is visualized with FHR at 141 BPM. Biometrics give an (U/S) Gestational age of 5216w0d and an (U/S) EDD of 07/24/2018; this correlates with the clinically established Estimated Date of Delivery: 08/04/18. Fetal presentation is Cephalic. Placenta: Anterior, Grade 2. AFI: 20.35 cm Growth percentile is 73.1 %. EFW: 2843 grams (6lb 4oz) Impression: 1. 4792w3d Viable Singleton Intrauterine pregnancy previously established criteria. 2. Growth is 73.1 %ile.  AFI is 20.35 cm. Recommendations: 1.Clinical correlation with the patient's History and Physical Exam. Mital bahen Leodis BinetP Patel, RDMS The ultrasound images and findings were reviewed by me and I agree with the above report. Thomasene MohairStephen Jackson, MD, Merlinda FrederickFACOG [facility], [facility] 06/26/2018 4:20 PM    Assessment   27 y.o. G1P0 at 59108w5d by  08/04/2018, by Last Menstrual Period presenting for routine prenatal visit  Plan   pregnancy Problems (from 01/02/18 to present)    Problem Noted Resolved   Gestational diabetes 06/11/2018 by Natale MilchSchuman, Christanna R, MD No  Hyperemesis  06/05/2018 by Natale Milch, MD No   Nausea/vomiting in pregnancy 01/17/2018 by Conard Novak, MD No   Supervision of high risk pregnancy, antepartum 01/02/2018 by Conard Novak, MD No   Overview Addendum 02/13/2018 10:05 AM by Nadara Mustard, MD    Clinic [facility] Prenatal Labs  Dating Early [place] Blood type: O/Positive/-- (01/29 1038)   Genetic Screen 1 Screen: Neg Antibody:Negative (01/29 1038)  Anatomic [place]  Rubella: <0.90 (01/29 1038) Varicella: Imm  GTT Early: 123 Third trimester:  RPR: Non Reactive (01/29 1038)   Rhogam n/a HBsAg: Negative (01/29 1038)   TDaP vaccine            Flu Shot: 09/2017 HIV: Non Reactive (01/29 1038)   Baby Food                                GBS:   Contraception  Pap:12/2017 normal  CBB     CS/VBAC n/a   Support Person            Insulin resistance 01/02/2018 by Conard Novak, MD No   Depression affecting pregnancy 01/02/2018 by Conard Novak, MD No   Overview Signed 01/02/2018  2:41 PM by Conard Novak, MD    - initial EPDS 21, patient wants to monitor during first trimester [ ]  consider starting zoloft second trimester.       Low-lying placenta 05/10/2018 by Natale Milch, MD 05/30/2018 by Natale Milch, MD   Overview Signed 05/10/2018 10:18 AM by Natale Milch, MD     Follow up at [redacted] weeks gestation          Preterm labor symptoms and general obstetric precautions including but not limited to vaginal bleeding, contractions, leaking of fluid and fetal movement were reviewed in detail with the patient. Please refer to After Visit Summary for other counseling recommendations.   - GDM: continue to keep bg log. - RUQ pain. Will obtain RUQ U/S.   Return in about 2 weeks (around 07/10/2018) for Routine Prenatal Appointment.  Thomasene Mohair, MD, [facility], [facility] 06/28/2018 11:18 AM

## 2018-06-27 ENCOUNTER — Ambulatory Visit: Payer: BLUE CROSS/BLUE SHIELD | Admitting: Dietician

## 2018-06-28 ENCOUNTER — Encounter: Payer: Self-pay | Admitting: Obstetrics and Gynecology

## 2018-06-28 NOTE — Progress Notes (Incomplete)
Routine Prenatal Care Visit  Subjective  Brianna Marshall is a 27 y.o. G1P0 at [redacted]w[redacted]d being seen today for ongoing prenatal care.  She is currently monitored for the following issues for this {Blank single:19197::"high-risk","low-risk"} pregnancy and has Headache, migraine; PCOS (polycystic ovarian syndrome); Prediabetes; Vitamin D deficiency; Infertility, anovulation; Supervision of high risk pregnancy, antepartum; Insulin resistance; Depression affecting pregnancy; Nausea/vomiting in pregnancy; Anemia in pregnancy; Hyperemesis; and Gestational diabetes on their problem list.  ----------------------------------------------------------------------------------- Patient reports {sx:14538}.   Contractions: Not present. Vag. Bleeding: None.  Movement: Present. Denies leaking of fluid.  ----------------------------------------------------------------------------------- The following portions of the patient's history were reviewed and updated as appropriate: allergies, current medications, past family history, past medical history, past social history, past surgical history and problem list. Problem list updated.   Objective  Blood pressure 110/72, pulse 99, weight 196 lb 8 oz (89.1 kg), last menstrual period 10/28/2017, unknown if currently breastfeeding. Pregravid weight 196 lb (88.9 kg) Total Weight Gain 8 oz (0.227 kg) Urinalysis:      Fetal Status: Fetal Heart Rate (bpm): 141   Movement: Present  Presentation: Vertex  General:  Alert, oriented and cooperative. Patient is in no acute distress.  Skin: Skin is warm and dry. No rash noted.   Cardiovascular: Normal heart rate noted  Respiratory: Normal respiratory effort, no problems with respiration noted  Abdomen: Soft, gravid, appropriate for gestational age. Pain/Pressure: Absent     Pelvic:  {Blank single:19197::"Cervical exam performed","Cervical exam deferred"}        Extremities: Normal range of motion.     Mental Status: Normal mood  and affect. Normal behavior. Normal judgment and thought content.   Assessment   27 y.o. G1P0 at [redacted]w[redacted]d by  08/04/2018, by Last Menstrual Period presenting for {Blank single:19197::"routine","work-in"} prenatal visit  Plan   pregnancy Problems (from 01/02/18 to present)    Problem Noted Resolved   Gestational diabetes 06/11/2018 by Natale Milch, MD No   Hyperemesis 06/05/2018 by Natale Milch, MD No   Nausea/vomiting in pregnancy 01/17/2018 by Conard Novak, MD No   Supervision of high risk pregnancy, antepartum 01/02/2018 by Conard Novak, MD No   Overview Addendum 02/13/2018 10:05 AM by Nadara Mustard, MD    Clinic [facility] Prenatal Labs  Dating Early [place] Blood type: O/Positive/-- (01/29 1038)   Genetic Screen 1 Screen: Neg Antibody:Negative (01/29 1038)  Anatomic [place]  Rubella: <0.90 (01/29 1038) Varicella: Imm  GTT Early: 123 Third trimester:  RPR: Non Reactive (01/29 1038)   Rhogam n/a HBsAg: Negative (01/29 1038)   TDaP vaccine            Flu Shot: 09/2017 HIV: Non Reactive (01/29 1038)   Baby Food                                GBS:   Contraception  Pap:12/2017 normal  CBB     CS/VBAC n/a   Support Person            Insulin resistance 01/02/2018 by Conard Novak, MD No   Depression affecting pregnancy 01/02/2018 by Conard Novak, MD No   Overview Signed 01/02/2018  2:41 PM by Conard Novak, MD    - initial EPDS 21, patient wants to monitor during first trimester [ ]  consider starting zoloft second trimester.       Low-lying placenta 05/10/2018 by Natale Milch, MD 05/30/2018 by Natale Milch, MD  Overview Signed 05/10/2018 10:18 AM by Natale MilchSchuman, Christanna R, MD     Follow up at [redacted] weeks gestation          {Blank single:19197::"Term","Preterm"} labor symptoms and general obstetric precautions including but not limited to vaginal bleeding, contractions, leaking of fluid and fetal movement were reviewed in detail with the  patient. Please refer to After Visit Summary for other counseling recommendations.   Return in about 2 weeks (around 07/10/2018) for Routine Prenatal Appointment.  Thomasene MohairStephen Jackson, MD, Merlinda FrederickFACOG [facility], [facility] 06/28/2018 11:18 AM

## 2018-06-28 NOTE — Progress Notes (Deleted)
Routine Prenatal Care Visit  * this note was written by me, but originally started under the CMA.  I created the entire note and all documentation herein. I had to copy my note from the note begun by the CMA in order to put the note in my name.    Subjective  Brianna AskewChristine S Janczak is a 27 y.o. G1P0 at 3227w5d being seen today for ongoing prenatal care.  She is currently monitored for the following issues for this high-risk pregnancy and has Headache, migraine; PCOS (polycystic ovarian syndrome); Prediabetes; Vitamin D deficiency; Infertility, anovulation; Supervision of high risk pregnancy, antepartum; Insulin resistance; Depression affecting pregnancy; Nausea/vomiting in pregnancy; Anemia in pregnancy; Hyperemesis; and Gestational diabetes on their problem list.  ----------------------------------------------------------------------------------- Patient reports: continues to have right upper quadrant pain. Hurst with certain activities and positions.  Not affected by eating.  Pain originally over ribs, but now is actually in her abdomen. No fevers or chills.  Denies nausea, vomiting. Ice helps, heating pad helps. Tylenol does not help.  No other associated symptoms.  Contractions: Not present. Vag. Bleeding: None.  Movement: Present. Denies leaking of fluid.  Growth today 73rd%ile, AFI 20.35 cm GDM: did not bring BG log today. This is, however, a highly reliable patient who is well-known to me. She reports nearly all values are normal. Her fasting values tend to be borderline, but normal. She will send me a photo of her BG log.  ----------------------------------------------------------------------------------- The following portions of the patient's history were reviewed and updated as appropriate: allergies, current medications, past family history, past medical history, past social history, past surgical history and problem list. Problem list updated.   Objective  Blood pressure 110/72, pulse 99,  weight 196 lb 8 oz (89.1 kg), last menstrual period 10/28/2017, unknown if currently breastfeeding. Pregravid weight 196 lb (88.9 kg) Total Weight Gain 8 oz (0.227 kg) Urinalysis:      Fetal Status: Fetal Heart Rate (bpm): 141   Movement: Present  Presentation: Vertex  General:  Alert, oriented and cooperative. Patient is in no acute distress.  Skin: Skin is warm and dry. No rash noted.   Cardiovascular: Normal heart rate noted  Respiratory: Normal respiratory effort, no problems with respiration noted  Abdomen: Soft, gravid, appropriate for gestational age. Pain/Pressure: Absent  TTP in RUQ, +Murphy's sign.  She is also tender along her actual ribs.  No rebound or guarding.  Pelvic:  Cervical exam deferred        Extremities: Normal range of motion.     Mental Status: Normal mood and affect. Normal behavior. Normal judgment and thought content.   [place] Ob Follow Up  Result Date: 06/26/2018 ULTRASOUND REPORT Location: [facility] Date of Service: 06/26/2018 Patient Name: Brianna AskewChristine S Orea DOB: 1991/03/25 MRN: [id number] Indications:growth/afi for GDM Findings: Mason JimSingleton intrauterine pregnancy is visualized with FHR at 141 BPM. Biometrics give an (U/S) Gestational age of 8596w0d and an (U/S) EDD of 07/24/2018; this correlates with the clinically established Estimated Date of Delivery: 08/04/18. Fetal presentation is Cephalic. Placenta: Anterior, Grade 2. AFI: 20.35 cm Growth percentile is 73.1 %. EFW: 2843 grams (6lb 4oz) Impression: 1. 2744w3d Viable Singleton Intrauterine pregnancy previously established criteria. 2. Growth is 73.1 %ile.  AFI is 20.35 cm. Recommendations: 1.Clinical correlation with the patient's History and Physical Exam. Mital bahen Leodis BinetP Patel, RDMS The ultrasound images and findings were reviewed by me and I agree with the above report. Thomasene MohairStephen Nadezhda Pollitt, MD, Merlinda FrederickFACOG [facility], [facility] 06/26/2018 4:20 PM  Assessment   27 y.o. G1P0 at [redacted]w[redacted]d by  08/04/2018, by Last  Menstrual Period presenting for routine prenatal visit  Plan   pregnancy Problems (from 01/02/18 to present)    Problem Noted Resolved   Gestational diabetes 06/11/2018 by Natale Milch, MD No   Hyperemesis 06/05/2018 by Natale Milch, MD No   Nausea/vomiting in pregnancy 01/17/2018 by Conard Novak, MD No   Supervision of high risk pregnancy, antepartum 01/02/2018 by Conard Novak, MD No   Overview Addendum 02/13/2018 10:05 AM by Nadara Mustard, MD    Clinic [facility] Prenatal Labs  Dating Early [place] Blood type: O/Positive/-- (01/29 1038)   Genetic Screen 1 Screen: Neg Antibody:Negative (01/29 1038)  Anatomic [place]  Rubella: <0.90 (01/29 1038) Varicella: Imm  GTT Early: 123 Third trimester:  RPR: Non Reactive (01/29 1038)   Rhogam n/a HBsAg: Negative (01/29 1038)   TDaP vaccine            Flu Shot: 09/2017 HIV: Non Reactive (01/29 1038)   Baby Food                                GBS:   Contraception  Pap:12/2017 normal  CBB     CS/VBAC n/a   Support Person            Insulin resistance 01/02/2018 by Conard Novak, MD No   Depression affecting pregnancy 01/02/2018 by Conard Novak, MD No   Overview Signed 01/02/2018  2:41 PM by Conard Novak, MD    - initial EPDS 21, patient wants to monitor during first trimester [ ]  consider starting zoloft second trimester.       Low-lying placenta 05/10/2018 by Natale Milch, MD 05/30/2018 by Natale Milch, MD   Overview Signed 05/10/2018 10:18 AM by Natale Milch, MD     Follow up at [redacted] weeks gestation          Preterm labor symptoms and general obstetric precautions including but not limited to vaginal bleeding, contractions, leaking of fluid and fetal movement were reviewed in detail with the patient. Please refer to After Visit Summary for other counseling recommendations.   - GDM: continue to keep bg log. - RUQ pain. Will obtain RUQ U/S.   Return in about 2 weeks (around  07/10/2018) for Routine Prenatal Appointment.  Thomasene Mohair, MD, [facility], [facility] 06/28/2018 11:23 AM

## 2018-06-29 ENCOUNTER — Ambulatory Visit
Admission: RE | Admit: 2018-06-29 | Discharge: 2018-06-29 | Disposition: A | Discharge status: Home / Self Care | Payer: BLUE CROSS/BLUE SHIELD | Source: Ambulatory Visit | Attending: Obstetrics and Gynecology | Admitting: Obstetrics and Gynecology

## 2018-06-29 DIAGNOSIS — Z3A00 Weeks of gestation of pregnancy not specified: Secondary | ICD-10-CM | POA: Diagnosis not present

## 2018-06-29 DIAGNOSIS — O26899 Other specified pregnancy related conditions, unspecified trimester: Secondary | ICD-10-CM | POA: Diagnosis not present

## 2018-06-29 DIAGNOSIS — O0990 Supervision of high risk pregnancy, unspecified, unspecified trimester: Secondary | ICD-10-CM | POA: Diagnosis present

## 2018-06-29 DIAGNOSIS — R1011 Right upper quadrant pain: Secondary | ICD-10-CM | POA: Diagnosis not present

## 2018-07-05 ENCOUNTER — Encounter: Payer: Self-pay | Admitting: Obstetrics and Gynecology

## 2018-07-05 ENCOUNTER — Other Ambulatory Visit: Payer: Self-pay | Admitting: Obstetrics and Gynecology

## 2018-07-05 DIAGNOSIS — O219 Vomiting of pregnancy, unspecified: Secondary | ICD-10-CM

## 2018-07-05 DIAGNOSIS — O0990 Supervision of high risk pregnancy, unspecified, unspecified trimester: Secondary | ICD-10-CM

## 2018-07-05 MED ORDER — ONDANSETRON 8 MG PO TBDP: 8.0000 mg | ORAL_TABLET | Freq: Three times a day (TID) | ORAL | 1 refills | Status: DC | PRN

## 2018-07-10 ENCOUNTER — Ambulatory Visit (INDEPENDENT_AMBULATORY_CARE_PROVIDER_SITE_OTHER): Payer: BLUE CROSS/BLUE SHIELD | Admitting: Obstetrics and Gynecology

## 2018-07-10 VITALS — BP 126/80 | Wt 198.0 lb

## 2018-07-10 DIAGNOSIS — O0990 Supervision of high risk pregnancy, unspecified, unspecified trimester: Secondary | ICD-10-CM

## 2018-07-10 DIAGNOSIS — O24410 Gestational diabetes mellitus in pregnancy, diet controlled: Secondary | ICD-10-CM

## 2018-07-10 DIAGNOSIS — Z3A36 36 weeks gestation of pregnancy: Secondary | ICD-10-CM

## 2018-07-10 DIAGNOSIS — Z3685 Encounter for antenatal screening for Streptococcus B: Secondary | ICD-10-CM

## 2018-07-10 DIAGNOSIS — E282 Polycystic ovarian syndrome: Secondary | ICD-10-CM

## 2018-07-10 NOTE — Progress Notes (Signed)
Routine Prenatal Care Visit  Subjective  Brianna AskewChristine S Parekh is a 27 y.o. G1P0 at 3389w3d being seen today for ongoing prenatal care.  She is currently monitored for the following issues for this high-risk pregnancy and has Headache, migraine; PCOS (polycystic ovarian syndrome); Prediabetes; Vitamin D deficiency; Infertility, anovulation; Supervision of high risk pregnancy, antepartum; Insulin resistance; Depression affecting pregnancy; Nausea/vomiting in pregnancy; Anemia in pregnancy; Hyperemesis; and Gestational diabetes on their problem list.  ----------------------------------------------------------------------------------- Patient reports no complaints.   Contractions: Irregular. Vag. Bleeding: None.  Movement: Present. Denies leaking of fluid.  ----------------------------------------------------------------------------------- The following portions of the patient's history were reviewed and updated as appropriate: allergies, current medications, past family history, past medical history, past social history, past surgical history and problem list. Problem list updated.   Objective  Blood pressure 126/80, weight 198 lb (89.8 kg), last menstrual period 10/28/2017, unknown if currently breastfeeding. Pregravid weight 196 lb (88.9 kg) Total Weight Gain 2 lb (0.907 kg) Urinalysis: Urine Protein: Negative Urine Glucose: Negative  Fetal Status: Fetal Heart Rate (bpm): 140 Fundal Height: 37 cm Movement: Present  Presentation: Vertex  General:  Alert, oriented and cooperative. Patient is in no acute distress.  Skin: Skin is warm and dry. No rash noted.   Cardiovascular: Normal heart rate noted  Respiratory: Normal respiratory effort, no problems with respiration noted  Abdomen: Soft, gravid, appropriate for gestational age. Pain/Pressure: Absent     Pelvic:  Cervical exam performed Dilation: Closed      Extremities: Normal range of motion.     ental Status: Normal mood and affect. Normal  behavior. Normal judgment and thought content.    [place] Ob Follow Up  Result Date: 06/26/2018 ULTRASOUND REPORT Location: [facility] Date of Service: 06/26/2018 Patient Name: Brianna Marshall DOB: [birth year] MRN: [id number] Indications:growth/afi for GDM Findings: Mason JimSingleton intrauterine pregnancy is visualized with FHR at 141 BPM. Biometrics give an (U/S) Gestational age of 7451w0d and an (U/S) EDD of 07/24/2018; this correlates with the clinically established Estimated Date of Delivery: 08/04/18. Fetal presentation is Cephalic. Placenta: Anterior, Grade 2. AFI: 20.35 cm Growth percentile is 73.1 %. EFW: 2843 grams (6lb 4oz) Impression: 1. 2963w3d Viable Singleton Intrauterine pregnancy previously established criteria. 2. Growth is 73.1 %ile.  AFI is 20.35 cm. Recommendations: 1.Clinical correlation with the patient's History and Physical Exam. Mital bahen Leodis BinetP Patel, RDMS The ultrasound images and findings were reviewed by me and I agree with the above report. Thomasene MohairStephen Jackson, MD, Merlinda FrederickFACOG [facility], [facility] 06/26/2018 4:20 PM        Assessment   27 y.o. G1P0 at 6189w3d by  08/04/2018, by Last Menstrual Period presenting for routine prenatal visit  Plan   pregnancy Problems (from 01/02/18 to present)    Problem Noted Resolved   Gestational diabetes 06/11/2018 by Natale MilchSchuman, Christanna R, MD No   Hyperemesis 06/05/2018 by Natale MilchSchuman, Christanna R, MD No   Nausea/vomiting in pregnancy 01/17/2018 by Conard NovakJackson, Stephen D, MD No   Supervision of high risk pregnancy, antepartum 01/02/2018 by Conard NovakJackson, Stephen D, MD No   Overview Addendum 02/13/2018 10:05 AM by Nadara MustardHarris, Robert P, MD    [facility]inic [facility] Prenatal Labs  Dating Early [place] Blood type: O/Positive/-- (01/29 1038)   Genetic Screen 1 Screen: Neg Antibody:Negative (01/29 1038)  Anatomic US  Rubella: <0.90 (01/29 1038) Varicella: Imm  GTT Early: 123 Third trimester:  RPR: Non Reactive (01/29 1038)   Rhogam n/a HBsAg: Negative (01/29 1038)     TDaP vaccine  Flu Shot: 09/2017 HIV: Non Reactive (01/29 1038)   Baby Food                                GBS:   Contraception  Pap:12/2017 normal  CBB     CS/VBAC n/a   Support Person            Insulin resistance 01/02/2018 by Conard Novak, MD No   Depression affecting pregnancy 01/02/2018 by Conard Novak, MD No   Overview Signed 01/02/2018  2:41 PM by Conard Novak, MD    - initial EPDS 21, patient wants to monitor during first trimester [ ]  consider starting zoloft second trimester.       Low-lying placenta 05/10/2018 by Natale Milch, MD 05/30/2018 by Natale Milch, MD   Overview Signed 05/10/2018 10:18 AM by Natale Milch, MD     Follow up at [redacted] weeks gestation          Gestational age appropriate obstetric precautions including but not limited to vaginal bleeding, contractions, leaking of fluid and fetal movement were reviewed in detail with the patient.    - GBS with sensitivities given PCN allergy - BG at goal but some mild elevations in fasting BG are evident, prior growth scan reviewed EFW 73%ile.  Consider IOL 39 weeks, consider addition of antihyperglycemic agent in the evening if fasting trend continues - Still some RUQ pain discussed Tietze syndrome  Return in about 1 week (around 07/17/2018) for ROB.  Vena Austria, MD, Evern [facility], [facility] 07/10/2018, 10:26 AM

## 2018-07-10 NOTE — Progress Notes (Signed)
ROB GBS 

## 2018-07-11 ENCOUNTER — Encounter: Payer: Self-pay | Admitting: Obstetrics and Gynecology

## 2018-07-14 LAB — STREP GP B CULTURE+RFLX: Strep Gp B Culture+Rflx: NEGATIVE

## 2018-07-17 ENCOUNTER — Ambulatory Visit (INDEPENDENT_AMBULATORY_CARE_PROVIDER_SITE_OTHER): Payer: BLUE CROSS/BLUE SHIELD | Admitting: Advanced Practice Midwife

## 2018-07-17 ENCOUNTER — Encounter: Payer: Self-pay | Admitting: Advanced Practice Midwife

## 2018-07-17 VITALS — BP 116/82 | Wt 199.0 lb

## 2018-07-17 DIAGNOSIS — Z3A37 37 weeks gestation of pregnancy: Secondary | ICD-10-CM

## 2018-07-17 DIAGNOSIS — O24410 Gestational diabetes mellitus in pregnancy, diet controlled: Secondary | ICD-10-CM

## 2018-07-17 NOTE — Progress Notes (Signed)
Routine Prenatal Care Visit  Subjective  Brianna Marshall is a 27 y.o. G1P0 at [redacted]w[redacted]d being seen today for ongoing prenatal care.  She is currently monitored for the following issues for this high-risk pregnancy and has Headache, migraine; PCOS (polycystic ovarian syndrome); Prediabetes; Vitamin D deficiency; Infertility, anovulation; Supervision of high risk pregnancy, antepartum; Insulin resistance; Depression affecting pregnancy; Nausea/vomiting in pregnancy; Anemia in pregnancy; Hyperemesis; and Gestational diabetes on their problem list.  ----------------------------------------------------------------------------------- Patient reports continued right upper quadrant pain. She has recently noticed itching on her palms and soles especially at night. She denies headache or change of vision. Blood sugar is primarily within normal range. 2/7 elevated fasting (96 and 105). 3/21 after meals elevated in 130s. .   Contractions: Irregular. Vag. Bleeding: None.  Movement: Present. Denies leaking of fluid.  ----------------------------------------------------------------------------------- The following portions of the patient's history were reviewed and updated as appropriate: allergies, current medications, past family history, past medical history, past social history, past surgical history and problem list. Problem list updated.   Objective  Blood pressure 116/82, weight 199 lb (90.3 kg), last menstrual period 10/28/2017, unknown if currently breastfeeding. Pregravid weight 196 lb (88.9 kg) Total Weight Gain 3 lb (1.361 kg) Urinalysis: Urine Protein: Trace Urine Glucose: Negative  Fetal Status: Fetal Heart Rate (bpm): 145 Fundal Height: 38 cm Movement: Present     General:  Alert, oriented and cooperative. Patient is in no acute distress.  Skin: Skin is warm and dry. No rash noted.   Cardiovascular: Normal heart rate noted  Respiratory: Normal respiratory effort, no problems with respiration  noted  Abdomen: Soft, gravid, appropriate for gestational age. Pain/Pressure: Absent     Pelvic:  Cervical exam deferred        Extremities: Normal range of motion.  Edema: None  Mental Status: Normal mood and affect. Normal behavior. Normal judgment and thought content.   Assessment   27 y.o. G1P0 at [redacted]w[redacted]d by  08/04/2018, by Last Menstrual Period presenting for routine prenatal visit  Plan   pregnancy Problems (from 01/02/18 to present)    Problem Noted Resolved   Gestational diabetes 06/11/2018 by Brianna Milch, MD No   Hyperemesis 06/05/2018 by Brianna Milch, MD No   Nausea/vomiting in pregnancy 01/17/2018 by Brianna Novak, MD No   Supervision of high risk pregnancy, antepartum 01/02/2018 by Brianna Novak, MD No   Overview Addendum 07/16/2018  8:28 AM by Brianna Austria, MD    [facility] Prenatal Labs  Dating Early [place] Blood type: O/Positive/-- (01/29 1038)   Genetic Screen 1 Screen: Neg Antibody:Negative (01/29 1038)  Anatomic [place]  Rubella: <0.90 (01/29 1038) Varicella: Imm  GTT Early: 123 Third trimester:  RPR: Non Reactive (01/29 1038)   Rhogam n/a HBsAg: Negative (01/29 1038)   TDaP vaccine            Flu Shot: 09/2017 HIV: Non Reactive (01/29 1038)   Baby Food                                [id number]  Contraception  Pap:12/2017 normal  CBB     CS/VBAC n/a   Support Person            Insulin resistance 01/02/2018 by Brianna Novak, MD No   Depression affecting pregnancy 01/02/2018 by Brianna Novak, MD No   Overview Signed 01/02/2018  2:41 PM by Brianna Novak, MD    -  initial EPDS 21, patient wants to monitor during first trimester [ ]  consider starting zoloft second trimester.       Low-lying placenta 05/10/2018 by Brianna Marshall, Brianna R, MD 05/30/2018 by Brianna Marshall, Brianna R, MD   Overview Signed 05/10/2018 10:18 AM by Brianna Marshall, Brianna R, MD     Follow up at [redacted] weeks gestation         Discussed plan of care with Brianna Marshall and then with patient. Will not add anti-diabetic agent at this time. Will wait for results of Liver Function test and then check Bile Acid as needed. Will determine IOL date following labs.   Term labor symptoms and general obstetric precautions including but not limited to vaginal bleeding, contractions, leaking of fluid and fetal movement were reviewed in detail with the patient. Please refer to After Visit Summary for other counseling recommendations.   Return in about 1 week (around 07/24/2018) for rob.  Tresea MallJane Jamorris Marshall, CNM 07/17/2018 1:38 PM

## 2018-07-17 NOTE — Patient Instructions (Signed)
Vaginal Delivery Vaginal delivery means that you will give birth by pushing your baby out of your birth canal (vagina). A team of health care providers will help you before, during, and after vaginal delivery. Birth experiences are unique for every woman and every pregnancy, and birth experiences vary depending on where you choose to give birth. What should I do to prepare for my baby's birth? Before your baby is born, it is important to talk with your health care provider about:  Your labor and delivery preferences. These may include: ? Medicines that you may be given. ? How you will manage your pain. This might include non-medical pain relief techniques or injectable pain relief such as epidural analgesia. ? How you and your baby will be monitored during labor and delivery. ? Who may be in the labor and delivery room with you. ? Your feelings about surgical delivery of your baby (cesarean delivery, or C-section) if this becomes necessary. ? Your feelings about receiving donated blood through an IV tube (blood transfusion) if this becomes necessary.  Whether you are able: ? To take pictures or videos of the birth. ? To eat during labor and delivery. ? To move around, walk, or change positions during labor and delivery.  What to expect after your baby is born, such as: ? Whether delayed umbilical cord clamping and cutting is offered. ? Who will care for your baby right after birth. ? Medicines or tests that may be recommended for your baby. ? Whether breastfeeding is supported in your hospital or birth center. ? How long you will be in the hospital or birth center.  How any medical conditions you have may affect your baby or your labor and delivery experience.  To prepare for your baby's birth, you should also:  Attend all of your health care visits before delivery (prenatal visits) as recommended by your health care provider. This is important.  Prepare your home for your baby's  arrival. Make sure that you have: ? Diapers. ? Baby clothing. ? Feeding equipment. ? Safe sleeping arrangements for you and your baby.  Install a car seat in your vehicle. Have your car seat checked by a certified car seat installer to make sure that it is installed safely.  Think about who will help you with your new baby at home for at least the first several weeks after delivery.  What can I expect when I arrive at the birth center or hospital? Once you are in labor and have been admitted into the hospital or birth center, your health care provider may:  Review your pregnancy history and any concerns you have.  Insert an IV tube into one of your veins. This is used to give you fluids and medicines.  Check your blood pressure, pulse, temperature, and heart rate (vital signs).  Check whether your bag of water (amniotic sac) has broken (ruptured).  Talk with you about your birth plan and discuss pain control options.  Monitoring Your health care provider may monitor your contractions (uterine monitoring) and your baby's heart rate (fetal monitoring). You may need to be monitored:  Often, but not continuously (intermittently).  All the time or for long periods at a time (continuously). Continuous monitoring may be needed if: ? You are taking certain medicines, such as medicine to relieve pain or make your contractions stronger. ? You have pregnancy or labor complications.  Monitoring may be done by:  Placing a special stethoscope or a handheld monitoring device on your abdomen to   check your baby's heartbeat, and feeling your abdomen for contractions. This method of monitoring does not continuously record your baby's heartbeat or your contractions.  Placing monitors on your abdomen (external monitors) to record your baby's heartbeat and the frequency and length of contractions. You may not have to wear external monitors all the time.  Placing monitors inside of your uterus  (internal monitors) to record your baby's heartbeat and the frequency, length, and strength of your contractions. ? Your health care provider may use internal monitors if he or she needs more information about the strength of your contractions or your baby's heart rate. ? Internal monitors are put in place by passing a thin, flexible wire through your vagina and into your uterus. Depending on the type of monitor, it may remain in your uterus or on your baby's head until birth. ? Your health care provider will discuss the benefits and risks of internal monitoring with you and will ask for your permission before inserting the monitors.  Telemetry. This is a type of continuous monitoring that can be done with external or internal monitors. Instead of having to stay in bed, you are able to move around during telemetry. Ask your health care provider if telemetry is an option for you.  Physical exam Your health care provider may perform a physical exam. This may include:  Checking whether your baby is positioned: ? With the head toward your vagina (head-down). This is most common. ? With the head toward the top of your uterus (head-up or breech). If your baby is in a breech position, your health care provider may try to turn your baby to a head-down position so you can deliver vaginally. If it does not seem that your baby can be born vaginally, your provider may recommend surgery to deliver your baby. In rare cases, you may be able to deliver vaginally if your baby is head-up (breech delivery). ? Lying sideways (transverse). Babies that are lying sideways cannot be delivered vaginally.  Checking your cervix to determine: ? Whether it is thinning out (effacing). ? Whether it is opening up (dilating). ? How low your baby has moved into your birth canal.  What are the three stages of labor and delivery?  Normal labor and delivery is divided into the following three stages: Stage 1  Stage 1 is the  longest stage of labor, and it can last for hours or days. Stage 1 includes: ? Early labor. This is when contractions may be irregular, or regular and mild. Generally, early labor contractions are more than 10 minutes apart. ? Active labor. This is when contractions get longer, more regular, more frequent, and more intense. ? The transition phase. This is when contractions happen very close together, are very intense, and may last longer than during any other part of labor.  Contractions generally feel mild, infrequent, and irregular at first. They get stronger, more frequent (about every 2-3 minutes), and more regular as you progress from early labor through active labor and transition.  Many women progress through stage 1 naturally, but you may need help to continue making progress. If this happens, your health care provider may talk with you about: ? Rupturing your amniotic sac if it has not ruptured yet. ? Giving you medicine to help make your contractions stronger and more frequent.  Stage 1 ends when your cervix is completely dilated to 4 inches (10 cm) and completely effaced. This happens at the end of the transition phase. Stage 2  Once   your cervix is completely effaced and dilated to 4 inches (10 cm), you may start to feel an urge to push. It is common for the body to naturally take a rest before feeling the urge to push, especially if you received an epidural or certain other pain medicines. This rest period may last for up to 1-2 hours, depending on your unique labor experience.  During stage 2, contractions are generally less painful, because pushing helps relieve contraction pain. Instead of contraction pain, you may feel stretching and burning pain, especially when the widest part of your baby's head passes through the vaginal opening (crowning).  Your health care provider will closely monitor your pushing progress and your baby's progress through the vagina during stage 2.  Your  health care provider may massage the area of skin between your vaginal opening and anus (perineum) or apply warm compresses to your perineum. This helps it stretch as the baby's head starts to crown, which can help prevent perineal tearing. ? In some cases, an incision may be made in your perineum (episiotomy) to allow the baby to pass through the vaginal opening. An episiotomy helps to make the opening of the vagina larger to allow more room for the baby to fit through.  It is very important to breathe and focus so your health care provider can control the delivery of your baby's head. Your health care provider may have you decrease the intensity of your pushing, to help prevent perineal tearing.  After delivery of your baby's head, the shoulders and the rest of the body generally deliver very quickly and without difficulty.  Once your baby is delivered, the umbilical cord may be cut right away, or this may be delayed for 1-2 minutes, depending on your baby's health. This may vary among health care providers, hospitals, and birth centers.  If you and your baby are healthy enough, your baby may be placed on your chest or abdomen to help maintain the baby's temperature and to help you bond with each other. Some mothers and babies start breastfeeding at this time. Your health care team will dry your baby and help keep your baby warm during this time.  Your baby may need immediate care if he or she: ? Showed signs of distress during labor. ? Has a medical condition. ? Was born too early (prematurely). ? Had a bowel movement before birth (meconium). ? Shows signs of difficulty transitioning from being inside the uterus to being outside of the uterus. If you are planning to breastfeed, your health care team will help you begin a feeding. Stage 3  The third stage of labor starts immediately after the birth of your baby and ends after you deliver the placenta. The placenta is an organ that develops  during pregnancy to provide oxygen and nutrients to your baby in the womb.  Delivering the placenta may require some pushing, and you may have mild contractions. Breastfeeding can stimulate contractions to help you deliver the placenta.  After the placenta is delivered, your uterus should tighten (contract) and become firm. This helps to stop bleeding in your uterus. To help your uterus contract and to control bleeding, your health care provider may: ? Give you medicine by injection, through an IV tube, by mouth, or through your rectum (rectally). ? Massage your abdomen or perform a vaginal exam to remove any blood clots that are left in your uterus. ? Empty your bladder by placing a thin, flexible tube (catheter) into your bladder. ? Encourage   you to breastfeed your baby. After labor is over, you and your baby will be monitored closely to ensure that you are both healthy until you are ready to go home. Your health care team will teach you how to care for yourself and your baby. This information is not intended to replace advice given to you by your health care provider. Make sure you discuss any questions you have with your health care provider. Document Released: 09/13/2008 Document Revised: 06/24/2016 Document Reviewed: 12/20/2015 Elsevier Interactive Patient Education  2018 [employer]  

## 2018-07-18 LAB — HEPATIC FUNCTION PANEL
ALBUMIN: 3.5 g/dL (ref 3.5–5.5)
ALT: 8 IU/L (ref 0–32)
AST: 15 IU/L (ref 0–40)
Alkaline Phosphatase: 111 IU/L (ref 39–117)
BILIRUBIN TOTAL: 0.2 mg/dL (ref 0.0–1.2)
Bilirubin, Direct: 0.1 mg/dL (ref 0.00–0.40)
Total Protein: 6 g/dL (ref 6.0–8.5)

## 2018-07-24 ENCOUNTER — Ambulatory Visit (INDEPENDENT_AMBULATORY_CARE_PROVIDER_SITE_OTHER): Payer: BLUE CROSS/BLUE SHIELD | Admitting: Certified Nurse Midwife

## 2018-07-24 VITALS — BP 120/80 | Wt 201.0 lb

## 2018-07-24 DIAGNOSIS — O24410 Gestational diabetes mellitus in pregnancy, diet controlled: Secondary | ICD-10-CM

## 2018-07-24 DIAGNOSIS — Z3A38 38 weeks gestation of pregnancy: Secondary | ICD-10-CM

## 2018-07-24 DIAGNOSIS — O0990 Supervision of high risk pregnancy, unspecified, unspecified trimester: Secondary | ICD-10-CM

## 2018-07-24 NOTE — Progress Notes (Signed)
ROB at 38wk3d; GDM on diet. DId not bring BS log. Reports that usually 3 of 7 fasting CBGs are >95, range from 90-101, 2 hr pp for bfst and lunch are usually <120, and 2 hr pp after supper is around 120.  Four weeks ago EFW 73.1% (AC 96.7%) 6#4oz. TWG 5# this pregnancy Good FM. Occasional BH contraction. Right rib cage pain persists but is no longer escalating. Normal LFTs FH 42 cm. Vertex on US Cervix: 1+/75%// -3 and ballotable, head not well applied. Patient asking about IOL at 39 weeks.  Brianna Marshall, CNM

## 2018-07-27 NOTE — Progress Notes (Signed)
IOL approved by Dr Bonney AidStaebler. Scheduled for induction on 07/29/2018 at 0800 Discussed risks of induction including Cesarean for failed induction, FTP, or FITL, DIscussed risks of shoulder dystocia with vaginal delivery, risks of lacerations and bleeding from atonic uterus.  Patient verbalizes understanding and wishes to proceed. Farrel Connersolleen Patra Gherardi, CNM

## 2018-07-27 NOTE — H&P (Signed)
OB History & Physical   History of Present Illness:  Chief Complaint:  Presenting for induction of labor HPI:  ADEENA BERNABE is a 27 y.o. G1P0 female with EDC=08/04/2018 at [redacted]w[redacted]d dated by her LMP and confirmed with early ultrasound.  Patient achieved pregnancy with some injectable hormones through an REI.  Her pregnancy has been complicated by persistent nausea and vomiting of pregnancy, GERD (takes Nexium), depression ( began Zoloft 50 mgm daily at [redacted] weeks gestation ), and gestational diabetes which has been  fairly well controlled on diet.  She continues to take multiple antiemetics: Zofran prn, two Benadryl with vitamin B6 at hs, and phenergan 25 mgm at 6Am and 6PM.  She has gained only 5# with the pregnancy. Her most recent ultrasound 4 weeks ago estimated the fetal weight at 6#4oz (73.1%) and the [facility] was in the 96.7%. Blood sugar elevations on her diet have not required insulin or oral agents. Another problem she has experienced is pain in her lower right ribcage/ chest wall. This started at 32 weeks. She had normal liver function tests, a normal abdominal ultrasound. The pain has lessened in the last week. She presents to L&D for induction of labor    Prenatal care site: Prenatal care at [facility] has also been remarkable for   [facility] Prenatal Labs  Dating Early [place] Blood type: O/Positive/-- (01/29 1038)   Genetic Screen 1 Screen: Neg Antibody:Negative (01/29 1038)  Anatomic [place] Normal, anterior placenta,  Rubella: <0.90 (01/29 1038) Varicella: Imm  GTT Early: 123 Third trimester: 162 3 hour GTT: 82, 157, 167, 146 RPR: Non Reactive (01/29 1038)   Rhogam n/a HBsAg: Negative (01/29 1038)   TDaP vaccine        05/30/2018    Flu Shot: 09/2017 HIV: Non Reactive (01/29 1038)   Baby Food     Breast                           [id number]  Contraception      Pills Pap:12/2017 normal  CBB     CS/VBAC n/a   Support Person          Maternal Medical History:   Past  Medical History:  Diagnosis Date  . Asthma   . Depression   . Gestational diabetes   . Headache, migraine 07/14/2015  . Infertility, anovulation 03/10/2017  . Obesity   . PCOS (polycystic ovarian syndrome) 07/08/2016  . Prediabetes 07/08/2016   Hgb 5.8  04/2015   . Vitamin D deficiency 07/08/2016   18.7 in 04/2015     Past Surgical History:  Procedure Laterality Date  . INCISE AND DRAIN ABCESS Right 2011   Posterior Thigh  . sebaceous cyst removal  07/2017   Back of neck    Allergies  Allergen Reactions  . Shellfish Allergy Nausea And Vomiting  . Amoxicillin Rash    Prior to Admission medications   Medication Sig Start Date End Date Taking? Authorizing Provider  ACCU-CHEK FASTCLIX LANCETS MISC 1 each by Percutaneous route 4 (four) times daily. 06/05/18   Schuman, Jaquelyn Bitter, MD  albuterol (PROVENTIL HFA;VENTOLIN HFA) 108 (90 Base) MCG/ACT inhaler Inhale 2 puffs into the lungs every 6 (six) hours as needed for wheezing or shortness of breath.    [provider]  diphenhydrAMINE (BENADRYL) 25 MG tablet Take 50 mg by mouth daily.    [provider]  ferrous sulfate (FERROUSUL) 325 (65 FE) MG tablet Take  1 tablet (325 mg total) by mouth 2 (two) times daily. 05/30/18   Schuman, Denman Georgehristanna R, MD  glucose blood (ACCU-CHEK GUIDE) test strip Use as instructed 06/05/18   Schuman, Christanna R, MD         Loratadine (CLARITIN PO) Take 1 tablet by mouth daily.    [provider]  ondansetron (ZOFRAN ODT) 8 MG disintegrating tablet Take 1 tablet (8 mg total) by mouth every 8 (eight) hours as needed for nausea or vomiting. 07/05/18   Conard NovakJackson, Stephen D, MD  Prenatal Multivit-Min-Fe-FA (PRENATAL VITAMINS PO) Take 1 tablet by mouth daily.     [provider]  promethazine (PHENERGAN) 25 MG tablet Take 1 tablet (25 mg total) by mouth every 6 (six) hours. 06/05/18   Schuman, Jaquelyn Bitterhristanna R, MD  Pyridoxine HCl (VITAMIN B6 PO) Take 1 tablet by mouth daily.    [provider]  sertraline (ZOLOFT) 50 MG tablet Take 1 tablet (50 mg total) by mouth daily. 02/13/18   Nadara MustardHarris, Robert P, MD       Social History: She  reports that she has never smoked. She has never used smokeless tobacco. She reports that she drank alcohol rarely prior to the pregnancy. She reports that she does not use drugs.  Family History: family history includes Alcohol abuse in her father and maternal aunt; Bipolar disorder in her maternal aunt; Depression in her paternal grandmother; Diabetes in her maternal aunt, maternal grandmother, and maternal uncle; Drug abuse in her father; Osteoporosis in her mother.   Review of Systems: Negative x 10 systems reviewed except as noted in the HPI.      Physical Exam:  Vital Signs: BP 120/80   Wt 201 lb (91.2 kg)   LMP 10/28/2017   BMI 30.56 kg/m  General: no acute distress.  HEENT: normocephalic, atraumatic Heart: regular rate & rhythm.  No murmurs Lungs: clear to auscultation bilaterally Abdomen: soft, gravid, non-tender; FH 42.   EFW: 8 1/2-9#  [id number] Pelvic:   External: Normal external female genitalia  Cervix: 1+/70%/-3 and ballotable  Extremities: non-tender, symmetric, trace edema bilaterally.    Neurologic: Alert & oriented x 3.     Assessment:  Robyne AskewChristine S Corbridge is a 27 y.o. G1P0 female with gestational diabetes being admitted for elective induction of labor. GBS negative Bishop score: 6  Plan:  1. Admit to Labor & Delivery 2. CBC, T&S, Clrs, IVF, FSBS on admission and every 2-4 hours in labor. 3. GBS negative.   4. O POS/RNI/ VI-vaccinate postpartum 5. Breast 6. TDAP UTD 7. DIscussed induction process and methods that are used to induce including Cytotec, foley bulb, and Pitocin. Aware of risks of hyperstimulation and Cesarean section due to failure to progress, failed induction, and fetal intolerance. She is also aware of risks of shoulder dystocia, uterine atony and PPH with large babies or babies of  GDMs.  Farrel ConnersColleen Sharlyne Koeneman  07/27/2018 2:11 PM

## 2018-07-29 ENCOUNTER — Other Ambulatory Visit: Payer: Self-pay

## 2018-07-29 ENCOUNTER — Inpatient Hospital Stay: Payer: BLUE CROSS/BLUE SHIELD | Admitting: Anesthesiology

## 2018-07-29 ENCOUNTER — Encounter: Payer: Self-pay | Admitting: *Deleted

## 2018-07-29 ENCOUNTER — Inpatient Hospital Stay
Admission: EM | Admit: 2018-07-29 | Discharge: 2018-07-31 | DRG: 768 | Disposition: A | Discharge status: Home / Self Care | Payer: BLUE CROSS/BLUE SHIELD | Attending: Obstetrics and Gynecology | Admitting: Obstetrics and Gynecology

## 2018-07-29 DIAGNOSIS — Z3A39 39 weeks gestation of pregnancy: Secondary | ICD-10-CM | POA: Diagnosis not present

## 2018-07-29 DIAGNOSIS — R1110 Vomiting, unspecified: Secondary | ICD-10-CM

## 2018-07-29 DIAGNOSIS — O24420 Gestational diabetes mellitus in childbirth, diet controlled: Principal | ICD-10-CM | POA: Diagnosis present

## 2018-07-29 DIAGNOSIS — O99340 Other mental disorders complicating pregnancy, unspecified trimester: Secondary | ICD-10-CM

## 2018-07-29 DIAGNOSIS — O1404 Mild to moderate pre-eclampsia, complicating childbirth: Secondary | ICD-10-CM | POA: Diagnosis present

## 2018-07-29 DIAGNOSIS — O24419 Gestational diabetes mellitus in pregnancy, unspecified control: Secondary | ICD-10-CM | POA: Diagnosis present

## 2018-07-29 DIAGNOSIS — E8881 Metabolic syndrome: Secondary | ICD-10-CM

## 2018-07-29 DIAGNOSIS — O0990 Supervision of high risk pregnancy, unspecified, unspecified trimester: Secondary | ICD-10-CM

## 2018-07-29 DIAGNOSIS — O219 Vomiting of pregnancy, unspecified: Secondary | ICD-10-CM

## 2018-07-29 DIAGNOSIS — O24410 Gestational diabetes mellitus in pregnancy, diet controlled: Secondary | ICD-10-CM

## 2018-07-29 DIAGNOSIS — O99019 Anemia complicating pregnancy, unspecified trimester: Secondary | ICD-10-CM | POA: Diagnosis present

## 2018-07-29 DIAGNOSIS — O1403 Mild to moderate pre-eclampsia, third trimester: Secondary | ICD-10-CM | POA: Diagnosis present

## 2018-07-29 DIAGNOSIS — O1414 Severe pre-eclampsia complicating childbirth: Secondary | ICD-10-CM | POA: Diagnosis present

## 2018-07-29 DIAGNOSIS — O1490 Unspecified pre-eclampsia, unspecified trimester: Secondary | ICD-10-CM | POA: Diagnosis present

## 2018-07-29 DIAGNOSIS — F329 Major depressive disorder, single episode, unspecified: Secondary | ICD-10-CM

## 2018-07-29 HISTORY — DX: Gestational diabetes mellitus in pregnancy, unspecified control: O24.419

## 2018-07-29 LAB — COMPREHENSIVE METABOLIC PANEL
ALBUMIN: 2.8 g/dL — AB (ref 3.5–5.0)
ALK PHOS: 98 U/L (ref 38–126)
ALT: 10 U/L (ref 0–44)
ANION GAP: 6 (ref 5–15)
AST: 19 U/L (ref 15–41)
BILIRUBIN TOTAL: 0.2 mg/dL — AB (ref 0.3–1.2)
BUN: 14 mg/dL (ref 6–20)
CALCIUM: 8.5 mg/dL — AB (ref 8.9–10.3)
CO2: 19 mmol/L — AB (ref 22–32)
Chloride: 109 mmol/L (ref 98–111)
Creatinine, Ser: 0.71 mg/dL (ref 0.44–1.00)
GFR calc Af Amer: >60 mL/min (ref >60)
GFR calc non Af Amer: >60 mL/min (ref >60)
GLUCOSE: 78 mg/dL (ref 70–99)
Potassium: 4.3 mmol/L (ref 3.5–5.1)
SODIUM: 134 mmol/L — AB (ref 135–145)
Total Protein: 6.2 g/dL — ABNORMAL LOW (ref 6.5–8.1)

## 2018-07-29 LAB — CBC
HEMATOCRIT: 37.9 % (ref 35.0–47.0)
HEMOGLOBIN: 12.8 g/dL (ref 12.0–16.0)
MCH: 29.5 pg (ref 26.0–34.0)
MCHC: 33.8 g/dL (ref 32.0–36.0)
MCV: 87.3 fL (ref 80.0–100.0)
Platelets: 296 10*3/uL (ref 150–440)
RBC: 4.34 MIL/uL (ref 3.80–5.20)
RDW: 15.3 % — ABNORMAL HIGH (ref 11.5–14.5)
WBC: 10.3 10*3/uL (ref 3.6–11.0)

## 2018-07-29 LAB — GLUCOSE, CAPILLARY
GLUCOSE-CAPILLARY: 73 mg/dL (ref 70–99)
Glucose-Capillary: 74 mg/dL (ref 70–99)
Glucose-Capillary: 89 mg/dL (ref 70–99)
Glucose-Capillary: 71 mg/dL (ref 70–99)

## 2018-07-29 LAB — TYPE AND SCREEN
ABO/RH(D): O POS
ANTIBODY SCREEN: NEGATIVE

## 2018-07-29 LAB — PROTEIN / CREATININE RATIO, URINE
Creatinine, Urine: 119 mg/dL
PROTEIN CREATININE RATIO: 0.48 mg/mg{creat} — AB (ref 0.00–0.15)
Total Protein, Urine: 57 mg/dL

## 2018-07-29 MED ORDER — LACTATED RINGERS IV SOLN: 500.0000 mL | INTRAVENOUS | Status: DC | PRN

## 2018-07-29 MED ORDER — EPHEDRINE 5 MG/ML INJ
10.0000 mg | INTRAVENOUS | Status: DC | PRN
  Filled 2018-07-29: qty 2

## 2018-07-29 MED ORDER — OXYTOCIN 40 UNITS IN LACTATED RINGERS INFUSION - SIMPLE MED
1.0000 m[IU]/min | INTRAVENOUS | Status: DC
  Administered 2018-07-29: 1 m[IU]/min via INTRAVENOUS
  Filled 2018-07-29: qty 1000

## 2018-07-29 MED ORDER — MISOPROSTOL 200 MCG PO TABS
ORAL_TABLET | ORAL | Status: AC
  Filled 2018-07-29: qty 4

## 2018-07-29 MED ORDER — FENTANYL 2.5 MCG/ML W/ROPIVACAINE 0.15% IN NS 100 ML EPIDURAL (ARMC)
EPIDURAL | Status: AC
  Filled 2018-07-29: qty 100

## 2018-07-29 MED ORDER — DIPHENHYDRAMINE HCL 50 MG/ML IJ SOLN: 12.5000 mg | INTRAMUSCULAR | Status: DC | PRN

## 2018-07-29 MED ORDER — LACTATED RINGERS IV SOLN: 500.0000 mL | Freq: Once | INTRAVENOUS | Status: DC

## 2018-07-29 MED ORDER — AMMONIA AROMATIC IN INHA
RESPIRATORY_TRACT | Status: AC
  Filled 2018-07-29: qty 10

## 2018-07-29 MED ORDER — FENTANYL CITRATE (PF) 100 MCG/2ML IJ SOLN
50.0000 ug | Freq: Once | INTRAMUSCULAR | Status: AC
  Administered 2018-07-29: 50 ug via INTRAVENOUS
  Filled 2018-07-29: qty 2

## 2018-07-29 MED ORDER — FENTANYL 2.5 MCG/ML W/ROPIVACAINE 0.15% IN NS 100 ML EPIDURAL (ARMC)
12.0000 mL/h | EPIDURAL | Status: DC
  Administered 2018-07-29 – 2018-07-30 (×2): 12 mL/h via EPIDURAL
  Filled 2018-07-29: qty 100

## 2018-07-29 MED ORDER — LIDOCAINE HCL (PF) 1 % IJ SOLN
INTRAMUSCULAR | Status: AC
  Filled 2018-07-29: qty 30

## 2018-07-29 MED ORDER — MISOPROSTOL 25 MCG QUARTER TABLET
ORAL_TABLET | ORAL | Status: AC
  Administered 2018-07-29: 25 ug via VAGINAL
  Filled 2018-07-29: qty 1

## 2018-07-29 MED ORDER — TERBUTALINE SULFATE 1 MG/ML IJ SOLN: 0.2500 mg | Freq: Once | INTRAMUSCULAR | Status: DC | PRN

## 2018-07-29 MED ORDER — ONDANSETRON HCL 4 MG/2ML IJ SOLN
4.0000 mg | Freq: Four times a day (QID) | INTRAMUSCULAR | Status: DC | PRN
  Administered 2018-07-30: 4 mg via INTRAVENOUS
  Filled 2018-07-29: qty 2

## 2018-07-29 MED ORDER — OXYTOCIN BOLUS FROM INFUSION
500.0000 mL | Freq: Once | INTRAVENOUS | Status: AC
  Administered 2018-07-30: 500 mL via INTRAVENOUS

## 2018-07-29 MED ORDER — SODIUM CHLORIDE 0.9 % IJ SOLN
INTRAMUSCULAR | Status: AC
  Filled 2018-07-29: qty 50

## 2018-07-29 MED ORDER — PHENYLEPHRINE 40 MCG/ML (10ML) SYRINGE FOR IV PUSH (FOR BLOOD PRESSURE SUPPORT)
80.0000 ug | PREFILLED_SYRINGE | INTRAVENOUS | Status: DC | PRN
  Filled 2018-07-29: qty 5

## 2018-07-29 MED ORDER — LACTATED RINGERS IV SOLN
INTRAVENOUS | Status: DC
  Administered 2018-07-29 (×3): via INTRAVENOUS

## 2018-07-29 MED ORDER — OXYTOCIN 10 UNIT/ML IJ SOLN
INTRAMUSCULAR | Status: AC
  Filled 2018-07-29: qty 2

## 2018-07-29 MED ORDER — OXYTOCIN 10 UNIT/ML IJ SOLN: 10.0000 [IU] | Freq: Once | INTRAMUSCULAR | Status: DC

## 2018-07-29 MED ORDER — MISOPROSTOL 25 MCG QUARTER TABLET
25.0000 ug | ORAL_TABLET | ORAL | Status: DC | PRN
  Administered 2018-07-29: 25 ug via VAGINAL

## 2018-07-29 MED ORDER — SOD CITRATE-CITRIC ACID 500-334 MG/5ML PO SOLN: 30.0000 mL | ORAL | Status: DC | PRN

## 2018-07-29 MED ORDER — OXYTOCIN 40 UNITS IN LACTATED RINGERS INFUSION - SIMPLE MED: 2.5000 [IU]/h | INTRAVENOUS | Status: DC

## 2018-07-29 MED ORDER — BUPIVACAINE HCL (PF) 0.25 % IJ SOLN
INTRAMUSCULAR | Status: DC | PRN
  Administered 2018-07-29: 5 mL via EPIDURAL

## 2018-07-29 NOTE — Anesthesia Procedure Notes (Signed)
Epidural Patient location during procedure: OB  Staffing Anesthesiologist: Leeasia Secrist, MD Performed: anesthesiologist   Preanesthetic Checklist Completed: patient identified, site marked, surgical consent, pre-op evaluation, timeout performed, IV checked, risks and benefits discussed and monitors and equipment checked  Epidural Patient position: sitting Prep: ChloraPrep Patient monitoring: heart rate, continuous pulse ox and blood pressure Approach: midline Location: L4-L5 Injection technique: LOR saline  Needle:  Needle type: Tuohy  Needle gauge: 18 G Needle length: 9 cm and 9 Catheter type: closed end flexible Catheter size: 20 Guage Test dose: negative and 1.5% lidocaine with Epi 1:200 K  Assessment Sensory level: T10 Events: blood not aspirated, injection not painful, no injection resistance, negative IV test and no paresthesia  Additional Notes   Patient tolerated the insertion well without complications.Reason for block:procedure for pain     

## 2018-07-29 NOTE — Anesthesia Preprocedure Evaluation (Signed)
Anesthesia Evaluation  Patient identified by MRN, date of birth, ID band Patient awake    Reviewed: Allergy & Precautions, NPO status , Patient's Chart, lab work & pertinent test results, reviewed documented beta blocker date and time   Airway Mallampati: II  TM Distance: >3 FB     Dental  (+) Chipped   Pulmonary asthma ,           Cardiovascular      Neuro/Psych  Headaches, PSYCHIATRIC DISORDERS Depression    GI/Hepatic   Endo/Other  diabetes, Type 2  Renal/GU      Musculoskeletal   Abdominal   Peds  Hematology  (+) anemia ,   Anesthesia Other Findings   Reproductive/Obstetrics                             Anesthesia Physical Anesthesia Plan  ASA: III  Anesthesia Plan: Epidural   Post-op Pain Management:    Induction:   PONV Risk Score and Plan:   Airway Management Planned:   Additional Equipment:   Intra-op Plan:   Post-operative Plan:   Informed Consent: I have reviewed the patients History and Physical, chart, labs and discussed the procedure including the risks, benefits and alternatives for the proposed anesthesia with the patient or authorized representative who has indicated his/her understanding and acceptance.     Plan Discussed with: CRNA  Anesthesia Plan Comments:         Anesthesia Quick Evaluation

## 2018-07-29 NOTE — Progress Notes (Signed)
Labor Check  Subj:  Complaints: comfortable with epidural   Obj:  BP 130/88   Pulse 83   Temp 98 F (36.7 C) (Oral)   Resp 16   Ht 5\' 8"  (1.727 m)   Wt 90.4 kg   LMP 10/28/2017   SpO2 100%   BMI 30.32 kg/m  Dose (milli-units/min) Oxytocin: 4 milli-units/min  Cervix: Dilation: 4 / Effacement (%): 80 / Station: -2   AROM - light meconium Baseline FHR: 125 beats/min   Variability: moderate   Accelerations: present   Decelerations: absent  Scalp stimulation also noted to increase heart rate.  Contractions: present frequency: 5 q 10 min Overall assessment: cat 1  A/P: 27 y.o. G1P0 female at 4140w1d with IOL.  1.  Labor: AROM with light meconium. Discussed implications.  Continue pitocin at current dose.  If no change in 2 hours, will add IUPC 2.  FWB: reassuring, Overall assessment: category cat 1  3.  GBS neg  4.  Pain: epidural 5.  Recheck: 2 hours or PRN   Thomasene MohairStephen Armelia Penton, MD, Merlinda FrederickFACOG [facility], [facility] 07/29/2018 8:44 PM

## 2018-07-29 NOTE — H&P (Signed)
Update to History and Physical written 07/27/18.  Patient presents to labor and delivery for gestational diabetes.  She presents today with no symptoms. Her blood glucose values have been fairly normal.  She has had persistent right upper quadrant pain and nausea this pregnancy.  The nausea has been well-controlled with twice daily phenergan and zofran.  She notes only occasional contractions. She denies vaginal bleeding, and leakage of fluid. She also notes fetal movement.  She denies a headache, visual changes, and right upper quadrant pain at this moment.  Her last growth ultrasound was on 06/26/18.  The growth was 73rd %ile (2,843 grams or 6 lb 4 oz).  Notably, the abdominal circumference was 97th%ile. The AFI was 20.35 cm.    Her total weight gain this pregnancy has been 5 lb.   Objective: BP 128/88   Pulse 79   Resp 14   Ht 5\' 8"  (1.727 m)   Wt 90.4 kg   LMP 10/28/2017   BMI 30.32 kg/m   BPs 135/95, 123/91, 128/88 Physical Exam  Constitutional: She is oriented to person, place, and time. She appears well-developed and well-nourished. No distress.  HENT:  Head: Normocephalic and atraumatic.  Eyes: Conjunctivae are normal. No scleral icterus.  Cardiovascular: Normal rate and regular rhythm.  Pulmonary/Chest: Effort normal and breath sounds normal. No respiratory distress.  Abdominal: Soft. Bowel sounds are normal.  Gravid, NT, EFW 8.8 lb.   Genitourinary: Vagina normal and uterus normal.  Genitourinary Comments: Female chaperone present 1/60/-2/mid/moderate Foley balloon catheter placed with 40 mL sterile saline instilled in balloon.   Misoprostol 25 mcg placed vaginally  Musculoskeletal: Normal range of motion. She exhibits no edema.  Neurological: She is alert and oriented to person, place, and time. No cranial nerve deficit.  Skin: Skin is warm and dry. No erythema.  Psychiatric: She has a normal mood and affect. Her behavior is normal. Judgment normal.    Fetal  tracing: Baseline FHR: 135 beats/min Variability: moderate Accelerations: present Decelerations: absent Tocometry: 3 q 10 min  A/P: 27 y.o. G1P0 female at 3273w1d with elevated blood pressures, gestational diabetes who presents for schedule induction of labor.  Admit patient. Pre-eclampsia labs given elevated BPs. Foley balloon and misoprostol after discussion of various methods of induction.  Monitor BG values Q 4 hours initially. GBS negative on 7/23. FWB: reassuring with category 1 tracing  Thomasene MohairStephen Karsynn Deweese, MD, Merlinda FrederickFACOG [facility], [facility] 07/29/2018 11:07 AM

## 2018-07-30 DIAGNOSIS — O24420 Gestational diabetes mellitus in childbirth, diet controlled: Secondary | ICD-10-CM

## 2018-07-30 DIAGNOSIS — O1403 Mild to moderate pre-eclampsia, third trimester: Secondary | ICD-10-CM | POA: Diagnosis present

## 2018-07-30 DIAGNOSIS — O1414 Severe pre-eclampsia complicating childbirth: Secondary | ICD-10-CM | POA: Diagnosis present

## 2018-07-30 DIAGNOSIS — Z3A39 39 weeks gestation of pregnancy: Secondary | ICD-10-CM

## 2018-07-30 DIAGNOSIS — O1490 Unspecified pre-eclampsia, unspecified trimester: Secondary | ICD-10-CM | POA: Diagnosis present

## 2018-07-30 HISTORY — DX: Mild to moderate pre-eclampsia, third trimester: O14.03

## 2018-07-30 HISTORY — DX: Severe pre-eclampsia complicating childbirth: O14.14

## 2018-07-30 LAB — URINALYSIS, ROUTINE W REFLEX MICROSCOPIC
BACTERIA UA: NONE SEEN
Bilirubin Urine: NEGATIVE
Glucose, UA: NEGATIVE mg/dL
Hgb urine dipstick: NEGATIVE
KETONES UR: NEGATIVE mg/dL
NITRITE: NEGATIVE
PROTEIN: 30 mg/dL — AB
Specific Gravity, Urine: 1.019 (ref 1.005–1.030)
pH: 5 (ref 5.0–8.0)

## 2018-07-30 LAB — GLUCOSE, CAPILLARY: Glucose-Capillary: 131 mg/dL — ABNORMAL HIGH (ref 70–99)

## 2018-07-30 MED ORDER — ACETAMINOPHEN 325 MG PO TABS: 650.0000 mg | ORAL_TABLET | ORAL | Status: DC | PRN

## 2018-07-30 MED ORDER — IBUPROFEN 600 MG PO TABS
ORAL_TABLET | ORAL | Status: AC
  Filled 2018-07-30: qty 1

## 2018-07-30 MED ORDER — COCONUT OIL OIL
1.0000 "application " | TOPICAL_OIL | Status: DC | PRN
  Administered 2018-07-30: 1 via TOPICAL
  Filled 2018-07-30: qty 120

## 2018-07-30 MED ORDER — SERTRALINE HCL 25 MG PO TABS
50.0000 mg | ORAL_TABLET | Freq: Every day | ORAL | Status: DC
  Administered 2018-07-30: 50 mg via ORAL
  Filled 2018-07-30: qty 2

## 2018-07-30 MED ORDER — ONDANSETRON HCL 4 MG PO TABS: 4.0000 mg | ORAL_TABLET | ORAL | Status: DC | PRN

## 2018-07-30 MED ORDER — DIBUCAINE 1 % RE OINT: 1.0000 "application " | TOPICAL_OINTMENT | RECTAL | Status: DC | PRN

## 2018-07-30 MED ORDER — DIPHENHYDRAMINE HCL 25 MG PO CAPS: 25.0000 mg | ORAL_CAPSULE | Freq: Four times a day (QID) | ORAL | Status: DC | PRN

## 2018-07-30 MED ORDER — FAMOTIDINE 20 MG PO TABS
20.0000 mg | ORAL_TABLET | Freq: Two times a day (BID) | ORAL | Status: DC | PRN
Start: 2018-07-30 — End: 2018-07-31

## 2018-07-30 MED ORDER — ONDANSETRON HCL 4 MG/2ML IJ SOLN: 4.0000 mg | INTRAMUSCULAR | Status: DC | PRN

## 2018-07-30 MED ORDER — BENZOCAINE-MENTHOL 20-0.5 % EX AERO
INHALATION_SPRAY | CUTANEOUS | Status: AC
  Filled 2018-07-30: qty 56

## 2018-07-30 MED ORDER — IBUPROFEN 600 MG PO TABS
600.0000 mg | ORAL_TABLET | Freq: Four times a day (QID) | ORAL | Status: DC
  Administered 2018-07-30 – 2018-07-31 (×4): 600 mg via ORAL
  Filled 2018-07-30 (×3): qty 1

## 2018-07-30 MED ORDER — SIMETHICONE 80 MG PO CHEW: 80.0000 mg | CHEWABLE_TABLET | ORAL | Status: DC | PRN

## 2018-07-30 MED ORDER — FERROUS SULFATE 325 (65 FE) MG PO TABS
325.0000 mg | ORAL_TABLET | Freq: Two times a day (BID) | ORAL | Status: DC
  Administered 2018-07-30 – 2018-07-31 (×2): 325 mg via ORAL
  Filled 2018-07-30 (×2): qty 1

## 2018-07-30 MED ORDER — BENZOCAINE-MENTHOL 20-0.5 % EX AERO
1.0000 "application " | INHALATION_SPRAY | CUTANEOUS | Status: DC | PRN
  Administered 2018-07-30 (×2): 1 via TOPICAL
  Filled 2018-07-30: qty 56

## 2018-07-30 MED ORDER — WITCH HAZEL-GLYCERIN EX PADS
1.0000 "application " | MEDICATED_PAD | CUTANEOUS | Status: DC | PRN
  Administered 2018-07-30: 1 via TOPICAL
  Filled 2018-07-30: qty 100

## 2018-07-30 MED ORDER — MEASLES, MUMPS & RUBELLA VAC ~~LOC~~ INJ
0.5000 mL | INJECTION | SUBCUTANEOUS | Status: AC | PRN
  Administered 2018-07-31: 0.5 mL via SUBCUTANEOUS
  Filled 2018-07-30: qty 0.5

## 2018-07-30 MED ORDER — HYDROCODONE-ACETAMINOPHEN 5-325 MG PO TABS: 1.0000 | ORAL_TABLET | ORAL | Status: DC | PRN

## 2018-07-30 MED ORDER — PRENATAL MULTIVITAMIN CH
1.0000 | ORAL_TABLET | Freq: Every day | ORAL | Status: DC
  Administered 2018-07-30 – 2018-07-31 (×2): 1 via ORAL
  Filled 2018-07-30 (×2): qty 1

## 2018-07-30 MED ORDER — SENNOSIDES-DOCUSATE SODIUM 8.6-50 MG PO TABS
2.0000 | ORAL_TABLET | ORAL | Status: DC
  Administered 2018-07-30: 2 via ORAL
  Filled 2018-07-30: qty 2

## 2018-07-30 NOTE — Lactation Note (Signed)
This note was copied from a baby's chart. Lactation Consultation Note  Patient Name: Brianna Marshall'UToday's Date: 07/30/2018 Reason for consult: Follow-up assessment;Primapara;Term;Other (Comment)(Baby has positive coombs)  Mom wanting to be independent with latching the baby.  Demonstrated hand expression.  At first mom had a shallow latch and was experiencing discomfort.  Repositioned with wider mouth and lower lip flipped outward more which felt better.  Good rhythmic sucks noted with occasional swallows.  Mom was tender after coming off the breast this time.  She reports transient nipple tenderness which is pain when first latches and then eases up into feeding.  Demonstrated how to rub colostrum on nipples after each feeding.  Coconut oil and comfort gels given upon her request to alternate use for sore nipples.  Baby has (+) coombs, so watching bilirubins.  Discussed supply and demand, normal course of lactation and routine newborn feeding patterns.  Lactation name and number on white board and encouraged to call with questions, concerns or assistance.   Maternal Data Formula Feeding for Exclusion: No Has patient been taught Hand Expression?: Yes Does the patient have breastfeeding experience prior to this delivery?: No  Feeding Feeding Type: Breast Fed Length of feed: 15 min  LATCH Score Latch: Repeated attempts needed to sustain latch, nipple held in mouth throughout feeding, stimulation needed to elicit sucking reflex.  Audible Swallowing: A few with stimulation  Type of Nipple: Everted at rest and after stimulation  Comfort (Breast/Nipple): Filling, red/small blisters or bruises, mild/mod discomfort  Hold (Positioning): No assistance needed to correctly position infant at breast.  LATCH Score: 7  Interventions Interventions: Breast feeding basics reviewed;Assisted with latch;Breast compression;Coconut oil;Adjust position;Breast massage;Support pillows;Comfort gels;Hand  express;Position options  Lactation Tools Discussed/Used Tools: Coconut oil;Comfort gels WIC Program: No(BCBS)   Consult Status Consult Status: PRN Follow-up type: Call as needed    Brianna Marshall, Brianna Marshall 07/30/2018, 3:50 PM

## 2018-07-30 NOTE — Discharge Summary (Signed)
OB Discharge Summary     Patient Name: Brianna AskewChristine S Marshall DOB: 1991-10-02 MRN: [id number]  Date of admission: 07/29/2018 Delivering MD: Thomasene MohairStephen Jackson, MD  Date of Delivery: 07/30/2018  Date of discharge: 07/31/2018  Admitting diagnosis: 39 weeks Intrauterine pregnancy: 6535w2d     Secondary diagnosis: Preeclampsia and Gestational Diabetes diet controlled (A1)     Discharge diagnosis: Term Pregnancy Delivered, Preeclampsia (mild) and GDM A1                                                                                                Post partum procedures:None  Augmentation: AROM, Pitocin, Cytotec and Foley Balloon  Complications: None  Hospital course:  Induction of Labor With Vaginal Delivery   27 y.o. yo G1P0 at 5135w2d was admitted to the hospital 07/29/2018 for induction of labor.  Indication for induction: Preeclampsia and GDMA1.  Patient had an uncomplicated labor course as follows: Membrane Rupture Time/Date: 8:40 PM ,07/29/2018   Intrapartum Procedures: Episiotomy: None [1]                                         Lacerations:  3rd degree [4];Perineal [11]  Patient had delivery of a Viable infant.  Information for the patient's newborn:  Waynetta PeanSanders, Boy Chaunice [[id number]][[id number]]  Delivery Method: Vag-Spont   07/30/2018  Details of delivery can be found in separate delivery note.  Patient had a routine postpartum course. Patient is discharged home 07/31/18.  Physical exam  Vitals:   07/30/18 1513 07/30/18 2008 07/30/18 2339 07/31/18 0825  BP: 118/79 133/90 119/73 118/87  Pulse: 93 90 82 74  Resp: 18 18 18 18   Temp: 98.6 F (37 C) 98 F (36.7 C) 98 F (36.7 C) 98.7 F (37.1 C)  TempSrc: Oral Oral Oral Oral  SpO2: 99% 100% 100% 99%  Weight:      Height:       General: alert, cooperative and no distress Lochia: appropriate Uterine Fundus: firm Incision: N/A DVT Evaluation: No evidence of DVT seen on physical exam. Negative Homan's sign.  Labs: Lab Results  Component  Value Date   WBC 12.1 (H) 07/31/2018   HGB 9.0 (L) 07/31/2018   HCT 26.4 (L) 07/31/2018   MCV 88.3 07/31/2018   PLT 228 07/31/2018    Discharge instruction: per After Visit Summary.  Medications:  Allergies as of 07/31/2018      Reactions   Shellfish Allergy Nausea And Vomiting   Amoxicillin Rash   Lidoderm [lidocaine] Rash      Medication List    STOP taking these medications   ACCU-CHEK FASTCLIX LANCETS Misc   diphenhydrAMINE 25 MG tablet Commonly known as:  BENADRYL   glucose blood test strip   lidocaine 5 % Commonly known as:  LIDODERM   ondansetron 8 MG disintegrating tablet Commonly known as:  ZOFRAN-ODT   promethazine 25 MG tablet Commonly known as:  PHENERGAN   VITAMIN B6 PO     TAKE these medications   albuterol 108 (90 Base) MCG/ACT inhaler  Commonly known as:  PROVENTIL HFA;VENTOLIN HFA Inhale 2 puffs into the lungs every 6 (six) hours as needed for wheezing or shortness of breath.   CLARITIN PO Take 1 tablet by mouth daily.   ferrous sulfate 325 (65 FE) MG tablet Take 1 tablet (325 mg total) by mouth 2 (two) times daily.   norethindrone 0.35 MG tablet Commonly known as:  MICRONOR,CAMILA,ERRIN Take 1 tablet (0.35 mg total) by mouth daily. Start in 2 weeks   PRENATAL VITAMINS PO Take 1 tablet by mouth daily.   sertraline 50 MG tablet Commonly known as:  ZOLOFT Take 1 tablet (50 mg total) by mouth daily.       Diet: routine diet  Activity: Advance as tolerated. Pelvic rest for 6 weeks.   Outpatient follow up: Follow-up Information    Conard NovakJackson, Stephen D, MD. Schedule an appointment as soon as possible for a visit in 2 week(s).   Specialty:  Obstetrics and Gynecology Why:  postpartum mood check Contact information: [address] [place] [address] [phone]             Postpartum contraception: Progesterone only pills Rhogam Given postpartum: no Rubella vaccine given postpartum: yes Varicella vaccine given  postpartum: no TDaP given antepartum or postpartum: Given AP on 05/30/18  Newborn Data: Live born female  Birth Weight: 9 lb 5.2 oz (4230 g) APGAR: 7, 9  Newborn Delivery   Birth date/time:  07/30/2018 03:49:00 Delivery type:  Vaginal, Spontaneous    Baby Feeding: Breast  Disposition:home with mother  SIGNED: Annamarie MajorPaul Nixon Sparr, MD, Merlinda FrederickFACOG [facility], [facility] 07/31/2018  8:32 AM

## 2018-07-31 ENCOUNTER — Encounter: Payer: Self-pay | Admitting: Internal Medicine

## 2018-07-31 LAB — CBC
HEMATOCRIT: 26.4 % — AB (ref 35.0–47.0)
Hemoglobin: 9 g/dL — ABNORMAL LOW (ref 12.0–16.0)
MCH: 30 pg (ref 26.0–34.0)
MCHC: 34 g/dL (ref 32.0–36.0)
MCV: 88.3 fL (ref 80.0–100.0)
Platelets: 228 10*3/uL (ref 150–440)
RBC: 2.99 MIL/uL — ABNORMAL LOW (ref 3.80–5.20)
RDW: 15.7 % — AB (ref 11.5–14.5)
WBC: 12.1 10*3/uL — ABNORMAL HIGH (ref 3.6–11.0)

## 2018-07-31 LAB — GLUCOSE, CAPILLARY
Glucose-Capillary: 119 mg/dL — ABNORMAL HIGH (ref 70–99)
Glucose-Capillary: 89 mg/dL (ref 70–99)

## 2018-07-31 LAB — RPR: RPR: NONREACTIVE

## 2018-07-31 MED ORDER — NORETHINDRONE 0.35 MG PO TABS: 1.0000 | ORAL_TABLET | Freq: Every day | ORAL | 11 refills | Status: DC

## 2018-07-31 MED ORDER — IBUPROFEN 600 MG PO TABS
600.0000 mg | ORAL_TABLET | Freq: Four times a day (QID) | ORAL | Status: DC
  Administered 2018-07-31: 600 mg via ORAL
  Filled 2018-07-31: qty 1

## 2018-07-31 NOTE — Anesthesia Postprocedure Evaluation (Signed)
Anesthesia Post Note  Patient: Brianna Marshall  Procedure(s) Performed: AN AD HOC LABOR EPIDURAL  Patient location during evaluation: Mother Baby Anesthesia Type: Epidural Level of consciousness: awake and awake and alert Pain management: pain level controlled Vital Signs Assessment: post-procedure vital signs reviewed and stable Respiratory status: spontaneous breathing Cardiovascular status: blood pressure returned to baseline Postop Assessment: no headache, no backache and able to ambulate Anesthetic complications: no     Last Vitals:  Vitals:   07/30/18 2339 07/31/18 0825  BP: 119/73 118/87  Pulse: 82 74  Resp: 18 18  Temp: 36.7 C 37.1 C  SpO2: 100% 99%    Last Pain:  Vitals:   07/31/18 0825  TempSrc: Oral  PainSc:                  Brianna Caldwelleana Cadel Marshall

## 2018-07-31 NOTE — Progress Notes (Signed)
Patient discharged home with infant. Discharge instructions, prescriptions and follow up appointment given to and reviewed with patient. Patient verbalized understanding. Patient wheeled out with infant by NT 

## 2018-07-31 NOTE — Progress Notes (Signed)
Admit Date: 07/29/2018 Today's Date: 07/31/2018  Post Partum Day 1  Subjective:  no complaints, up ad lib, voiding and tolerating PO  Objective: Temp:  [98 F (36.7 C)-98.7 F (37.1 C)] 98.7 F (37.1 C) (08/13 0825) Pulse Rate:  [74-93] 74 (08/13 0825) Resp:  [18-20] 18 (08/13 0825) BP: (111-133)/(65-90) 118/87 (08/13 0825) SpO2:  [99 %-100 %] 99 % (08/13 0825)  Physical Exam:  General: alert, cooperative and no distress Lochia: appropriate Uterine Fundus: firm Incision: none DVT Evaluation: No evidence of DVT seen on physical exam. Negative Homan's sign.  Recent Labs    07/29/18 0945 07/31/18 0558  HGB 12.8 9.0*  HCT 37.9 26.4*    Assessment/Plan: Breastfeeding, Contraception (mini pill) and Infant doing well  Consider D/C today Cont Zoloft as taking prior to delivery Check 2 hour Glucola at 6 weeks   LOS: 2 days   Brianna Marshall [facility] 07/31/2018, 8:33 AM

## 2018-07-31 NOTE — Plan of Care (Signed)
Vs stable; up ad lib; tolerating regular diet; showered this shift; taking motrin for pain control; breastfeeding and does need some assistance

## 2018-08-13 ENCOUNTER — Ambulatory Visit (INDEPENDENT_AMBULATORY_CARE_PROVIDER_SITE_OTHER): Payer: BLUE CROSS/BLUE SHIELD | Admitting: Obstetrics and Gynecology

## 2018-08-13 ENCOUNTER — Encounter: Payer: Self-pay | Admitting: Obstetrics and Gynecology

## 2018-08-13 VITALS — BP 104/70 | HR 67 | Ht 68.0 in | Wt 174.0 lb

## 2018-08-13 DIAGNOSIS — O99345 Other mental disorders complicating the puerperium: Secondary | ICD-10-CM | POA: Diagnosis not present

## 2018-08-13 DIAGNOSIS — F53 Postpartum depression: Secondary | ICD-10-CM | POA: Diagnosis not present

## 2018-08-13 DIAGNOSIS — F419 Anxiety disorder, unspecified: Secondary | ICD-10-CM | POA: Diagnosis not present

## 2018-08-13 MED ORDER — SERTRALINE HCL 100 MG PO TABS: 100.0000 mg | ORAL_TABLET | Freq: Every day | ORAL | 1 refills | Status: DC

## 2018-08-13 MED ORDER — HYDROXYZINE HCL 25 MG PO TABS: ORAL_TABLET | ORAL | 1 refills | Status: DC

## 2018-08-13 NOTE — Progress Notes (Signed)
Obstetrics & Gynecology Office Visit   Chief Complaint  Patient presents with  . Postpartum Care  . Anxiety   History of Present Illness: 27 y.o. G65P1001 female who is PPD#14 from [facility]. She has a history of anxiety and depression.  She notes daily episodes of anxiety with panic attacks.  These normally last for a short period, but she returns to her normal level of high anxiety after. She also notes symptoms of depression, which is common for her. She notes no acute changes in her depression. She denies SI/HI. She is, however, concerned about her levels of anxiety.  She is taking zoloft 50 mg daily with no bad side effects.   Edinburgh Postnatal Depression Scale - 08/13/18 1139      Edinburgh Postnatal Depression Scale:  In the Past 7 Days   I have been able to laugh and see the funny side of things.  0    I have looked forward with enjoyment to things.  1    I have blamed myself unnecessarily when things went wrong.  3    I have been anxious or worried for no good reason.  3    I have felt scared or panicky for no good reason.  3    Things have been getting on top of me.  2    I have been so unhappy that I have had difficulty sleeping.  1    I have felt sad or miserable.  2    I have been so unhappy that I have been crying.  2    The thought of harming myself has occurred to me.  0    Edinburgh Postnatal Depression Scale Total  17       GAD 7 : Generalized Anxiety Score 08/13/2018  Nervous, Anxious, on Edge 3  Control/stop worrying 3  Worry too much - different things 3  Trouble relaxing 3  Restless 2  Easily annoyed or irritable 3  Afraid - awful might happen 3  Total GAD 7 Score 20  Anxiety Difficulty Very difficult    Past Medical History:  Diagnosis Date  . Asthma   . Depression   . Gestational diabetes   . Headache, migraine 07/14/2015  . Infertility, anovulation 03/10/2017  . Obesity   . PCOS (polycystic ovarian syndrome) 07/08/2016  . Prediabetes 07/08/2016   Hgb  5.8  04/2015   . Vitamin D deficiency 07/08/2016   18.7 in 04/2015     Past Surgical History:  Procedure Laterality Date  . INCISE AND DRAIN ABCESS Right 2011   Posterior Thigh  . sebaceous cyst removal  07/2017   Back of neck  . WISDOM TOOTH EXTRACTION      Gynecologic History: No LMP recorded.  Obstetric History: G1P1001  Family History  Problem Relation Age of Onset  . Osteoporosis Mother   . Alcohol abuse Father   . Drug abuse Father   . Alcohol abuse Maternal Aunt   . Bipolar disorder Maternal Aunt   . Diabetes Maternal Aunt   . Breast cancer Maternal Aunt   . Depression Paternal Grandmother   . COPD Paternal Grandmother   . Diabetes Maternal Uncle   . Diabetes Maternal Grandmother     Social History   Socioeconomic History  . Marital status: Married    Spouse name: Not on file  . Number of children: Not on file  . Years of education: Not on file  . Highest education level: Not on file  Occupational  History  . Not on file  Social Needs  . Financial resource strain: Not on file  . Food insecurity:    Worry: Not on file    Inability: Not on file  . Transportation needs:    Medical: Not on file    Non-medical: Not on file  Tobacco Use  . Smoking status: Never Smoker  . Smokeless tobacco: Never Used  Substance and Sexual Activity  . Alcohol use: Not Currently    Alcohol/week: 0.0 standard drinks    Frequency: Never    Comment: rarely  . Drug use: No  . Sexual activity: Yes    Birth control/protection: None  Lifestyle  . Physical activity:    Days per week: Not on file    Minutes per session: Not on file  . Stress: Not on file  Relationships  . Social connections:    Talks on phone: Not on file    Gets together: Not on file    Attends religious service: Not on file    Active member of club or organization: Not on file    Attends meetings of clubs or organizations: Not on file    Relationship status: Not on file  . Intimate partner violence:     Fear of current or ex partner: Not on file    Emotionally abused: Not on file    Physically abused: Not on file    Forced sexual activity: Not on file  Other Topics Concern  . Not on file  Social History Narrative  . Not on file    Allergies  Allergen Reactions  . Shellfish Allergy Nausea And Vomiting  . Amoxicillin Rash  . Lidoderm [Lidocaine] Rash    Prior to Admission medications   Medication Sig Start Date End Date Taking? Authorizing Provider  albuterol (PROVENTIL HFA;VENTOLIN HFA) 108 (90 Base) MCG/ACT inhaler Inhale 2 puffs into the lungs every 6 (six) hours as needed for wheezing or shortness of breath.   Yes [provider]  ferrous sulfate (FERROUSUL) 325 (65 FE) MG tablet Take 1 tablet (325 mg total) by mouth 2 (two) times daily. 05/30/18  Yes Schuman, Christanna R, MD  Loratadine (CLARITIN PO) Take 1 tablet by mouth daily.   Yes [provider]  norethindrone (MICRONOR,CAMILA,ERRIN) 0.35 MG tablet Take 1 tablet (0.35 mg total) by mouth daily. Start in 2 weeks 07/31/18  Yes Nadara MustardHarris, Robert P, MD  Prenatal Multivit-Min-Fe-FA (PRENATAL VITAMINS PO) Take 1 tablet by mouth daily.    Yes [provider]  sertraline (ZOLOFT) 50 MG tablet Take 1 tablet (50 mg total) by mouth daily. 02/13/18   Nadara MustardHarris, Robert P, MD    Review of Systems  Constitutional: Negative.   HENT: Negative.   Eyes: Negative.   Respiratory: Negative.   Cardiovascular: Negative.   Gastrointestinal: Negative.   Genitourinary: Negative.   Musculoskeletal: Negative.   Skin: Negative.   Neurological: Negative.   Psychiatric/Behavioral: Positive for depression. Negative for hallucinations, memory loss, substance abuse and suicidal ideas. The patient is nervous/anxious and has insomnia.      Physical Exam BP 104/70 (BP Location: Left Arm, Patient Position: Sitting, Cuff Size: Normal)   Pulse 67   Ht 5\' 8"  (1.727 m)   Wt 174 lb (78.9 kg)   SpO2 99%   BMI 26.46 kg/m  No LMP  recorded. Physical Exam  Constitutional: She is oriented to person, place, and time. She appears well-developed and well-nourished. No distress.  HENT:  Head: Normocephalic and atraumatic.  Eyes:  Conjunctivae are normal. No scleral icterus.  Musculoskeletal: Normal range of motion. She exhibits no edema.  Neurological: She is alert and oriented to person, place, and time. No cranial nerve deficit.  Skin: Skin is warm and dry. No erythema.  Psychiatric: She has a normal mood and affect. Her behavior is normal. Judgment normal.    Assessment: 27 y.o. G47P1001 female here for  1. Postpartum depression   2. Anxiety      Plan: Problem List Items Addressed This Visit      Other   Anxiety   Relevant Medications   sertraline (ZOLOFT) 100 MG tablet   hydrOXYzine (ATARAX/VISTARIL) 25 MG tablet   Depression - Primary   Relevant Medications   sertraline (ZOLOFT) 100 MG tablet   hydrOXYzine (ATARAX/VISTARIL) 25 MG tablet     She is able to contract for safety today. Will give her atarax for anxiety, as she refuses any benzodiazepine medications. Discussed potential effect of anti-histamines on breast milk production.   Increased zoloft to 100 mg.   Precautions for SI/HI given and she was instructed to go to the ER should she develop any symptoms of these.  She voiced understanding and agreement with the plan.   20 minutes spent in face to face discussion with > 50% spent in counseling,management, and coordination of care of her anxiety and depression.   Thomasene Mohair, MD, [facility], [facility] 08/13/2018 1:47 PM    Thomasene Mohair, MD 08/13/2018 11:41 AM

## 2018-08-13 NOTE — Progress Notes (Signed)
Routine Prenatal Care Visit  * this note was written by me, but originally started under the CMA.  I created the entire note and all documentation herein. I had to copy my note from the note begun by the CMA in order to put the note in my name.    Subjective  Brianna Marshall is a 27 y.o. G1P0 at 5566w5d being seen today for ongoing prenatal care.  She is currently monitored for the following issues for this high-risk pregnancy and has Headache, migraine; PCOS (polycystic ovarian syndrome); Prediabetes; Vitamin D deficiency; Infertility, anovulation; Supervision of high risk pregnancy, antepartum; Insulin resistance; Depression affecting pregnancy; Nausea/vomiting in pregnancy; Anemia in pregnancy; Hyperemesis; Gestational diabetes; Gestational diabetes mellitus (GDM) affecting third pregnancy; Antepartum mild preeclampsia, third trimester; Anxiety; and Depression on their problem list.  ----------------------------------------------------------------------------------- Patient reports: continues to have right upper quadrant pain. Hurst with certain activities and positions.  Not affected by eating.  Pain originally over ribs, but now is actually in her abdomen. No fevers or chills.  Denies nausea, vomiting. Ice helps, heating pad helps. Tylenol does not help.  No other associated symptoms.  Contractions: Not present. Vag. Bleeding: None.  Movement: Present. Denies leaking of fluid.  Growth today 73rd%ile, AFI 20.35 cm GDM: did not bring BG log today. This is, however, a highly reliable patient who is well-known to me. She reports nearly all values are normal. Her fasting values tend to be borderline, but normal. She will send me a photo of her BG log.  ----------------------------------------------------------------------------------- The following portions of the patient's history were reviewed and updated as appropriate: allergies, current medications, past family history, past medical history,  past social history, past surgical history and problem list. Problem list updated.   Objective  Blood pressure 110/72, pulse 99, weight 196 lb 8 oz (89.1 kg), last menstrual period 10/28/2017, unknown if currently breastfeeding. Pregravid weight 196 lb (88.9 kg) Total Weight Gain 8 oz (0.227 kg) Urinalysis:      Fetal Status: Fetal Heart Rate (bpm): 141   Movement: Present  Presentation: Vertex  General:  Alert, oriented and cooperative. Patient is in no acute distress.  Skin: Skin is warm and dry. No rash noted.   Cardiovascular: Normal heart rate noted  Respiratory: Normal respiratory effort, no problems with respiration noted  Abdomen: Soft, gravid, appropriate for gestational age. Pain/Pressure: Absent  TTP in RUQ, +Murphy's sign.  She is also tender along her actual ribs.  No rebound or guarding.  Pelvic:  Cervical exam deferred        Extremities: Normal range of motion.     Mental Status: Normal mood and affect. Normal behavior. Normal judgment and thought content.   No results found. Assessment   27 y.o. G1P0 at 8166w5d by  08/04/2018, by Last Menstrual Period presenting for routine prenatal visit  Plan   pregnancy Problems (from 01/02/18 to present)    Problem Noted Resolved   Gestational diabetes 06/11/2018 by Natale MilchSchuman, Christanna R, MD No   Hyperemesis 06/05/2018 by Natale MilchSchuman, Christanna R, MD No   Nausea/vomiting in pregnancy 01/17/2018 by Conard NovakJackson, Stephen D, MD No   Supervision of high risk pregnancy, antepartum 01/02/2018 by Conard NovakJackson, Stephen D, MD No   Overview Addendum 02/13/2018 10:05 AM by Nadara MustardHarris, Robert P, MD    Clinic [facility] Prenatal Labs  Dating Early US Blood type: O/Positive/-- (01/29 1038)   Genetic Screen 1 Screen: Neg Antibody:Negative (01/29 1038)  Anatomic US  Rubella: <0.90 (01/29 1038) Varicella: Imm  GTT Early: 123  Third trimester:  RPR: Non Reactive (01/29 1038)   Rhogam n/a HBsAg: Negative (01/29 1038)   TDaP vaccine            Flu Shot: 09/2017 HIV:  Non Reactive (01/29 1038)   Baby Food                                GBS:   Contraception  Pap:12/2017 normal  CBB     CS/VBAC n/a   Support Person            Insulin resistance 01/02/2018 by Conard Novak, MD No   Depression affecting pregnancy 01/02/2018 by Conard Novak, MD No   Overview Signed 01/02/2018  2:41 PM by Conard Novak, MD    - initial EPDS 21, patient wants to monitor during first trimester [ ]  consider starting zoloft second trimester.       Low-lying placenta 05/10/2018 by Natale Milch, MD 05/30/2018 by Natale Milch, MD   Overview Signed 05/10/2018 10:18 AM by Natale Milch, MD     Follow up at [redacted] weeks gestation          Preterm labor symptoms and general obstetric precautions including but not limited to vaginal bleeding, contractions, leaking of fluid and fetal movement were reviewed in detail with the patient. Please refer to After Visit Summary for other counseling recommendations.   - GDM: continue to keep bg log. - RUQ pain. Will obtain RUQ U/S.   Return in about 2 weeks (around 07/10/2018) for Routine Prenatal Appointment.  Thomasene Mohair, MD, [facility], [facility] 06/28/2018 11:23 AM

## 2018-09-10 ENCOUNTER — Encounter: Payer: Self-pay | Admitting: Obstetrics and Gynecology

## 2018-09-10 ENCOUNTER — Ambulatory Visit (INDEPENDENT_AMBULATORY_CARE_PROVIDER_SITE_OTHER): Payer: BLUE CROSS/BLUE SHIELD | Admitting: Obstetrics and Gynecology

## 2018-09-10 NOTE — Progress Notes (Signed)
Postpartum Visit   Chief Complaint  Patient presents with  . 6 week post partum   History of Present Illness: Patient is a 27 y.o. G1P1001 presents for postpartum visit.  Date of delivery: 07/30/2018 Type of delivery: Vaginal delivery - Vacuum or forceps assisted  no Episiotomy No.  Laceration: 3rd degree (minimal) Pregnancy or labor problems:  Preeclampsia, GDMA1 Any problems since the delivery:  Anxiety, on Zoloft  Newborn Details:  SINGLETON :  1. Baby's name: Alan Mulder. Birth weight: 9.5 lb Maternal Details:  Breast Feeding:  yes Post partum depression/anxiety noted:  Yes. Especially anxiety.  Edinburgh Post-Partum Depression Score:  9   Improvement in anxiety and depression. States is doing ok and would like to continue at current dose  Date of last PAP: 01/02/2018  NORMAL  Past Medical History:  Diagnosis Date  . Asthma   . Depression   . Gestational diabetes   . Headache, migraine 07/14/2015  . Infertility, anovulation 03/10/2017  . Obesity   . PCOS (polycystic ovarian syndrome) 07/08/2016  . Prediabetes 07/08/2016   Hgb 5.8  04/2015   . Vitamin D deficiency 07/08/2016   18.7 in 04/2015     Past Surgical History:  Procedure Laterality Date  . INCISE AND DRAIN ABCESS Right 2011   Posterior Thigh  . sebaceous cyst removal  07/2017   Back of neck  . WISDOM TOOTH EXTRACTION      Prior to Admission medications   Medication Sig Start Date End Date Taking? Authorizing Provider  albuterol (PROVENTIL HFA;VENTOLIN HFA) 108 (90 Base) MCG/ACT inhaler Inhale 2 puffs into the lungs every 6 (six) hours as needed for wheezing or shortness of breath.   Yes [provider]  hydrOXYzine (ATARAX/VISTARIL) 25 MG tablet Take 1-2 tablets 3 times daily, as needed for anxiety 08/13/18  Yes Conard Novak, MD  Loratadine (CLARITIN PO) Take 1 tablet by mouth daily.   Yes [provider]  norethindrone (MICRONOR,CAMILA,ERRIN) 0.35 MG tablet Take 1 tablet (0.35 mg total) by  mouth daily. Start in 2 weeks 07/31/18  Yes Nadara Mustard, MD  Prenatal Multivit-Min-Fe-FA (PRENATAL VITAMINS PO) Take 1 tablet by mouth daily.    Yes [provider]  sertraline (ZOLOFT) 100 MG tablet Take 1 tablet (100 mg total) by mouth daily. 08/13/18  Yes Conard Novak, MD    Allergies  Allergen Reactions  . Shellfish Allergy Nausea And Vomiting  . Amoxicillin Rash  . Lidoderm [Lidocaine] Rash     Social History   Socioeconomic History  . Marital status: Married    Spouse name: Not on file  . Number of children: Not on file  . Years of education: Not on file  . Highest education level: Not on file  Occupational History  . Not on file  Social Needs  . Financial resource strain: Not on file  . Food insecurity:    Worry: Not on file    Inability: Not on file  . Transportation needs:    Medical: Not on file    Non-medical: Not on file  Tobacco Use  . Smoking status: Never Smoker  . Smokeless tobacco: Never Used  Substance and Sexual Activity  . Alcohol use: Not Currently    Alcohol/week: 0.0 standard drinks    Frequency: Never    Comment: rarely  . Drug use: No  . Sexual activity: Yes    Birth control/protection: None  Lifestyle  . Physical activity:    Days per week: Not on file  Minutes per session: Not on file  . Stress: Not on file  Relationships  . Social connections:    Talks on phone: Not on file    Gets together: Not on file    Attends religious service: Not on file    Active member of club or organization: Not on file    Attends meetings of clubs or organizations: Not on file    Relationship status: Not on file  . Intimate partner violence:    Fear of current or ex partner: Not on file    Emotionally abused: Not on file    Physically abused: Not on file    Forced sexual activity: Not on file  Other Topics Concern  . Not on file  Social History Narrative  . Not on file    Family History  Problem Relation Age of Onset  .  Osteoporosis Mother   . Alcohol abuse Father   . Drug abuse Father   . Alcohol abuse Maternal Aunt   . Bipolar disorder Maternal Aunt   . Diabetes Maternal Aunt   . Breast cancer Maternal Aunt   . Depression Paternal Grandmother   . COPD Paternal Grandmother   . Diabetes Maternal Uncle   . Diabetes Maternal Grandmother     Review of Systems  Constitutional: Negative.   HENT: Negative.   Eyes: Negative.   Respiratory: Negative.   Cardiovascular: Negative.   Gastrointestinal: Negative.   Genitourinary: Negative.   Musculoskeletal: Negative.   Skin: Negative.   Neurological: Negative.   Psychiatric/Behavioral: Negative.      Physical Exam BP 118/74   Ht 5\' 8"  (1.727 m)   Wt 178 lb (80.7 kg)   BMI 27.06 kg/m   Physical Exam  Constitutional: She is oriented to person, place, and time. She appears well-developed and well-nourished. No distress.  Genitourinary: Vagina normal and uterus normal. Pelvic exam was performed with patient supine. There is no rash, tenderness or lesion on the right labia. There is no rash, tenderness or lesion on the left labia.    Right adnexum does not display mass, does not display tenderness and does not display fullness. Left adnexum does not display mass, does not display tenderness and does not display fullness. Cervix does not exhibit motion tenderness, lesion or nabothian cyst.  Eyes: EOM are normal. No scleral icterus.  Neck: Normal range of motion. Neck supple.  Cardiovascular: Normal rate and regular rhythm.  Pulmonary/Chest: Effort normal and breath sounds normal. No respiratory distress. She has no wheezes. She has no rales.  Abdominal: Soft. Bowel sounds are normal. She exhibits no distension and no mass. There is no tenderness. There is no rebound and no guarding.  Musculoskeletal: Normal range of motion. She exhibits no edema.  Neurological: She is alert and oriented to person, place, and time. No cranial nerve deficit.  Skin: Skin is  warm and dry. No erythema.  Psychiatric: She has a normal mood and affect. Her behavior is normal. Judgment normal.     Female Chaperone present during breast and/or pelvic exam.  Assessment: 27 y.o. G1P1001 presenting for 6 week postpartum visit  Plan: Problem List Items Addressed This Visit    None    Visit Diagnoses    Postpartum care and examination    -  Primary     1) Contraception Education given regarding options for contraception, including norethindrone.  2)  Pap - ASCCP guidelines and rational discussed.  Patient opts for routine screening interval  3) anxiety/depression: continue zoloft  at current dose.  Follow up 3-4 months.   Thomasene Mohair, MD 09/10/2018 10:33 AM

## 2018-10-15 ENCOUNTER — Other Ambulatory Visit: Payer: Self-pay | Admitting: Obstetrics & Gynecology

## 2018-10-15 DIAGNOSIS — F419 Anxiety disorder, unspecified: Secondary | ICD-10-CM

## 2018-10-15 MED ORDER — HYDROXYZINE HCL 25 MG PO TABS: ORAL_TABLET | ORAL | 1 refills | Status: DC

## 2018-10-31 ENCOUNTER — Telehealth: Payer: Self-pay

## 2018-10-31 NOTE — Telephone Encounter (Signed)
Huntley DecSara please advise

## 2018-10-31 NOTE — Telephone Encounter (Signed)
Please advise. Thank you

## 2018-10-31 NOTE — Telephone Encounter (Signed)
Patient is schedule 11/02/18 with SDJ

## 2018-10-31 NOTE — Telephone Encounter (Signed)
Please have her go see SDJ in [place] today. Or get Selena BattenKim to ask him about it since she is there with him today

## 2018-10-31 NOTE — Telephone Encounter (Signed)
Please advise 

## 2018-10-31 NOTE — Telephone Encounter (Signed)
Since she delivered 3 months ago, I do not think she needs an emergent work-in today.  However, I could see her Friday morning, if that works for her. Otherwise, early next week.  I do think it needs to be evaluated. Thank you

## 2018-10-31 NOTE — Telephone Encounter (Signed)
Pt is stating she noticed yesterday that where she had stiches from delivering her baby was bleeding and she looked in the mirror and saw a flap of skin kind of hanging. She is still bleeding from that area however it is only a little bit when she wipes. Please advise Thank you!

## 2018-11-02 ENCOUNTER — Encounter: Payer: Self-pay | Admitting: Obstetrics and Gynecology

## 2018-11-02 ENCOUNTER — Ambulatory Visit (INDEPENDENT_AMBULATORY_CARE_PROVIDER_SITE_OTHER): Payer: BLUE CROSS/BLUE SHIELD | Admitting: Obstetrics and Gynecology

## 2018-11-02 VITALS — BP 122/74 | Ht 68.0 in | Wt 198.0 lb

## 2018-11-02 DIAGNOSIS — E282 Polycystic ovarian syndrome: Secondary | ICD-10-CM | POA: Diagnosis not present

## 2018-11-02 DIAGNOSIS — L929 Granulomatous disorder of the skin and subcutaneous tissue, unspecified: Secondary | ICD-10-CM | POA: Diagnosis not present

## 2018-11-02 DIAGNOSIS — B9689 Other specified bacterial agents as the cause of diseases classified elsewhere: Secondary | ICD-10-CM | POA: Diagnosis not present

## 2018-11-02 DIAGNOSIS — N760 Acute vaginitis: Secondary | ICD-10-CM

## 2018-11-02 LAB — POCT WET PREP WITH KOH
KOH Prep POC: NEGATIVE
PH, VAGINAL: 7
Trichomonas, UA: NEGATIVE

## 2018-11-02 MED ORDER — METFORMIN HCL 500 MG PO TABS: ORAL_TABLET | ORAL | 11 refills | Status: DC

## 2018-11-02 MED ORDER — METRONIDAZOLE 500 MG PO TABS: 500.0000 mg | ORAL_TABLET | Freq: Two times a day (BID) | ORAL | 0 refills | Status: AC

## 2018-11-02 NOTE — Progress Notes (Signed)
Obstetrics & Gynecology Office Visit   Chief Complaint  Patient presents with  . Wound Check   History of Present Illness: 27 y.o. G1P1001 who is three months postpartum from an SVD. Notably, she had a 3rd degree laceration, though really only a small capsular disruption that was reinforced.  She states that she has continued vaginal spotting since birth (she is taking norethindrone contraceptive).  For a while she has been noticing a pink tinge to her discharge, which has been copious and at times soaking her underwear.  Recently, however, she noted bright red blood which she could not tell whether the source was vaginal, from her perineal laceration, or from her hemorrhoids.  She tried to use a mirror to look in the area and saw a flap of tissue on her perineum.  This concerned her so she decided to have it evaluated.  She denies pain with intercourse, as she has not had intercourse since the birth of her child.  She has mild discomfort when pressing on her perineum.  She is breast-feeding to a small extent.  She also request to restart metformin given her history of insulin resistance.   Past Medical History:  Diagnosis Date  . Asthma   . Depression   . Gestational diabetes   . Headache, migraine 07/14/2015  . Infertility, anovulation 03/10/2017  . Obesity   . PCOS (polycystic ovarian syndrome) 07/08/2016  . Prediabetes 07/08/2016   Hgb 5.8  04/2015   . Vitamin D deficiency 07/08/2016   18.7 in 04/2015     Past Surgical History:  Procedure Laterality Date  . INCISE AND DRAIN ABCESS Right 2011   Posterior Thigh  . sebaceous cyst removal  07/2017   Back of neck  . WISDOM TOOTH EXTRACTION      Gynecologic History: No LMP recorded.  Obstetric History: G1P1001  Family History  Problem Relation Age of Onset  . Osteoporosis Mother   . Alcohol abuse Father   . Drug abuse Father   . Alcohol abuse Maternal Aunt   . Bipolar disorder Maternal Aunt   . Diabetes Maternal Aunt   . Breast  cancer Maternal Aunt   . Depression Paternal Grandmother   . COPD Paternal Grandmother   . Diabetes Maternal Uncle   . Diabetes Maternal Grandmother     Social History   Socioeconomic History  . Marital status: Married    Spouse name: Not on file  . Number of children: Not on file  . Years of education: Not on file  . Highest education level: Not on file  Occupational History  . Not on file  Social Needs  . Financial resource strain: Not on file  . Food insecurity:    Worry: Not on file    Inability: Not on file  . Transportation needs:    Medical: Not on file    Non-medical: Not on file  Tobacco Use  . Smoking status: Never Smoker  . Smokeless tobacco: Never Used  Substance and Sexual Activity  . Alcohol use: Not Currently    Alcohol/week: 0.0 standard drinks    Frequency: Never    Comment: rarely  . Drug use: No  . Sexual activity: Yes    Birth control/protection: None  Lifestyle  . Physical activity:    Days per week: Not on file    Minutes per session: Not on file  . Stress: Not on file  Relationships  . Social connections:    Talks on phone: Not on file  Gets together: Not on file    Attends religious service: Not on file    Active member of club or organization: Not on file    Attends meetings of clubs or organizations: Not on file    Relationship status: Not on file  . Intimate partner violence:    Fear of current or ex partner: Not on file    Emotionally abused: Not on file    Physically abused: Not on file    Forced sexual activity: Not on file  Other Topics Concern  . Not on file  Social History Narrative  . Not on file    Allergies  Allergen Reactions  . Shellfish Allergy Nausea And Vomiting  . Amoxicillin Rash  . Lidoderm [Lidocaine] Rash    Prior to Admission medications   Medication Sig Start Date End Date Taking? Authorizing Provider  albuterol (PROVENTIL HFA;VENTOLIN HFA) 108 (90 Base) MCG/ACT inhaler Inhale 2 puffs into the lungs  every 6 (six) hours as needed for wheezing or shortness of breath.    [provider]  ferrous sulfate (FERROUSUL) 325 (65 FE) MG tablet Take 1 tablet (325 mg total) by mouth 2 (two) times daily. Patient not taking: Reported on 09/10/2018 05/30/18   Natale MilchSchuman, Christanna R, MD  hydrOXYzine (ATARAX/VISTARIL) 25 MG tablet Take 1-2 tablets 3 times daily, as needed for anxiety 10/15/18   Nadara MustardHarris, Robert P, MD  Loratadine (CLARITIN PO) Take 1 tablet by mouth daily.    [provider]  norethindrone (MICRONOR,CAMILA,ERRIN) 0.35 MG tablet Take 1 tablet (0.35 mg total) by mouth daily. Start in 2 weeks 07/31/18   Nadara MustardHarris, Robert P, MD  Prenatal Multivit-Min-Fe-FA (PRENATAL VITAMINS PO) Take 1 tablet by mouth daily.     [provider]  sertraline (ZOLOFT) 100 MG tablet Take 1 tablet (100 mg total) by mouth daily. 08/13/18   Conard NovakJackson, Jaquan Sadowsky D, MD    Review of Systems  Constitutional: Negative.   HENT: Negative.   Eyes: Negative.   Respiratory: Negative.   Cardiovascular: Negative.   Gastrointestinal: Negative.   Genitourinary: Negative.        See HPI  Musculoskeletal: Negative.   Skin: Negative.   Neurological: Negative.   Psychiatric/Behavioral: Negative.      Physical Exam BP 122/74   Ht 5\' 8"  (1.727 m)   Wt 198 lb (89.8 kg)   BMI 30.11 kg/m  No LMP recorded. Physical Exam  Constitutional: She is oriented to person, place, and time. She appears well-developed and well-nourished. No distress.  Genitourinary: Pelvic exam was performed with patient supine. There is no rash, tenderness or lesion on the right labia. There is no rash, tenderness or lesion on the left labia.     Eyes: EOM are normal. No scleral icterus.  Neck: Normal range of motion. Neck supple.  Cardiovascular: Normal rate and regular rhythm.  Pulmonary/Chest: Effort normal and breath sounds normal. No respiratory distress. She has no wheezes. She has no rales.  Abdominal: Soft. Bowel sounds are  normal. She exhibits no distension and no mass. There is no tenderness. There is no rebound and no guarding.  Musculoskeletal: Normal range of motion. She exhibits no edema.  Neurological: She is alert and oriented to person, place, and time. No cranial nerve deficit.  Skin: Skin is warm and dry. No erythema.  Psychiatric: She has a normal mood and affect. Her behavior is normal. Judgment normal.   Female chaperone present for pelvic and breast  portions of the physical exam  Wet Prep:  PH: 7.0 Clue Cells: Positive Fungal elements: Negative Trichomonas: Negative   Assessment: 27 y.o. G73P1001 female here for  1. Bacterial vaginosis   2. PCOS (polycystic ovarian syndrome)   3. Granulation tissue of skin      Plan: Problem List Items Addressed This Visit      Endocrine   PCOS (polycystic ovarian syndrome)   Relevant Medications   metFORMIN (GLUCOPHAGE) 500 MG tablet   Other Relevant Orders   Hgb A1c w/o eAG    Other Visit Diagnoses    Bacterial vaginosis    -  Primary   Relevant Medications   metroNIDAZOLE (FLAGYL) 500 MG tablet   Granulation tissue of skin         Granulation tissue: Given her discomfort on exam today, we discussed treatment options.  Will reevaluate in 3 months to see if the tissue has resolved.  If not, may numb the area with a local anesthetic to remove in clinic.  She was also instructed to return to clinic if she noted the tissue to be getting larger or her bleeding to increase from that tissue.  Bacterial vaginosis: We will treat with Flagyl with the usual regimen.  Polycystic ovarian syndrome: We will restart metformin slowly.  Will obtain a hemoglobin A1c as a baseline today.  She states that she has gained 30 pounds since the delivery of her child about 3 months ago and is concerned this is due to an excess of insulin resistance.  Return for Keep previously scheduld appointment.  Thomasene Mohair, MD 11/02/2018 11:13 AM

## 2018-11-03 LAB — HGB A1C W/O EAG: HEMOGLOBIN A1C: 5.2 % (ref 4.8–5.6)

## 2018-11-09 IMAGING — RF DG HYSTEROGRAM
12 series · 12 of 12 positions shown · non-contrast
Comparison: No recent.

CLINICAL DATA: Infertility evaluation.

EXAM:
HYSTEROSALPINGOGRAM
TECHNIQUE: During the performance of HSG by gynecology spot film images
obtained.

[Series 1: fluoro_hsg_singleshot_bw · 0.18mm/px · 1 of 1 slices shown (1 of 12)]
[im 1/1]
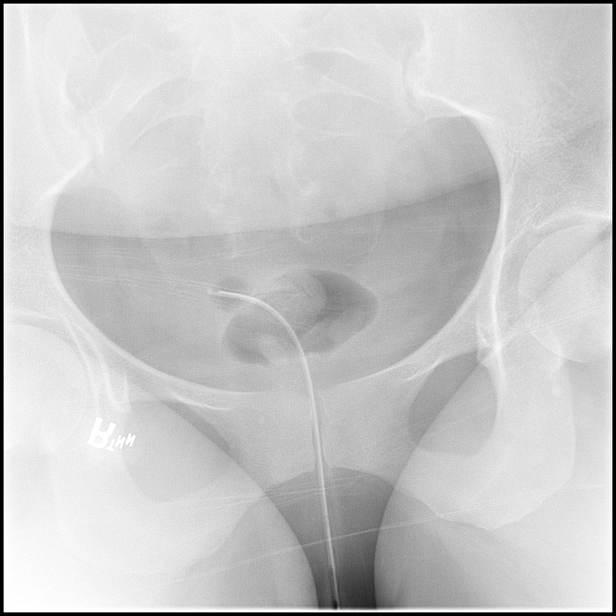

[Series 2: fluoro_hsg_singleshot_bw · 0.18mm/px · 1 of 1 slices shown (2 of 12)]
[im 1/1]
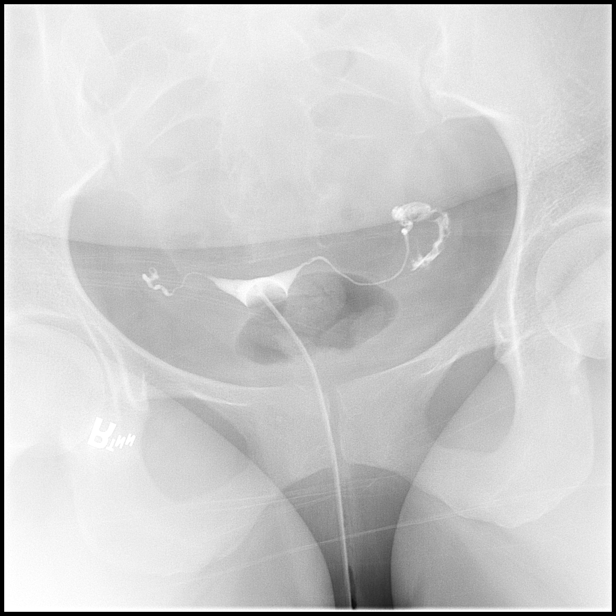

[Series 3: fluoro_hsg_singleshot_bw · 0.18mm/px · 1 of 1 slices shown (3 of 12)]
[im 1/1]
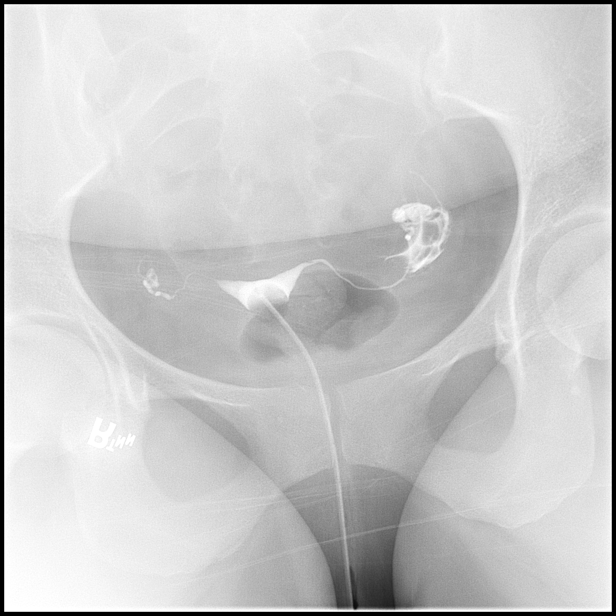

[Series 4: fluoro_hsg_singleshot_bw · 0.18mm/px · 1 of 1 slices shown (4 of 12)]
[im 1/1]
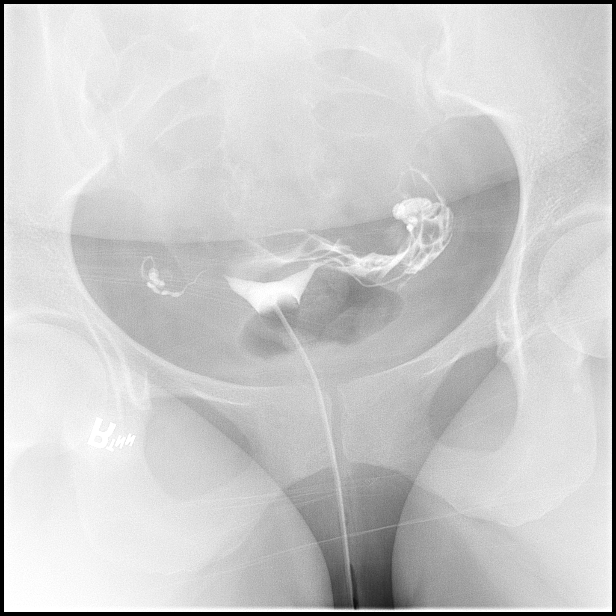

[Series 5: fluoro_hsg_singleshot_bw · 0.18mm/px · 1 of 1 slices shown (5 of 12)]
[im 1/1]
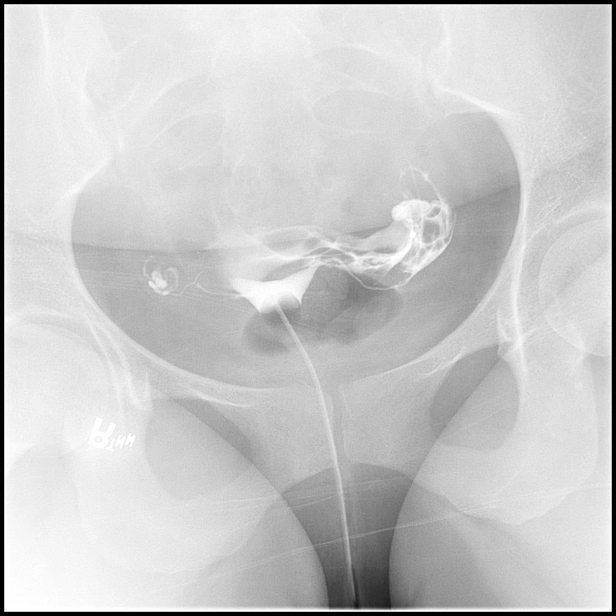

[Series 6: fluoro_hsg_singleshot_bw · 0.18mm/px · 1 of 1 slices shown (6 of 12)]
[im 1/1]
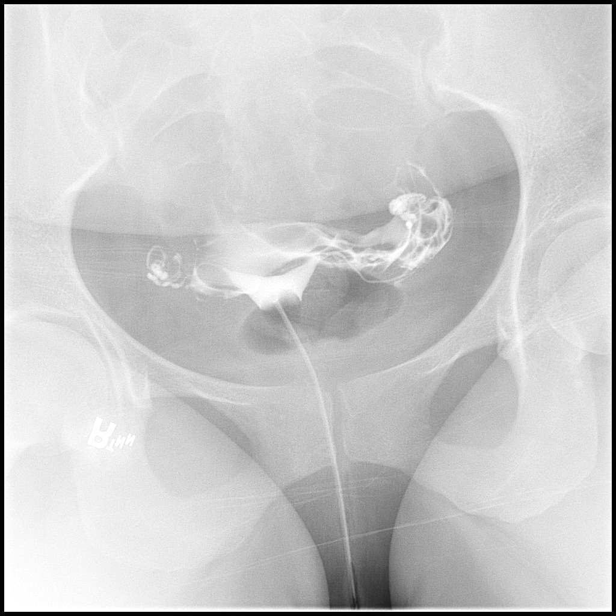

[Series 7: fluoro_hsg_singleshot_bw · 0.18mm/px · 1 of 1 slices shown (7 of 12)]
[im 1/1]
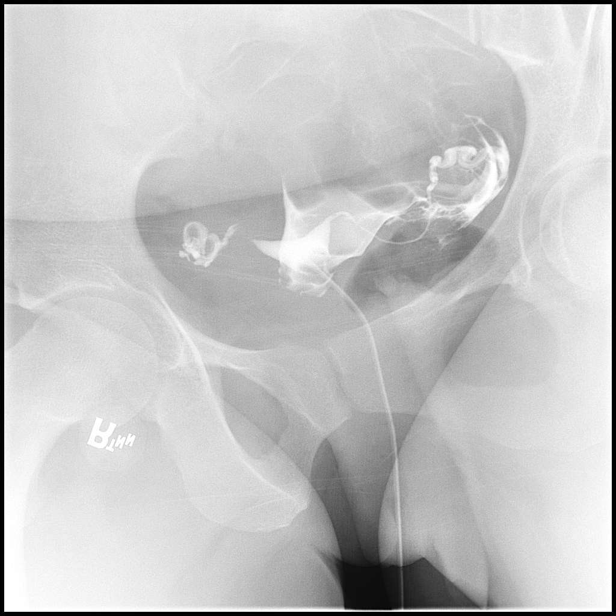

[Series 8: fluoro_hsg_singleshot_bw · 0.18mm/px · 1 of 1 slices shown (8 of 12)]
[im 1/1]
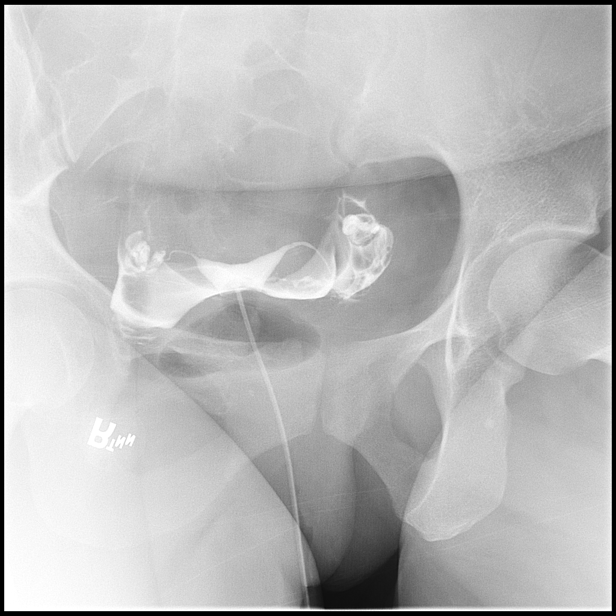

[Series 9: fluoro_hsg_singleshot_bw · 0.18mm/px · 1 of 1 slices shown (9 of 12)]
[im 1/1]
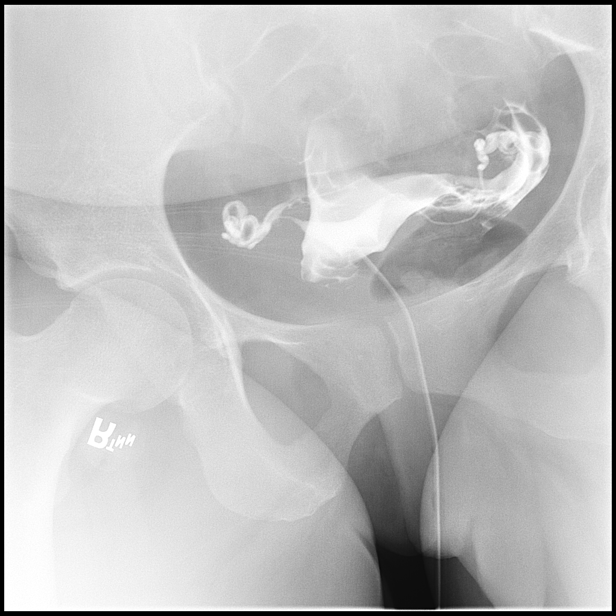

[Series 10: fluoro_hsg_singleshot_bw · 0.18mm/px · 1 of 1 slices shown (10 of 12)]
[im 1/1]
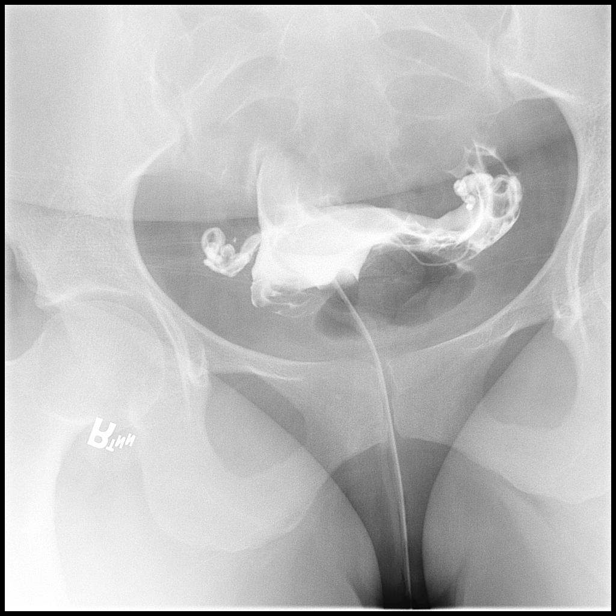

[Series 11: fluoro_hsg_singleshot_bw · 0.18mm/px · 1 of 1 slices shown (11 of 12)]
[im 1/1]
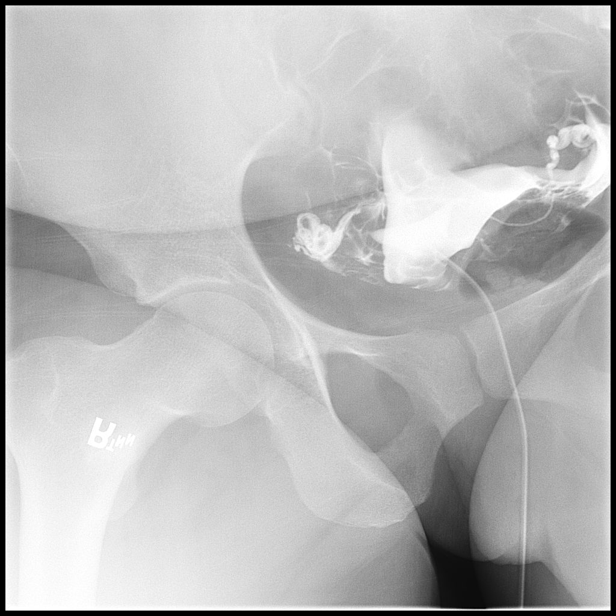

[Series 12: fluoro_hsg_singleshot_bw · 0.18mm/px · 1 of 1 slices shown (12 of 12)]
[im 1/1]
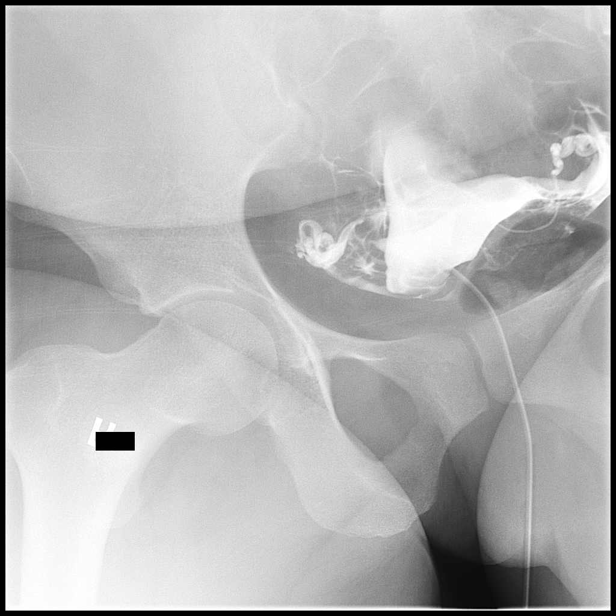

[12 of 12 positions shown; findings below may reference images not displayed]

FLUOROSCOPY TIME:  Radiation Exposure Index (as provided by the
fluoroscopic device): 28.0 mGy

If the device does not provide the exposure index:

Fluoroscopy Time:  2 minutes 54 seconds

Number of Acquired Images:  12
FINDINGS: Uterus appears normal. Fallopian tubes appear to be patent.
Bilateral contrast spill appears to be present.
IMPRESSION: Normal exam.

## 2018-11-21 ENCOUNTER — Encounter: Payer: Self-pay | Admitting: Internal Medicine

## 2018-11-21 ENCOUNTER — Ambulatory Visit: Payer: BLUE CROSS/BLUE SHIELD | Admitting: Internal Medicine

## 2018-11-21 VITALS — BP 102/70 | HR 80 | Ht 68.0 in | Wt 203.0 lb

## 2018-11-21 DIAGNOSIS — F331 Major depressive disorder, recurrent, moderate: Secondary | ICD-10-CM

## 2018-11-21 DIAGNOSIS — G43009 Migraine without aura, not intractable, without status migrainosus: Secondary | ICD-10-CM

## 2018-11-21 MED ORDER — SUMATRIPTAN SUCCINATE 100 MG PO TABS: 100.0000 mg | ORAL_TABLET | ORAL | 2 refills | Status: DC | PRN

## 2018-11-21 NOTE — Progress Notes (Signed)
Date:  11/21/2018   Name:  Brianna AskewChristine S Blacksher   DOB:  Feb 15, 1991   MRN:  [id number]   Chief Complaint: Migraine (Having these since she was 11. Had none when pregnant. Started to have them after childbirth once monlthy. Starting 2 weeks ago having them everyday. Tried husband sumatriptan and it helped. Taken medication in past. Stopped breastfeeding two weeks ago. Migraines last 6-8 hours.   )  Migraine   This is a recurrent problem. The current episode started more than 1 month ago. The problem occurs daily. The pain is located in the left unilateral region. The pain does not radiate. The pain quality is similar to prior headaches. The quality of the pain is described as pulsating and stabbing. The pain is moderate. Associated symptoms include nausea, phonophobia, photophobia and vomiting. Pertinent negatives include no coughing, dizziness, fever or sinus pressure. She has tried triptans for the symptoms. The treatment provided significant relief.   Depression - she had depression before her pregnancy then had to change the medication due to pregnancy and breast feeding.  Her GYN is managing and had mentioned tapering the meds at her next visit.  Just thinking about tapering is making her anxious.  Right now she feels that she is doing fairly well.   Review of Systems  Constitutional: Negative for chills, fatigue and fever.  HENT: Negative for sinus pressure.   Eyes: Positive for photophobia.  Respiratory: Negative for cough, chest tightness, shortness of breath and wheezing.   Cardiovascular: Negative for chest pain and palpitations.  Gastrointestinal: Positive for nausea and vomiting.  Neurological: Positive for headaches. Negative for dizziness and light-headedness.    Patient Active Problem List   Diagnosis Date Noted  . Anxiety 08/13/2018  . Depression 08/13/2018  . Antepartum mild preeclampsia, third trimester 07/30/2018  . Gestational diabetes mellitus (GDM) affecting third  pregnancy 07/29/2018  . Gestational diabetes 06/11/2018  . Hyperemesis 06/05/2018  . Anemia in pregnancy 05/30/2018  . Nausea/vomiting in pregnancy 01/17/2018  . Supervision of high risk pregnancy, antepartum 01/02/2018  . Insulin resistance 01/02/2018  . Depression affecting pregnancy 01/02/2018  . Infertility, anovulation 03/10/2017  . PCOS (polycystic ovarian syndrome) 07/08/2016  . Prediabetes 07/08/2016  . Vitamin D deficiency 07/08/2016  . Headache, migraine 07/14/2015    Allergies  Allergen Reactions  . Shellfish Allergy Nausea And Vomiting  . Amoxicillin Rash  . Lidoderm [Lidocaine] Rash    Past Surgical History:  Procedure Laterality Date  . INCISE AND DRAIN ABCESS Right 2011   Posterior Thigh  . sebaceous cyst removal  07/2017   Back of neck  . WISDOM TOOTH EXTRACTION      Social History   Tobacco Use  . Smoking status: Never Smoker  . Smokeless tobacco: Never Used  Substance Use Topics  . Alcohol use: Not Currently    Alcohol/week: 0.0 standard drinks    Frequency: Never    Comment: rarely  . Drug use: No     Medication list has been reviewed and updated.  Current Meds  Medication Sig  . albuterol (PROVENTIL HFA;VENTOLIN HFA) 108 (90 Base) MCG/ACT inhaler Inhale 2 puffs into the lungs every 6 (six) hours as needed for wheezing or shortness of breath.  . ferrous sulfate (FERROUSUL) 325 (65 FE) MG tablet Take 1 tablet (325 mg total) by mouth 2 (two) times daily.  . hydrOXYzine (ATARAX/VISTARIL) 25 MG tablet Take 1-2 tablets 3 times daily, as needed for anxiety  . Loratadine (CLARITIN PO) Take 1 tablet  by mouth daily.  . metFORMIN (GLUCOPHAGE) 500 MG tablet Take 1 tab PO QAM x 7 days, increase to 1 tab BID x 7 days, then 2 tabs QAM and 1 tab QPM x 7, then 2 tabs BID  . norethindrone (MICRONOR,CAMILA,ERRIN) 0.35 MG tablet Take 1 tablet (0.35 mg total) by mouth daily. Start in 2 weeks  . sertraline (ZOLOFT) 100 MG tablet Take 1 tablet (100 mg total) by  mouth daily.    PHQ 2/9 Scores 11/21/2018 06/05/2018 11/06/2017 07/08/2016  PHQ - 2 Score 2 3 3 1   PHQ- 9 Score 17 17 11  -  Exception Documentation - - - Other- indicate reason in comment box  Not completed - - - follows therapist    Physical Exam  Constitutional: She is oriented to person, place, and time. She appears well-developed. No distress.  HENT:  Head: Normocephalic and atraumatic.  Neck: Normal range of motion. Neck supple.  Cardiovascular: Normal rate, regular rhythm and normal heart sounds.  Pulmonary/Chest: Effort normal and breath sounds normal. No respiratory distress.  Musculoskeletal: Normal range of motion.  Lymphadenopathy:    She has no cervical adenopathy.  Neurological: She is alert and oriented to person, place, and time. Gait normal.  Skin: Skin is warm and dry. No rash noted.  Psychiatric: She has a normal mood and affect. Her behavior is normal. Thought content normal.  Nursing note and vitals reviewed.   BP 102/70 (BP Location: Right Arm, Patient Position: Sitting, Cuff Size: Normal)   Pulse 80   Ht 5\' 8"  (1.727 m)   Wt 203 lb (92.1 kg)   SpO2 99%   BMI 30.87 kg/m   Assessment and Plan: 1. Migraine without aura and without status migrainosus, not intractable Begin Trokendi 25 mg qhs (sample given) Call for Rx if helpful after 2 weeks Use imitrex as needed for migraine, no more than once per day - SUMAtriptan (IMITREX) 100 MG tablet; Take 1 tablet (100 mg total) by mouth every 2 (two) hours as needed for migraine. May repeat in 2 hours if headache persists or recurs.  Dispense: 10 tablet; Refill: 2  2. Major depressive disorder, recurrent episode, moderate (HCC) Continue current medication Recommend discuss continuing medication with her GYN at visit later this month   Partially dictated using Animal nutritionist. Any errors are unintentional.  Bari Edward, MD [facility]  11/21/2018

## 2018-12-03 ENCOUNTER — Encounter: Payer: Self-pay | Admitting: Internal Medicine

## 2018-12-04 ENCOUNTER — Telehealth: Payer: Self-pay

## 2018-12-04 ENCOUNTER — Other Ambulatory Visit: Payer: Self-pay | Admitting: Internal Medicine

## 2018-12-04 DIAGNOSIS — G43009 Migraine without aura, not intractable, without status migrainosus: Secondary | ICD-10-CM

## 2018-12-04 MED ORDER — TOPIRAMATE ER 25 MG PO CAP24: 1.0000 | ORAL_CAPSULE | Freq: Every day | ORAL | 5 refills | Status: DC

## 2018-12-04 NOTE — Telephone Encounter (Signed)
Patient would like to continue samples. Please Advise.

## 2018-12-04 NOTE — Telephone Encounter (Signed)
Completed PA for patient medication Topiramate ER (TROKENDI XR) 25 MG through [email or web address].  Received this note after completion:  Your information has been submitted to [facility] [place]. [employer] [place] will review the request and fax you a determination directly, typically within 3 business days of your submission once all necessary information is received. If [employer] [place] has not responded in 3 business days or if you have any questions about your submission, contact [employer] [place] at [phone].  Awaiting outcome.  KEY: [id number]

## 2018-12-05 ENCOUNTER — Encounter: Payer: Self-pay | Admitting: Obstetrics and Gynecology

## 2018-12-05 ENCOUNTER — Ambulatory Visit (INDEPENDENT_AMBULATORY_CARE_PROVIDER_SITE_OTHER): Payer: BLUE CROSS/BLUE SHIELD | Admitting: Obstetrics and Gynecology

## 2018-12-05 VITALS — BP 132/75 | HR 70 | Wt 208.0 lb

## 2018-12-05 DIAGNOSIS — F53 Postpartum depression: Secondary | ICD-10-CM

## 2018-12-05 DIAGNOSIS — B9689 Other specified bacterial agents as the cause of diseases classified elsewhere: Secondary | ICD-10-CM

## 2018-12-05 DIAGNOSIS — N760 Acute vaginitis: Secondary | ICD-10-CM | POA: Diagnosis not present

## 2018-12-05 DIAGNOSIS — F419 Anxiety disorder, unspecified: Secondary | ICD-10-CM

## 2018-12-05 DIAGNOSIS — O99345 Other mental disorders complicating the puerperium: Secondary | ICD-10-CM

## 2018-12-05 MED ORDER — METRONIDAZOLE 0.75 % VA GEL: 1.0000 | Freq: Every day | VAGINAL | 0 refills | Status: AC

## 2018-12-05 MED ORDER — BUSPIRONE HCL 5 MG PO TABS: ORAL_TABLET | ORAL | 0 refills | Status: DC

## 2018-12-05 MED ORDER — SERTRALINE HCL 100 MG PO TABS: 100.0000 mg | ORAL_TABLET | Freq: Every day | ORAL | 5 refills | Status: DC

## 2018-12-05 NOTE — Progress Notes (Addendum)
Obstetrics & Gynecology Office Visit   Chief Complaint  Patient presents with  . Follow-up    Medication follow up Postpartum depression    History of Present Illness: 27 y.o. G12P1001 female who presents in follow up from anxiety and depression. She is taking Zoloft 100 mg.  She states things are going OK.  She states that the medication is the only reason that is holding things together.  Prior to pregnancy she took medication.   The medication was prescribed by the psychiatrist at the hospital.  She states her anxiety is the worst of the two right now.  She feels like she has ADHD. She feels like she is "all over the place."  She states that she needs to be doing something.  Denies SI/HI. States that she feels great in that regard.  She is no longer breast feeding.   GAD 7: 17 PHQ 9: 21  Past Medical History:  Diagnosis Date  . Anemia in pregnancy 05/30/2018  . Asthma   . Depression   . Gestational diabetes   . Gestational diabetes 06/11/2018  . Gestational diabetes mellitus (GDM) affecting third pregnancy 07/29/2018  . Headache, migraine 07/14/2015  . Hyperemesis 06/05/2018  . Infertility, anovulation 03/10/2017  . Infertility, anovulation 03/10/2017  . Nausea/vomiting in pregnancy 01/17/2018  . Obesity   . PCOS (polycystic ovarian syndrome) 07/08/2016  . Prediabetes 07/08/2016   Hgb 5.8  04/2015   . Vitamin D deficiency 07/08/2016   18.7 in 04/2015     Past Surgical History:  Procedure Laterality Date  . INCISE AND DRAIN ABCESS Right 2011   Posterior Thigh  . sebaceous cyst removal  07/2017   Back of neck  . WISDOM TOOTH EXTRACTION      Gynecologic History: No LMP recorded.  Obstetric History: G1P1001  Family History  Problem Relation Age of Onset  . Osteoporosis Mother   . Alcohol abuse Father   . Drug abuse Father   . Alcohol abuse Maternal Aunt   . Bipolar disorder Maternal Aunt   . Diabetes Maternal Aunt   . Breast cancer Maternal Aunt   . Depression Paternal  Grandmother   . COPD Paternal Grandmother   . Diabetes Maternal Uncle   . Diabetes Maternal Grandmother     Social History   Socioeconomic History  . Marital status: Married    Spouse name: Not on file  . Number of children: Not on file  . Years of education: Not on file  . Highest education level: Not on file  Occupational History  . Not on file  Social Needs  . Financial resource strain: Not on file  . Food insecurity:    Worry: Not on file    Inability: Not on file  . Transportation needs:    Medical: Not on file    Non-medical: Not on file  Tobacco Use  . Smoking status: Never Smoker  . Smokeless tobacco: Never Used  Substance and Sexual Activity  . Alcohol use: Not Currently    Alcohol/week: 0.0 standard drinks    Frequency: Never    Comment: rarely  . Drug use: No  . Sexual activity: Yes    Birth control/protection: None  Lifestyle  . Physical activity:    Days per week: Not on file    Minutes per session: Not on file  . Stress: Not on file  Relationships  . Social connections:    Talks on phone: Not on file    Gets together: Not on file  Attends religious service: Not on file    Active member of club or organization: Not on file    Attends meetings of clubs or organizations: Not on file    Relationship status: Not on file  . Intimate partner violence:    Fear of current or ex partner: Not on file    Emotionally abused: Not on file    Physically abused: Not on file    Forced sexual activity: Not on file  Other Topics Concern  . Not on file  Social History Narrative  . Not on file    Allergies  Allergen Reactions  . Shellfish Allergy Nausea And Vomiting  . Amoxicillin Rash  . Lidoderm [Lidocaine] Rash    Prior to Admission medications   Medication Sig Start Date End Date Taking? Authorizing Provider  albuterol (PROVENTIL HFA;VENTOLIN HFA) 108 (90 Base) MCG/ACT inhaler Inhale 2 puffs into the lungs every 6 (six) hours as needed for wheezing or  shortness of breath.   Yes [provider]  ferrous sulfate (FERROUSUL) 325 (65 FE) MG tablet Take 1 tablet (325 mg total) by mouth 2 (two) times daily. 05/30/18  Yes Schuman, Christanna R, MD  hydrOXYzine (ATARAX/VISTARIL) 25 MG tablet Take 1-2 tablets 3 times daily, as needed for anxiety 10/15/18  Yes Nadara Mustard, MD  Loratadine (CLARITIN PO) Take 1 tablet by mouth daily.   Yes [provider]  metFORMIN (GLUCOPHAGE) 500 MG tablet Take 1 tab PO QAM x 7 days, increase to 1 tab BID x 7 days, then 2 tabs QAM and 1 tab QPM x 7, then 2 tabs BID 11/02/18  Yes Conard Novak, MD  norethindrone (MICRONOR,CAMILA,ERRIN) 0.35 MG tablet Take 1 tablet (0.35 mg total) by mouth daily. Start in 2 weeks 07/31/18  Yes Nadara Mustard, MD  sertraline (ZOLOFT) 100 MG tablet Take 1 tablet (100 mg total) by mouth daily. 08/13/18  Yes Conard Novak, MD  SUMAtriptan (IMITREX) 100 MG tablet Take 1 tablet (100 mg total) by mouth every 2 (two) hours as needed for migraine. May repeat in 2 hours if headache persists or recurs. 11/21/18  Yes Reubin Milan, MD  Topiramate ER (TROKENDI XR) 25 MG CP24 Take 1 capsule by mouth at bedtime. 12/04/18  Yes Reubin Milan, MD    Review of Systems  Constitutional: Negative.   HENT: Negative.   Eyes: Negative.   Respiratory: Negative.   Cardiovascular: Negative.   Gastrointestinal: Negative.   Genitourinary: Negative.   Musculoskeletal: Negative.   Skin: Negative.   Neurological: Negative.   Psychiatric/Behavioral: Negative.    Physical Exam BP 132/75 (BP Location: Left Arm, Patient Position: Sitting, Cuff Size: Large)   Pulse 70   Wt 208 lb (94.3 kg)   Breastfeeding No   BMI 31.63 kg/m  No LMP recorded. Physical Exam Constitutional:      General: She is not in acute distress.    Appearance: Normal appearance.  HENT:     Head: Normocephalic and atraumatic.  Eyes:     General: No scleral icterus.    Conjunctiva/sclera: Conjunctivae  normal.  Pulmonary:     Effort: Pulmonary effort is normal. No respiratory distress.  Musculoskeletal: Normal range of motion.        General: No swelling.  Neurological:     Mental Status: She is alert.  Psychiatric:        Mood and Affect: Mood normal.        Behavior: Behavior normal.  Thought Content: Thought content normal.        Judgment: Judgment normal.     Assessment: 27 y.o. G1P1001 female here for  1. Anxiety   2. Postpartum depression   3. Bacterial vaginosis      Plan: Problem List Items Addressed This Visit      Other   Anxiety - Primary   Relevant Medications   sertraline (ZOLOFT) 100 MG tablet   busPIRone (BUSPAR) 5 MG tablet    Other Visit Diagnoses    Postpartum depression       Relevant Medications   sertraline (ZOLOFT) 100 MG tablet   busPIRone (BUSPAR) 5 MG tablet   Bacterial vaginosis       Relevant Medications   metroNIDAZOLE (METROGEL) 0.75 % vaginal gel     Anxiety: Add buspirone today.  She may discontinue hydroxyzine.  We will increase dose by starting with buspirone 5 mg p.o. twice daily for 1 week.  Will increase to 10 mg in the morning and continue 5 mg at night and week 2.  The third week she is to increase to 10 mg in the morning and 10 mg at night.  She is to follow-up in about 6 weeks to assess response.  She was encouraged to return to clinic if symptoms worsen or if she has side effects.  Though she has no symptoms today, she was instructed to go to the emergency room should she develop suicidal or homicidal ideation.  Bacterial vaginosis: She mentions symptoms similar to prior which were treated with oral metronidazole.  Her symptoms resolved for a while, but appear to have returned.  I will treat presumptively using a topical gel today.  If her symptoms continue, she was instructed to return to clinic for full evaluation.  Thomasene MohairStephen Sreekar Broyhill, MD 12/05/2018 6:02 PM

## 2018-12-06 NOTE — Telephone Encounter (Signed)
[facility] stating it was DEN[facility].  States:  "This medication is not on the formulary and is covered to treat members for migraine prevention when treatment with topiramate and one additional preferred medication (specifically, a beta blocker, amitriptyline, or divalproex sodium/ sodium valproate) did not work or was not tolerated. "  Gave to Dr Judithann GravesBerglund - will change medication and call and inform patient.

## 2018-12-07 ENCOUNTER — Other Ambulatory Visit: Payer: Self-pay | Admitting: Obstetrics and Gynecology

## 2018-12-07 DIAGNOSIS — F3341 Major depressive disorder, recurrent, in partial remission: Secondary | ICD-10-CM

## 2018-12-07 DIAGNOSIS — F419 Anxiety disorder, unspecified: Secondary | ICD-10-CM

## 2018-12-07 MED ORDER — BUSPIRONE HCL 5 MG PO TABS: 10.0000 mg | ORAL_TABLET | Freq: Two times a day (BID) | ORAL | 1 refills | Status: DC

## 2018-12-07 NOTE — Telephone Encounter (Signed)
Would you mind calling in a prescription for her. The sig should be 10mg  twice daily (2 tabs twice daily), dispense 120 tablets with 1 refill.  Thank you!

## 2018-12-08 ENCOUNTER — Other Ambulatory Visit: Payer: Self-pay | Admitting: Obstetrics & Gynecology

## 2018-12-08 DIAGNOSIS — F3341 Major depressive disorder, recurrent, in partial remission: Secondary | ICD-10-CM

## 2018-12-08 DIAGNOSIS — F419 Anxiety disorder, unspecified: Secondary | ICD-10-CM

## 2018-12-08 MED ORDER — BUSPIRONE HCL 5 MG PO TABS: 10.0000 mg | ORAL_TABLET | Freq: Two times a day (BID) | ORAL | 1 refills | Status: DC

## 2018-12-13 ENCOUNTER — Encounter: Payer: Self-pay | Admitting: Internal Medicine

## 2018-12-14 ENCOUNTER — Other Ambulatory Visit: Payer: Self-pay | Admitting: Internal Medicine

## 2018-12-14 DIAGNOSIS — G43009 Migraine without aura, not intractable, without status migrainosus: Secondary | ICD-10-CM

## 2018-12-14 MED ORDER — TOPIRAMATE 25 MG PO TABS: 25.0000 mg | ORAL_TABLET | Freq: Every day | ORAL | 2 refills | Status: DC

## 2018-12-17 ENCOUNTER — Ambulatory Visit: Payer: BLUE CROSS/BLUE SHIELD | Admitting: Obstetrics and Gynecology

## 2019-01-16 ENCOUNTER — Encounter: Payer: Self-pay | Admitting: Emergency Medicine

## 2019-01-16 ENCOUNTER — Ambulatory Visit
Admission: EM | Admit: 2019-01-16 | Discharge: 2019-01-16 | Disposition: A | Discharge status: Home / Self Care | Payer: BC Managed Care – PPO | Attending: Family Medicine | Admitting: Family Medicine

## 2019-01-16 ENCOUNTER — Other Ambulatory Visit: Payer: Self-pay

## 2019-01-16 ENCOUNTER — Encounter: Payer: Self-pay | Admitting: Obstetrics and Gynecology

## 2019-01-16 ENCOUNTER — Ambulatory Visit (INDEPENDENT_AMBULATORY_CARE_PROVIDER_SITE_OTHER): Payer: BC Managed Care – PPO | Admitting: Obstetrics and Gynecology

## 2019-01-16 VITALS — BP 150/71 | HR 88 | Wt 211.0 lb

## 2019-01-16 DIAGNOSIS — F419 Anxiety disorder, unspecified: Secondary | ICD-10-CM

## 2019-01-16 DIAGNOSIS — J029 Acute pharyngitis, unspecified: Secondary | ICD-10-CM

## 2019-01-16 DIAGNOSIS — F53 Postpartum depression: Secondary | ICD-10-CM | POA: Diagnosis not present

## 2019-01-16 DIAGNOSIS — O99345 Other mental disorders complicating the puerperium: Secondary | ICD-10-CM

## 2019-01-16 DIAGNOSIS — F3341 Major depressive disorder, recurrent, in partial remission: Secondary | ICD-10-CM

## 2019-01-16 LAB — RAPID INFLUENZA A&B ANTIGENS (ARMC ONLY)
INFLUENZA A (ARMC): NEGATIVE
INFLUENZA B (ARMC): NEGATIVE

## 2019-01-16 LAB — RAPID STREP SCREEN (MED CTR MEBANE ONLY): STREPTOCOCCUS, GROUP A SCREEN (DIRECT): NEGATIVE

## 2019-01-16 MED ORDER — BUSPIRONE HCL 10 MG PO TABS: 10.0000 mg | ORAL_TABLET | Freq: Two times a day (BID) | ORAL | 1 refills | Status: DC

## 2019-01-16 MED ORDER — SERTRALINE HCL 100 MG PO TABS: 150.0000 mg | ORAL_TABLET | Freq: Every day | ORAL | 1 refills | Status: DC

## 2019-01-16 NOTE — ED Triage Notes (Signed)
Patient c/o sore throat that started this morning.  Patient denies fevers.  

## 2019-01-16 NOTE — ED Provider Notes (Signed)
[facility]    CSN: [id number] Arrival date & time: 01/16/19  0857  History   Chief Complaint Chief Complaint  Patient presents with  . Sore Throat   HPI  28 year old female presents with sore throat.  Sore throat started this morning.  Mild in severity. She reports associated postnasal drip.  No fever.  No chills.  No body aches.  No other respiratory symptoms.  Patient states that there has been strep going around and she is concerned that she may have strep.  No medications or interventions tried.  No known exacerbating factors.  No other complaints.  PMH, Surgical Hx, Family Hx, Social History reviewed and updated as below.  Past Medical History:  Diagnosis Date  . Anemia in pregnancy 05/30/2018  . Asthma   . Depression   . Gestational diabetes   . Gestational diabetes 06/11/2018  . Gestational diabetes mellitus (GDM) affecting third pregnancy 07/29/2018  . Headache, migraine 07/14/2015  . Hyperemesis 06/05/2018  . Infertility, anovulation 03/10/2017  . Infertility, anovulation 03/10/2017  . Nausea/vomiting in pregnancy 01/17/2018  . Obesity   . PCOS (polycystic ovarian syndrome) 07/08/2016  . Prediabetes 07/08/2016   Hgb 5.8  04/2015   . Vitamin D deficiency 07/08/2016   18.7 in 04/2015     Patient Active Problem List   Diagnosis Date Noted  . Anxiety 08/13/2018  . Antepartum mild preeclampsia, third trimester 07/30/2018  . Supervision of high risk pregnancy, antepartum 01/02/2018  . Insulin resistance 01/02/2018  . 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  . PCOS (polycystic ovarian syndrome) 07/08/2016  . Prediabetes 07/08/2016  . Vitamin D deficiency 07/08/2016  . Headache, migraine 07/14/2015    Past Surgical History:  Procedure Laterality Date  . INCISE AND DRAIN ABCESS Right 2011   Posterior Thigh  . sebaceous cyst removal  07/2017   Back of neck  . WISDOM TOOTH EXTRACTION      OB History    Gravida  1   Para  1   Term  1   Preterm      AB      Living  1     SAB      TAB      Ectopic      Multiple  0   Live Births  1            Home Medications    Prior to Admission medications   Medication Sig Start Date End Date Taking? Authorizing Provider  albuterol (PROVENTIL HFA;VENTOLIN HFA) 108 (90 Base) MCG/ACT inhaler Inhale 2 puffs into the lungs every 6 (six) hours as needed for wheezing or shortness of breath.   Yes [provider]  busPIRone (BUSPAR) 10 MG tablet Take 1 tablet (10 mg total) by mouth 2 (two) times daily. 01/16/19  Yes Conard NovakJackson, Stephen D, MD  ferrous sulfate (FERROUSUL) 325 (65 FE) MG tablet Take 1 tablet (325 mg total) by mouth 2 (two) times daily. 05/30/18  Yes Schuman, Christanna R, MD  Loratadine (CLARITIN PO) Take 1 tablet by mouth daily.   Yes [provider]  Melatonin 5 MG CAPS Take 5 mg by mouth at bedtime.   Yes [provider]  metFORMIN (GLUCOPHAGE) 500 MG tablet Take 1 tab PO QAM x 7 days, increase to 1 tab BID x 7 days, then 2 tabs QAM and 1 tab QPM x 7, then 2 tabs BID 11/02/18  Yes Conard NovakJackson, Stephen D, MD  norethindrone (MICRONOR,CAMILA,ERRIN) 0.35 MG tablet Take 1  tablet (0.35 mg total) by mouth daily. Start in 2 weeks 07/31/18  Yes Nadara Mustard, MD  sertraline (ZOLOFT) 100 MG tablet Take 1.5 tablets (150 mg total) by mouth daily. 01/16/19  Yes Conard Novak, MD  topiramate (TOPAMAX) 25 MG tablet Take 1-2 tablets (25-50 mg total) by mouth at bedtime. 12/14/18  Yes Reubin Milan, MD  SUMAtriptan (IMITREX) 100 MG tablet Take 1 tablet (100 mg total) by mouth every 2 (two) hours as needed for migraine. May repeat in 2 hours if headache persists or recurs. 11/21/18   Reubin Milan, MD    Family History Family History  Problem Relation Age of Onset  . Osteoporosis Mother   . Alcohol abuse Father   . Drug abuse Father   . Alcohol abuse Maternal Aunt   . Bipolar disorder Maternal Aunt   . Diabetes Maternal Aunt   . Breast cancer  Maternal Aunt   . Depression Paternal Grandmother   . COPD Paternal Grandmother   . Diabetes Maternal Uncle   . Diabetes Maternal Grandmother     Social History Social History   Tobacco Use  . Smoking status: Never Smoker  . Smokeless tobacco: Never Used  Substance Use Topics  . Alcohol use: Not Currently    Alcohol/week: 0.0 standard drinks    Frequency: Never    Comment: rarely  . Drug use: No     Allergies   Shellfish allergy; Amoxicillin; and Lidoderm [lidocaine]   Review of Systems Review of Systems  Constitutional: Negative for fever.  HENT: Positive for postnasal drip and sore throat.    Physical Exam Triage Vital Signs ED Triage Vitals  Enc Vitals Group     BP 01/16/19 0918 120/88     Pulse Rate 01/16/19 0918 86     Resp 01/16/19 0918 16     Temp 01/16/19 0918 98 F (36.7 C)     Temp Source 01/16/19 0918 Oral     SpO2 01/16/19 0918 98 %     Weight 01/16/19 0915 212 lb (96.2 kg)     Height 01/16/19 0915 5\' 8"  (1.727 m)     Head Circumference --      Peak Flow --      Pain Score 01/16/19 0915 4     Pain Loc --      Pain Edu? --      Excl. in GC? --    Updated Vital Signs BP 120/88 (BP Location: Left Arm)   Pulse 86   Temp 98 F (36.7 C) (Oral)   Resp 16   Ht 5\' 8"  (1.727 m)   Wt 96.2 kg   SpO2 98%   Breastfeeding No   BMI 32.23 kg/m   Visual Acuity Right Eye Distance:   Left Eye Distance:   Bilateral Distance:    Right Eye Near:   Left Eye Near:    Bilateral Near:     Physical Exam Vitals signs and nursing note reviewed.  Constitutional:      General: She is not in acute distress.    Appearance: Normal appearance.  HENT:     Head: Normocephalic and atraumatic.     Nose: Nose normal.     Mouth/Throat:     Comments: Oropharynx with mild erythema.  Eyes:     General:        Right eye: No discharge.        Left eye: No discharge.     Conjunctiva/sclera: Conjunctivae normal.  Neck:  Musculoskeletal: Neck supple.    Cardiovascular:     Rate and Rhythm: Normal rate and regular rhythm.  Pulmonary:     Effort: Pulmonary effort is normal.     Breath sounds: Normal breath sounds. No wheezing, rhonchi or rales.  Lymphadenopathy:     Cervical: No cervical adenopathy.  Neurological:     Mental Status: She is alert.  Psychiatric:        Mood and Affect: Mood normal.        Behavior: Behavior normal.    UC Treatments / Results  Labs (all labs ordered are listed, but only abnormal results are displayed) Labs Reviewed  RAPID STREP SCREEN ([facility] ONLY)  RAPID INFLUENZA A&B ANTIGENS ([facility] ONLY)  CULTURE, GROUP A STREP [facility])    EKG None  Radiology No results found.  Procedures Procedures (including critical care time)  Medications Ordered in UC Medications - No data to display  Initial Impression / Assessment and Plan / UC Course  I have reviewed the triage vital signs and the nursing notes.  Pertinent labs & imaging results that were available during my care of the patient were reviewed by me and considered in my medical decision making (see chart for details).    28 year old female presents with viral pharyngitis. Strep negative. Ibuprofen as needed. Rest, fluids.   Final Clinical Impressions(s) / UC Diagnoses   Final diagnoses:  Viral pharyngitis     Discharge Instructions     Strep negative.  Rest. Fluids.  Ibuprofen as needed.  Take care  Dr. Adriana Simasook    ED Prescriptions    None     Controlled Substance Prescriptions [place] Controlled Substance Registry consulted? Not Applicable   Tommie SamsCook, Keelin Neville G, DO 01/16/19 1045

## 2019-01-16 NOTE — Discharge Instructions (Signed)
Strep negative.   Rest. Fluids.  Ibuprofen as needed.  Take care  Dr. Shriya Aker  

## 2019-01-16 NOTE — Progress Notes (Signed)
Obstetrics & Gynecology Office Visit   Chief Complaint  Patient presents with  . medication follow up  Postpartum depression and anxiety  History of Present Illness: 28 y.o. G1P1001 who presents in follow up from postpartum anxiety and depression. At her last visit she reported worse anxiety. Her GAD 7 was 17, her PHQ-9 was 21.  She was started on Buspar and her dose was slowly titrated to 10 mg bid.  She states that she feels better. Her anxiety is a lot better.  Her depression isn't where she would like. She has had no major side effects from the buspirone.   Today: GAD-7: 12 PHQ-9: 14   Past Medical History:  Diagnosis Date  . Anemia in pregnancy 05/30/2018  . Asthma   . Depression   . Gestational diabetes   . Gestational diabetes 06/11/2018  . Gestational diabetes mellitus (GDM) affecting third pregnancy 07/29/2018  . Headache, migraine 07/14/2015  . Hyperemesis 06/05/2018  . Infertility, anovulation 03/10/2017  . Infertility, anovulation 03/10/2017  . Nausea/vomiting in pregnancy 01/17/2018  . Obesity   . PCOS (polycystic ovarian syndrome) 07/08/2016  . Prediabetes 07/08/2016   Hgb 5.8  04/2015   . Vitamin D deficiency 07/08/2016   18.7 in 04/2015     Past Surgical History:  Procedure Laterality Date  . INCISE AND DRAIN ABCESS Right 2011   Posterior Thigh  . sebaceous cyst removal  07/2017   Back of neck  . WISDOM TOOTH EXTRACTION      Gynecologic History: No LMP recorded.  Obstetric History: G1P1001  Family History  Problem Relation Age of Onset  . Osteoporosis Mother   . Alcohol abuse Father   . Drug abuse Father   . Alcohol abuse Maternal Aunt   . Bipolar disorder Maternal Aunt   . Diabetes Maternal Aunt   . Breast cancer Maternal Aunt   . Depression Paternal Grandmother   . COPD Paternal Grandmother   . Diabetes Maternal Uncle   . Diabetes Maternal Grandmother     Social History   Socioeconomic History  . Marital status: Married    Spouse name: Not on  file  . Number of children: Not on file  . Years of education: Not on file  . Highest education level: Not on file  Occupational History  . Not on file  Social Needs  . Financial resource strain: Not on file  . Food insecurity:    Worry: Not on file    Inability: Not on file  . Transportation needs:    Medical: Not on file    Non-medical: Not on file  Tobacco Use  . Smoking status: Never Smoker  . Smokeless tobacco: Never Used  Substance and Sexual Activity  . Alcohol use: Not Currently    Alcohol/week: 0.0 standard drinks    Frequency: Never    Comment: rarely  . Drug use: No  . Sexual activity: Yes    Birth control/protection: None  Lifestyle  . Physical activity:    Days per week: Not on file    Minutes per session: Not on file  . Stress: Not on file  Relationships  . Social connections:    Talks on phone: Not on file    Gets together: Not on file    Attends religious service: Not on file    Active member of club or organization: Not on file    Attends meetings of clubs or organizations: Not on file    Relationship status: Not on file  .  Intimate partner violence:    Fear of current or ex partner: Not on file    Emotionally abused: Not on file    Physically abused: Not on file    Forced sexual activity: Not on file  Other Topics Concern  . Not on file  Social History Narrative  . Not on file    Allergies  Allergen Reactions  . Shellfish Allergy Nausea And Vomiting  . Amoxicillin Rash  . Lidoderm [Lidocaine] Rash    Prior to Admission medications   Medication Sig Start Date End Date Taking? Authorizing Provider  albuterol (PROVENTIL HFA;VENTOLIN HFA) 108 (90 Base) MCG/ACT inhaler Inhale 2 puffs into the lungs every 6 (six) hours as needed for wheezing or shortness of breath.   Yes [provider]  busPIRone (BUSPAR) 5 MG tablet Take 2 tablets (10 mg total) by mouth 2 (two) times daily. Take 1 tab PO BID x 7 days, then 2 tabs PO QAM and 1 tab PO  QPM, then 2 tabs PO BID 12/08/18  Yes Nadara Mustard, MD  ferrous sulfate (FERROUSUL) 325 (65 FE) MG tablet Take 1 tablet (325 mg total) by mouth 2 (two) times daily. 05/30/18  Yes Schuman, Christanna R, MD  Loratadine (CLARITIN PO) Take 1 tablet by mouth daily.   Yes [provider]  metFORMIN (GLUCOPHAGE) 500 MG tablet Take 1 tab PO QAM x 7 days, increase to 1 tab BID x 7 days, then 2 tabs QAM and 1 tab QPM x 7, then 2 tabs BID 11/02/18  Yes Conard Novak, MD  norethindrone (MICRONOR,CAMILA,ERRIN) 0.35 MG tablet Take 1 tablet (0.35 mg total) by mouth daily. Start in 2 weeks 07/31/18  Yes Nadara Mustard, MD  sertraline (ZOLOFT) 100 MG tablet Take 1 tablet (100 mg total) by mouth daily. 12/05/18  Yes Conard Novak, MD  SUMAtriptan (IMITREX) 100 MG tablet Take 1 tablet (100 mg total) by mouth every 2 (two) hours as needed for migraine. May repeat in 2 hours if headache persists or recurs. 11/21/18  Yes Reubin Milan, MD  topiramate (TOPAMAX) 25 MG tablet Take 1-2 tablets (25-50 mg total) by mouth at bedtime. 12/14/18  Yes Reubin Milan, MD    Review of Systems  Constitutional: Negative.   HENT: Negative.   Eyes: Negative.   Respiratory: Negative.   Cardiovascular: Negative.   Gastrointestinal: Negative.   Genitourinary: Negative.   Musculoskeletal: Negative.   Skin: Negative.   Neurological: Negative.   Psychiatric/Behavioral: Positive for depression. Negative for hallucinations, memory loss, substance abuse and suicidal ideas. The patient is nervous/anxious. The patient does not have insomnia.      Physical Exam BP (!) 150/71 (BP Location: Left Arm, Patient Position: Sitting, Cuff Size: Large)   Pulse 88   Wt 211 lb (95.7 kg)   Breastfeeding No   BMI 32.08 kg/m  No LMP recorded. Physical Exam Constitutional:      General: She is not in acute distress.    Appearance: Normal appearance.  HENT:     Head: Normocephalic and atraumatic.  Eyes:     General:  No scleral icterus.    Conjunctiva/sclera: Conjunctivae normal.  Neurological:     General: No focal deficit present.     Mental Status: She is alert and oriented to person, place, and time.     Cranial Nerves: No cranial nerve deficit.  Psychiatric:        Mood and Affect: Mood normal.  Behavior: Behavior normal.        Judgment: Judgment normal.    Assessment: 28 y.o. 681P1001 female here for  1. Anxiety   2. Recurrent major depressive disorder, in partial remission (HCC)   3. Postpartum depression      Plan: Problem List Items Addressed This Visit      Other   Major depression in partial remission (HCC)   Relevant Medications   busPIRone (BUSPAR) 10 MG tablet   sertraline (ZOLOFT) 100 MG tablet   Anxiety - Primary   Relevant Medications   busPIRone (BUSPAR) 10 MG tablet   sertraline (ZOLOFT) 100 MG tablet    Other Visit Diagnoses    Postpartum depression       Relevant Medications   busPIRone (BUSPAR) 10 MG tablet   sertraline (ZOLOFT) 100 MG tablet     15 minutes spent in face to face discussion with > 50% spent in counseling,management, and coordination of care of her anxiety and postpartum depression.   Thomasene MohairStephen Syriana Croslin, MD 01/16/2019 8:54 AM

## 2019-01-19 LAB — CULTURE, GROUP A STREP (THRC)

## 2019-03-28 NOTE — Telephone Encounter (Signed)
Patient is schedule 04/03/19 with SDJ for phone visit

## 2019-04-03 ENCOUNTER — Ambulatory Visit (INDEPENDENT_AMBULATORY_CARE_PROVIDER_SITE_OTHER): Payer: BC Managed Care – PPO | Admitting: Obstetrics and Gynecology

## 2019-04-03 ENCOUNTER — Other Ambulatory Visit: Payer: Self-pay

## 2019-04-03 ENCOUNTER — Encounter: Payer: Self-pay | Admitting: Obstetrics and Gynecology

## 2019-04-03 DIAGNOSIS — F3341 Major depressive disorder, recurrent, in partial remission: Secondary | ICD-10-CM | POA: Diagnosis not present

## 2019-04-03 DIAGNOSIS — O99345 Other mental disorders complicating the puerperium: Secondary | ICD-10-CM

## 2019-04-03 DIAGNOSIS — F419 Anxiety disorder, unspecified: Secondary | ICD-10-CM

## 2019-04-03 DIAGNOSIS — F53 Postpartum depression: Secondary | ICD-10-CM | POA: Diagnosis not present

## 2019-04-03 MED ORDER — BUSPIRONE HCL 10 MG PO TABS: 10.0000 mg | ORAL_TABLET | Freq: Two times a day (BID) | ORAL | 1 refills | Status: DC

## 2019-04-03 MED ORDER — SERTRALINE HCL 100 MG PO TABS: 150.0000 mg | ORAL_TABLET | Freq: Every day | ORAL | 1 refills | Status: DC

## 2019-04-03 NOTE — Progress Notes (Signed)
.  crsVirtual Visit via Telephone Note  I connected with Brianna Marshall on 04/03/19 at  2:30 PM EDT by telephone and verified that I am speaking with the correct person using two identifiers.   I discussed the limitations, risks, security and privacy concerns of performing an evaluation and management service by telephone and the availability of in person appointments. I also discussed with the patient that there may be a patient responsible charge related to this service. The patient expressed understanding and agreed to proceed.  She was at home and I was in my office.  History of Present Illness: 28 y.o. G33P1001 female with a history of anxiety and depression. She had been well on her combination of zoloft and buspar until she ran out of the medication (Buspar) last Wednesday.  She notes more anxiety. She denies SI/HI.  She has no side effects from the medication.    Observations/Objective: No exam today, due to telephone eVisit due to [facility] virus restriction on elective visits and procedures.  Prior visits reviewed along with ultrasounds/labs as indicated.  GAD7: 17 PHQ9: 19 (0 on #10) (see prior notes for baseline)  Assessment and Plan: 1. Anxiety   2. Recurrent major depressive disorder, in partial remission (HCC)   3. Postpartum depression    Refilled rx for current medications (Buspar and Zoloft). Will continue to monitor through MyChart.    Follow Up Instructions:  Follow up within 6 months to evaluate need for ongoing medication.   I discussed the assessment and treatment plan with the patient. The patient was provided an opportunity to ask questions and all were answered. The patient agreed with the plan and demonstrated an understanding of the instructions.   The patient was advised to call back or seek an in-person evaluation if the symptoms worsen or if the condition fails to improve as anticipated.  I provided 12 minutes of non-face-to-face time during this  encounter.  Thomasene Mohair, MD, [facility], [facility] 04/03/2019 3:05 PM

## 2019-06-03 ENCOUNTER — Other Ambulatory Visit: Payer: Self-pay | Admitting: Internal Medicine

## 2019-06-03 ENCOUNTER — Encounter: Payer: Self-pay | Admitting: Internal Medicine

## 2019-06-03 DIAGNOSIS — G43009 Migraine without aura, not intractable, without status migrainosus: Secondary | ICD-10-CM

## 2019-06-03 MED ORDER — SUMATRIPTAN SUCCINATE 100 MG PO TABS: 100.0000 mg | ORAL_TABLET | ORAL | 2 refills | Status: DC | PRN

## 2019-06-03 MED ORDER — TOPIRAMATE 25 MG PO TABS: 25.0000 mg | ORAL_TABLET | Freq: Every day | ORAL | 2 refills | Status: DC

## 2019-07-12 ENCOUNTER — Telehealth: Payer: Self-pay

## 2019-07-12 NOTE — Telephone Encounter (Signed)
Pt aware.  Tx'd to [facility] for scheduling c SDJ after his vacation.

## 2019-07-12 NOTE — Telephone Encounter (Signed)
Pt calling; started period last night; is having intense hot flashes; never happened before.  Does she need to be seen or is it worth noting?  [phone]

## 2019-07-22 ENCOUNTER — Other Ambulatory Visit: Payer: Self-pay | Admitting: Obstetrics & Gynecology

## 2019-07-25 ENCOUNTER — Other Ambulatory Visit: Payer: Self-pay | Admitting: Obstetrics and Gynecology

## 2019-07-25 DIAGNOSIS — Z3041 Encounter for surveillance of contraceptive pills: Secondary | ICD-10-CM

## 2019-07-25 MED ORDER — NORETHINDRONE 0.35 MG PO TABS: 1.0000 | ORAL_TABLET | Freq: Every day | ORAL | 3 refills | Status: DC

## 2019-07-29 ENCOUNTER — Ambulatory Visit (INDEPENDENT_AMBULATORY_CARE_PROVIDER_SITE_OTHER): Payer: BC Managed Care – PPO | Admitting: Obstetrics and Gynecology

## 2019-07-29 ENCOUNTER — Other Ambulatory Visit: Payer: Self-pay

## 2019-07-29 ENCOUNTER — Encounter: Payer: Self-pay | Admitting: Obstetrics and Gynecology

## 2019-07-29 DIAGNOSIS — R102 Pelvic and perineal pain: Secondary | ICD-10-CM | POA: Diagnosis not present

## 2019-07-29 DIAGNOSIS — G8929 Other chronic pain: Secondary | ICD-10-CM | POA: Diagnosis not present

## 2019-07-29 DIAGNOSIS — R232 Flushing: Secondary | ICD-10-CM | POA: Diagnosis not present

## 2019-07-29 NOTE — Telephone Encounter (Signed)
-----   Message from Will Bonnet, MD sent at 07/29/2019  8:48 AM EDT ----- Regarding: Schedule follow up Please call patient and schedule u/s and follow up with me. Thanks, Prentice Docker, MD

## 2019-07-29 NOTE — Telephone Encounter (Signed)
Message left for patient to call back and schedule appointment. 

## 2019-07-29 NOTE — Progress Notes (Signed)
Virtual Visit via Telephone Note  I connected with Brianna Marshall on 07/29/19 at  8:10 AM EDT by telephone and verified that I am speaking with the correct person using two identifiers.   I discussed the limitations, risks, security and privacy concerns of performing an evaluation and management service by telephone and the availability of in person appointments. I also discussed with the patient that there may be a patient responsible charge related to this service. The patient expressed understanding and agreed to proceed.  The patient was at the veterinarian office I spoke with the patient from my  office The names of people involved in this encounter were: Brianna Marshall and Prentice Docker, MD.   History of Present Illness: 28 y.o. G40P1001 female who presents via telephone appointment.   Her last periods (Started 7/24) and she had severe hot flashes throughout.    She has a history of pelvic pain that was fairly infrequent.  More recently she has had pelvic pain that is stabbing that started a couple of months now.  She relies on a TENS unit to be comfortable. The pain is present every day to varying degrees. It was definitely worse around the time of her period.  She did not have a period in June.  She had her last period (prior to July) in May (5/18).  The pain began the beginning of May/end of April.  The pain is located on her right side (it is worse on that side).  It is hard to pinpoint an exact area.  She can feel the pain in her back, also. She feels like the pain goes all the way down to her vagina (radiates).  At its worst, the pain is rated a 7-8/10.  Aggravating factors: menstruation, intercourse, and anything that puts pressure on her pelvic area (her child crawling on her).  Exercise doesn't affect the pain.  Alleviating factors: TENS unit helps a lot.  She doesn't like to take pain medication.  Associated symptoms: diarrhea and back pain.  She has a history of this  sort of pain.  Usually the pain would only come while she was menstruating.  She never had sex until she got married.  She says that sex has never been particularly comfortable.  During this particular flare up, her pain has been worse.   She was being evaluated a few years ago because she thought she might be having a cyst burst.  The testing came back negative at that time.  She is currently using norethindrone form of contraception.  She has had no change in her medication recently.  She had a hemorrhoid since pregnancy. But, no other blood in stool.  She denies blood in her urine.  She has been told she had a "nodule" on internal exam when she saw [facility].   Observations/Objective: Physical Exam could not be performed. Because of the COVID-19 outbreak this visit was performed over the phone and not in person.   Assessment and Plan:   ICD-10-CM   1. Chronic pelvic pain in female  R10.2    G89.29   2. Hot flashes  R23.2     Follow Up Instructions: Schedule a pelvic ultrasound and follow up to determine next steps.   I discussed the assessment and treatment plan with the patient. The patient was provided an opportunity to ask questions and all were answered. The patient agreed with the plan and demonstrated an understanding of the instructions.   The patient was  advised to call back or seek an in-person evaluation if the symptoms worsen or if the condition fails to improve as anticipated.  I provided 25:42 minutes of non-face-to-face time during this encounter.  Thomasene MohairStephen Masyn Rostro, MD  [facility], [facility] 07/29/2019 8:19 AM

## 2019-08-09 ENCOUNTER — Encounter: Payer: Self-pay | Admitting: Obstetrics and Gynecology

## 2019-08-09 ENCOUNTER — Ambulatory Visit (INDEPENDENT_AMBULATORY_CARE_PROVIDER_SITE_OTHER): Payer: BC Managed Care – PPO | Admitting: Obstetrics and Gynecology

## 2019-08-09 ENCOUNTER — Other Ambulatory Visit: Payer: Self-pay

## 2019-08-09 ENCOUNTER — Ambulatory Visit (INDEPENDENT_AMBULATORY_CARE_PROVIDER_SITE_OTHER): Payer: BC Managed Care – PPO

## 2019-08-09 VITALS — BP 124/76 | Wt 224.0 lb

## 2019-08-09 DIAGNOSIS — N83201 Unspecified ovarian cyst, right side: Secondary | ICD-10-CM

## 2019-08-09 DIAGNOSIS — R232 Flushing: Secondary | ICD-10-CM | POA: Diagnosis not present

## 2019-08-09 DIAGNOSIS — R102 Pelvic and perineal pain: Secondary | ICD-10-CM

## 2019-08-09 DIAGNOSIS — G8929 Other chronic pain: Secondary | ICD-10-CM | POA: Diagnosis not present

## 2019-08-09 MED ORDER — NORETHINDRONE ACETATE 5 MG PO TABS: 5.0000 mg | ORAL_TABLET | Freq: Every day | ORAL | 2 refills | Status: DC

## 2019-08-09 NOTE — Progress Notes (Signed)
Gynecology Ultrasound Follow Up   Chief Complaint  Patient presents with   Follow-up  U/S follow up for pelvic pain   History of Present Illness: Patient is a 28 y.o. female who presents today for ultrasound evaluation of chronic pelvic pain.  Ultrasound demonstrates the following findings Adnexa: right ovary with complex cyst measuring 1.9 x 2.6 x 2.7 cm. There is no blood flow within the cyst.  Uterus: anteverted with endometrial stripe  6.2 mm Additional: no other findings  Past Medical History:  Diagnosis Date   Anemia in pregnancy 05/30/2018   Asthma    Depression    Gestational diabetes    Gestational diabetes 06/11/2018   Gestational diabetes mellitus (GDM) affecting third pregnancy 07/29/2018   Headache, migraine 07/14/2015   Hyperemesis 06/05/2018   Infertility, anovulation 03/10/2017   Infertility, anovulation 03/10/2017   Nausea/vomiting in pregnancy 01/17/2018   Obesity    PCOS (polycystic ovarian syndrome) 07/08/2016   Prediabetes 07/08/2016   Hgb 5.8  04/2015    Vitamin D deficiency 07/08/2016   18.7 in 04/2015     Past Surgical History:  Procedure Laterality Date   INCISE AND DRAIN ABCESS Right 2011   Posterior Thigh   sebaceous cyst removal  07/2017   Back of neck   WISDOM TOOTH EXTRACTION      Family History  Problem Relation Age of Onset   Osteoporosis Mother    Alcohol abuse Father    Drug abuse Father    Alcohol abuse Maternal Aunt    Bipolar disorder Maternal Aunt    Diabetes Maternal Aunt    Breast cancer Maternal Aunt    Depression Paternal Grandmother    COPD Paternal Grandmother    Diabetes Maternal Uncle    Diabetes Maternal Grandmother     Social History   Socioeconomic History   Marital status: Married    Spouse name: Not on file   Number of children: Not on file   Years of education: Not on file   Highest education level: Not on file  Occupational History   Not on file  Social Needs    Financial resource strain: Not on file   Food insecurity    Worry: Not on file    Inability: Not on file   Transportation needs    Medical: Not on file    Non-medical: Not on file  Tobacco Use   Smoking status: Never Smoker   Smokeless tobacco: Never Used  Substance and Sexual Activity   Alcohol use: Not Currently    Alcohol/week: 0.0 standard drinks    Frequency: Never    Comment: rarely   Drug use: No   Sexual activity: Yes    Birth control/protection: None  Lifestyle   Physical activity    Days per week: Not on file    Minutes per session: Not on file   Stress: Not on file  Relationships   Social connections    Talks on phone: Not on file    Gets together: Not on file    Attends religious service: Not on file    Active member of club or organization: Not on file    Attends meetings of clubs or organizations: Not on file    Relationship status: Not on file   Intimate partner violence    Fear of current or ex partner: Not on file    Emotionally abused: Not on file    Physically abused: Not on file    Forced sexual activity: Not  on file  Other Topics Concern   Not on file  Social History Narrative   Not on file    Allergies  Allergen Reactions   Shellfish Allergy Nausea And Vomiting   Amoxicillin Rash   Lidoderm [Lidocaine] Rash    Prior to Admission medications   Medication Sig Start Date End Date Taking? Authorizing Provider  albuterol (PROVENTIL HFA;VENTOLIN HFA) 108 (90 Base) MCG/ACT inhaler Inhale 2 puffs into the lungs every 6 (six) hours as needed for wheezing or shortness of breath.    [provider]  busPIRone (BUSPAR) 10 MG tablet Take 1 tablet (10 mg total) by mouth 2 (two) times daily. 04/03/19   Conard NovakJackson, Keshon Markovitz D, MD  ferrous sulfate (FERROUSUL) 325 (65 FE) MG tablet Take 1 tablet (325 mg total) by mouth 2 (two) times daily. 05/30/18   Schuman, Jaquelyn Bitterhristanna R, MD  Loratadine (CLARITIN PO) Take 1 tablet by mouth daily.     [provider]  Melatonin 5 MG CAPS Take 5 mg by mouth at bedtime.    [provider]  metFORMIN (GLUCOPHAGE) 500 MG tablet Take 1 tab PO QAM x 7 days, increase to 1 tab BID x 7 days, then 2 tabs QAM and 1 tab QPM x 7, then 2 tabs BID 11/02/18   Conard NovakJackson, Harrol Novello D, MD  norethindrone (NORLYDA) 0.35 MG tablet Take 1 tablet (0.35 mg total) by mouth daily. 07/25/19   Conard NovakJackson, Burgundy Matuszak D, MD  sertraline (ZOLOFT) 100 MG tablet Take 1.5 tablets (150 mg total) by mouth daily. 04/03/19   Conard NovakJackson, Irie Dowson D, MD  SUMAtriptan (IMITREX) 100 MG tablet Take 1 tablet (100 mg total) by mouth every 2 (two) hours as needed for migraine. May repeat in 2 hours if headache persists or recurs. 06/03/19   Reubin MilanBerglund, Laura H, MD  topiramate (TOPAMAX) 25 MG tablet Take 1-2 tablets (25-50 mg total) by mouth at bedtime. 06/03/19   Reubin MilanBerglund, Laura H, MD    Physical Exam BP 124/76    Wt 224 lb (101.6 kg)    LMP 07/14/2019    BMI 34.06 kg/m    General: NAD HEENT: normocephalic, anicteric Pulmonary: No increased work of breathing Extremities: no edema, erythema, or tenderness Neurologic: Grossly intact, normal gait Psychiatric: mood appropriate, affect full  Imaging Results [place] Pelvis Transvaginal Non-ob (tv Only)  Result Date: 08/09/2019 Patient Name: Robyne AskewChristine S Ferrera DOB: 07/28/1991 MRN: [id number] ULTRASOUND REPORT Location: [facility] Date of Service: 08/09/2019 Indications:Pelvic Pain Findings: The uterus is anteverted and measures 7.4 x 5.1 x 3.6 cm. Echo texture is homogenous without evidence of focal masses. The Endometrium measures 6.2 mm. Right Ovary: measures 3.4 x 3.0 x 2.3 cm. It is not normal in appearance.  Small, complex cyst noted. Measures: 1.9 x 2.6 x 2.7 cm. Left Ovary: measures 2.8 x 2.1 x 1.9 cm. It is normal in appearance. Survey of the adnexa demonstrates no adnexal masses. There is no free fluid in the cul de sac. Impression: 1. Normal appearing uterus. 2. There is a complex cyst in  the right ovary. No blood flow within. 3. Normal left ovary. Recommendations: 1.Clinical correlation with the patient's History and Physical Exam. 2. Follow up on right ovarian cyst, as clinically indicated. Deanna ArtisElyse S Fairbanks, RT The ultrasound images and findings were reviewed by me and I agree with the above report. Thomasene MohairStephen Cameo Schmiesing, MD, [facility], [facility] 08/09/2019 9:03 AM       Assessment: 28 y.o. G1P1001  1. Chronic pelvic pain in  female   2. Right ovarian cyst      Plan: Problem List Items Addressed This Visit    None    Visit Diagnoses    Chronic pelvic pain in female    -  Primary   Relevant Medications   norethindrone (AYGESTIN) 5 MG tablet   Right ovarian cyst         Long discussion regarding potential causes of pain.  We also discussed the cyst found on ultrasound in relationship to this pain.  We discussed possible endometriosis, as well.  Discussed methods of diagnosing endometriosis, including surgery or treating presumptively.  After a long discussion of the various presumptive treatment options, mutual decision made to treat with norethindrone 5 mg daily.  We will have her follow-up in 1 month to assess symptoms.  Unless her symptoms change dramatically, we will not reimage her cyst as it should resolve on its own.  20 minutes spent in face to face discussion with > 50% spent in counseling,management, and coordination of care of her chronic pelvic pain in female and right ovarian cyst.   Thomasene MohairStephen Ananya Mccleese, MD, [facility] FrederickFACOG [facility], [facility] 08/09/2019 9:39 AM

## 2019-09-06 ENCOUNTER — Encounter: Payer: Self-pay | Admitting: Obstetrics and Gynecology

## 2019-09-06 ENCOUNTER — Ambulatory Visit (INDEPENDENT_AMBULATORY_CARE_PROVIDER_SITE_OTHER): Payer: BC Managed Care – PPO | Admitting: Obstetrics and Gynecology

## 2019-09-06 ENCOUNTER — Other Ambulatory Visit: Payer: Self-pay

## 2019-09-06 DIAGNOSIS — R102 Pelvic and perineal pain: Secondary | ICD-10-CM | POA: Diagnosis not present

## 2019-09-06 DIAGNOSIS — G8929 Other chronic pain: Secondary | ICD-10-CM

## 2019-09-06 MED ORDER — NORETHINDRONE ACETATE 5 MG PO TABS: 15.0000 mg | ORAL_TABLET | Freq: Every day | ORAL | 2 refills | Status: DC

## 2019-09-06 NOTE — Progress Notes (Signed)
   Virtual Visit via Telephone Note  I connected with Brianna Marshall on 09/06/19 at 10:10 AM EDT by telephone and verified that I am speaking with the correct person using two identifiers.   I discussed the limitations, risks, security and privacy concerns of performing an evaluation and management service by telephone and the availability of in person appointments. I also discussed with the patient that there may be a patient responsible charge related to this service. The patient expressed understanding and agreed to proceed.  The patient was at home I spoke with the patient from my office The names of people involved in this encounter were: Brianna Marshall and Prentice Docker, MD.   History of Present Illness: 28 y.o. G44P1001 female who presents in follow up from starting norethindrone 5 mg for pelvic pain. She has been having irregular bleeding.  She had a period 1.5 weeks ago and 2 days ago she had some bleeding. From a pain standpoint, she believes that the pain is better overall.  She is having no side effects from the medication.    Observations/Objective: Physical Exam could not be performed. Because of the COVID-19 outbreak this visit was performed over the phone and not in person.   Assessment and Plan: 28 y.o. G69P1001 female with pelvic pain doing fairly well on the medication.  Instructed her to increase dosing to 2 pills per day if the day is bad.   Follow Up Instructions: Follow up in 29months  rx provided for potential need for increased dose.   I discussed the assessment and treatment plan with the patient. The patient was provided an opportunity to ask questions and all were answered. The patient agreed with the plan and demonstrated an understanding of the instructions.   The patient was advised to call back or seek an in-person evaluation if the symptoms worsen or if the condition fails to improve as anticipated.  I provided 17:27 minutes of non-face-to-face  time during this encounter.  Prentice Docker, MD  [facility], [facility] 09/06/2019 11:03 AM

## 2019-10-14 ENCOUNTER — Other Ambulatory Visit: Payer: Self-pay | Admitting: Obstetrics and Gynecology

## 2019-10-14 DIAGNOSIS — F419 Anxiety disorder, unspecified: Secondary | ICD-10-CM

## 2019-10-14 DIAGNOSIS — F3341 Major depressive disorder, recurrent, in partial remission: Secondary | ICD-10-CM

## 2019-10-14 MED ORDER — BUSPIRONE HCL 10 MG PO TABS: 10.0000 mg | ORAL_TABLET | Freq: Two times a day (BID) | ORAL | 1 refills | Status: DC

## 2019-10-21 ENCOUNTER — Other Ambulatory Visit: Payer: Self-pay | Admitting: Obstetrics & Gynecology

## 2019-10-21 DIAGNOSIS — F53 Postpartum depression: Secondary | ICD-10-CM

## 2019-10-21 DIAGNOSIS — F3341 Major depressive disorder, recurrent, in partial remission: Secondary | ICD-10-CM

## 2019-10-21 MED ORDER — SERTRALINE HCL 100 MG PO TABS: 150.0000 mg | ORAL_TABLET | Freq: Every day | ORAL | 0 refills | Status: DC

## 2019-10-21 NOTE — Telephone Encounter (Signed)
ERx one month done

## 2019-11-29 ENCOUNTER — Other Ambulatory Visit: Payer: Self-pay

## 2019-11-29 DIAGNOSIS — F3341 Major depressive disorder, recurrent, in partial remission: Secondary | ICD-10-CM

## 2019-11-29 DIAGNOSIS — F53 Postpartum depression: Secondary | ICD-10-CM

## 2019-11-29 MED ORDER — SERTRALINE HCL 100 MG PO TABS: 150.0000 mg | ORAL_TABLET | Freq: Every day | ORAL | 2 refills | Status: DC

## 2019-12-06 ENCOUNTER — Other Ambulatory Visit: Payer: Self-pay

## 2019-12-06 ENCOUNTER — Ambulatory Visit (INDEPENDENT_AMBULATORY_CARE_PROVIDER_SITE_OTHER): Payer: BC Managed Care – PPO | Admitting: Obstetrics and Gynecology

## 2019-12-06 ENCOUNTER — Encounter: Payer: Self-pay | Admitting: Obstetrics and Gynecology

## 2019-12-06 DIAGNOSIS — G8929 Other chronic pain: Secondary | ICD-10-CM

## 2019-12-06 DIAGNOSIS — R102 Pelvic and perineal pain: Secondary | ICD-10-CM | POA: Diagnosis not present

## 2019-12-06 DIAGNOSIS — F3341 Major depressive disorder, recurrent, in partial remission: Secondary | ICD-10-CM

## 2019-12-06 DIAGNOSIS — F419 Anxiety disorder, unspecified: Secondary | ICD-10-CM | POA: Diagnosis not present

## 2019-12-06 NOTE — Progress Notes (Signed)
   Virtual Visit via Telephone Note  I connected with Brianna Marshall on 12/06/19 at 10:50 AM EST by telephone and verified that I am speaking with the correct person using two identifiers.   I discussed the limitations, risks, security and privacy concerns of performing an evaluation and management service by telephone and the availability of in person appointments. I also discussed with the patient that there may be a patient responsible charge related to this service. The patient expressed understanding and agreed to proceed.  The patient was in her car, travelling.  She states that she is safe to talk while in her car. I spoke with the patient from my  office The names of people involved in this encounter were: Brianna Marshall and Prentice Docker, MD.   History of Present Illness: 28 y.o. G74P1001 female who presents via telephone for follow up of pelvic pain.  She has been started on norethindrone and at her last visit her pain was not super well controlled.  She still has episodes of "really bad pain" but nowhere nearly as bad as before.  She states she has these episodes once to twice per months.  She is taking norethindrone 5 mg. She has only taken an increase in dose one time. Overall, she states that she is satisfied with her pain control.   She is dong well on zoloft and buspar. She is doing well and is seeing a therapist and this is helping, too.  She does not need any refills.    Observations/Objective: Physical Exam could not be performed. Because of the COVID-19 outbreak this visit was performed over the phone and not in person.   Assessment and Plan: 28 y.o. G66P1001 female with chronic pelvic pain who is reasonably well controlled on norethindrone.  WIll continue with current plan of care. From a depression/anxiety standpoint, will continue with current plan of care.  Follow Up Instructions: Follow up for annual gynecologic examination sometime in the next 6-10 months  or as needed.   I discussed the assessment and treatment plan with the patient. The patient was provided an opportunity to ask questions and all were answered. The patient agreed with the plan and demonstrated an understanding of the instructions.   The patient was advised to call back or seek an in-person evaluation if the symptoms worsen or if the condition fails to improve as anticipated.  I provided 14:17 minutes of non-face-to-face time during this encounter.  Prentice Docker, MD  [facility] 12/06/2019 11:24 AM

## 2019-12-11 ENCOUNTER — Other Ambulatory Visit: Payer: Self-pay | Admitting: Obstetrics and Gynecology

## 2019-12-11 DIAGNOSIS — E8881 Metabolic syndrome: Secondary | ICD-10-CM

## 2019-12-11 DIAGNOSIS — E282 Polycystic ovarian syndrome: Secondary | ICD-10-CM

## 2019-12-11 MED ORDER — METFORMIN HCL 500 MG PO TABS: 1000.0000 mg | ORAL_TABLET | Freq: Two times a day (BID) | ORAL | 11 refills | Status: DC

## 2019-12-17 ENCOUNTER — Ambulatory Visit: Payer: BC Managed Care – PPO | Admitting: Internal Medicine

## 2019-12-17 ENCOUNTER — Other Ambulatory Visit: Payer: Self-pay

## 2019-12-17 ENCOUNTER — Encounter: Payer: Self-pay | Admitting: Internal Medicine

## 2019-12-17 VITALS — BP 121/81 | HR 98 | Temp 98.0°F | Resp 16 | Ht 68.0 in | Wt 222.0 lb

## 2019-12-17 DIAGNOSIS — G479 Sleep disorder, unspecified: Secondary | ICD-10-CM

## 2019-12-17 DIAGNOSIS — L659 Nonscarring hair loss, unspecified: Secondary | ICD-10-CM | POA: Diagnosis not present

## 2019-12-17 DIAGNOSIS — G43009 Migraine without aura, not intractable, without status migrainosus: Secondary | ICD-10-CM | POA: Insufficient documentation

## 2019-12-17 DIAGNOSIS — R5383 Other fatigue: Secondary | ICD-10-CM | POA: Insufficient documentation

## 2019-12-17 NOTE — Progress Notes (Signed)
Date:  12/17/2019   Name:  Brianna Marshall   DOB:  06/08/1991   MRN:  [id number]   Chief Complaint: Migraine (Having migraines 1 every other week now that on medication. Last one was milder than previous ones. ), Fatigue (9 Mo began around April.Marland KitchenMarland KitchenHusband has hard time waking her up and she is taking more naps and has dosed off a few times while driving and it scares her. ), Alopecia (After having baby had hair loss then it stopped and a few months later she noticed she is losing clumps of hair in shower. ), and Tremors (numbness and shaking in hands off and on since April. )  Migraine  This is a chronic problem. The problem has been gradually improving. The pain quality is similar to prior headaches. Pertinent negatives include no abdominal pain, coughing, dizziness, fever, nausea or weakness. Treatments tried: much less on topiramate. The treatment provided significant relief.  Hair loss - had some after baby was born August 2019 which resolved then started again about 6 months again it started to fall out again.  No bald patches are present.  Fatigue with daytime somnolence - sleep is not refreshing and she has fallen asleep while driving. She can take a nap at anytime.  When she wakes in the morning she feels like she has not slept at all.  Lab Results  Component Value Date   CREATININE 0.71 07/29/2018   BUN 14 07/29/2018   NA 134 (L) 07/29/2018   K 4.3 07/29/2018   CL 109 07/29/2018   CO2 19 (L) 07/29/2018   No results found for: CHOL, HDL, LDLCALC, LDLDIRECT, TRIG, CHOLHDL No results found for: TSH Lab Results  Component Value Date   HGBA1C 5.2 11/02/2018     Review of Systems  Constitutional: Positive for fatigue. Negative for chills, fever and unexpected weight change.  HENT: Negative for trouble swallowing.   Eyes: Negative for visual disturbance.  Respiratory: Negative for cough, chest tightness, shortness of breath and wheezing.   Cardiovascular: Negative for  chest pain, palpitations and leg swelling.  Gastrointestinal: Negative for abdominal pain, constipation, diarrhea and nausea.  Genitourinary: Positive for menstrual problem (possible endometriosis being treated with progesterone). Negative for dysuria.  Neurological: Positive for tremors and headaches. Negative for dizziness, weakness and light-headedness.  Psychiatric/Behavioral: Positive for sleep disturbance. Negative for dysphoric mood and hallucinations. The patient is not nervous/anxious.     Patient Active Problem List   Diagnosis Date Noted  . Migraine without aura and without status migrainosus, not intractable 12/17/2019  . Anxiety 08/13/2018  . Antepartum mild preeclampsia, third trimester 07/30/2018  . Supervision of high risk pregnancy, antepartum 01/02/2018  . Insulin resistance 01/02/2018  . Major depression in partial remission ([place]) 01/02/2018  . PCOS (polycystic ovarian syndrome) 07/08/2016  . Prediabetes 07/08/2016  . Vitamin D deficiency 07/08/2016    Allergies  Allergen Reactions  . Shellfish Allergy Nausea And Vomiting  . Amoxicillin Rash  . Lidoderm [Lidocaine] Rash    Past Surgical History:  Procedure Laterality Date  . INCISE AND DRAIN ABCESS Right 2011   Posterior Thigh  . sebaceous cyst removal  07/2017   Back of neck  . WISDOM TOOTH EXTRACTION      Social History   Tobacco Use  . Smoking status: Never Smoker  . Smokeless tobacco: Never Used  Substance Use Topics  . Alcohol use: Not Currently    Alcohol/week: 0.0 standard drinks    Comment: rarely  . Drug  use: No     Medication list has been reviewed and updated.  Current Meds  Medication Sig  . albuterol (PROVENTIL HFA;VENTOLIN HFA) 108 (90 Base) MCG/ACT inhaler Inhale 2 puffs into the lungs every 6 (six) hours as needed for wheezing or shortness of breath.  . busPIRone (BUSPAR) 10 MG tablet Take 1 tablet (10 mg total) by mouth 2 (two) times daily.  . ferrous sulfate (FERROUSUL) 325  (65 FE) MG tablet Take 1 tablet (325 mg total) by mouth 2 (two) times daily.  . Loratadine (CLARITIN PO) Take 1 tablet by mouth daily.  . Melatonin 5 MG CAPS Take 5 mg by mouth at bedtime.  . metFORMIN (GLUCOPHAGE) 500 MG tablet Take 2 tablets (1,000 mg total) by mouth 2 (two) times daily with a meal.  . norethindrone (AYGESTIN) 5 MG tablet Take 3 tablets (15 mg total) by mouth daily.  . sertraline (ZOLOFT) 100 MG tablet Take 1.5 tablets (150 mg total) by mouth daily.  . SUMAtriptan (IMITREX) 100 MG tablet Take 1 tablet (100 mg total) by mouth every 2 (two) hours as needed for migraine. May repeat in 2 hours if headache persists or recurs.  . topiramate (TOPAMAX) 25 MG tablet Take 1-2 tablets (25-50 mg total) by mouth at bedtime.    PHQ 2/9 Scores 01/16/2019 12/05/2018 11/21/2018 06/05/2018  PHQ - 2 Score 2 3 2 3   PHQ- 9 Score 14 21 17 17   Exception Documentation - - - -  Not completed - - - -    BP Readings from Last 3 Encounters:  12/17/19 121/81  08/09/19 124/76  01/16/19 120/88    Physical Exam Vitals and nursing note reviewed.  Constitutional:      General: She is not in acute distress.    Appearance: Normal appearance. She is well-developed.  HENT:     Head: Normocephalic and atraumatic.  Neck:     Vascular: No carotid bruit.  Cardiovascular:     Rate and Rhythm: Normal rate and regular rhythm.     Pulses: Normal pulses.     Heart sounds: No murmur.  Pulmonary:     Effort: Pulmonary effort is normal. No respiratory distress.     Breath sounds: Normal breath sounds. No wheezing or rhonchi.  Abdominal:     General: There is no distension.     Palpations: Abdomen is soft.     Tenderness: There is no abdominal tenderness. There is no guarding or rebound.  Musculoskeletal:     Cervical back: Normal range of motion.     Right lower leg: No edema.     Left lower leg: No edema.  Lymphadenopathy:     Cervical: No cervical adenopathy.  Skin:    General: Skin is warm and  dry.     Capillary Refill: Capillary refill takes less than 2 seconds.     Findings: No rash.  Neurological:     General: No focal deficit present.     Mental Status: She is alert and oriented to person, place, and time.  Psychiatric:        Behavior: Behavior normal.        Thought Content: Thought content normal.     Wt Readings from Last 3 Encounters:  12/17/19 222 lb (100.7 kg)  08/09/19 224 lb (101.6 kg)  01/16/19 212 lb (96.2 kg)    BP 121/81   Pulse 98   Temp 98 F (36.7 C) (Temporal)   Resp 16   Ht 5\' 8"  (1.727 m)  Wt 222 lb (100.7 kg)   SpO2 98%   BMI 33.75 kg/m   Assessment and Plan: 1. Migraine without aura and without status migrainosus, not intractable Migraine headaches are less frequent and severe on Topamax 25 mg at bedtime Intermittent migraines still responding to Imitrex PRN - Comprehensive metabolic panel  2. Alopecia Scalp is normal in appearance, there are no patchy bald or thinning areas Suspect this  Telogen effluvium or thyroid disease - TSH + free T4  3. Fatigue, unspecified type Of uncertain cause - long differential but at the top are thyroid disease, anemia and primary sleep disorder such as sleep apnea - CBC with Differential/Platelet - Hemoglobin A1c - B12  4. Sleep disorder Recommend sleep study - either in facility or at home - Nocturnal polysomnography (NPSG); Future   Partially dictated using Animal nutritionistDragon software. Any errors are unintentional.  Bari EdwardLaura Kamaron Deskins, MD [facility]  12/17/2019

## 2019-12-18 ENCOUNTER — Other Ambulatory Visit: Payer: Self-pay | Admitting: Internal Medicine

## 2019-12-18 DIAGNOSIS — R5383 Other fatigue: Secondary | ICD-10-CM

## 2019-12-18 DIAGNOSIS — G479 Sleep disorder, unspecified: Secondary | ICD-10-CM

## 2019-12-18 LAB — VITAMIN B12: Vitamin B-12: 654 pg/mL (ref 232–1245)

## 2019-12-18 LAB — CBC WITH DIFFERENTIAL/PLATELET
Basophils Absolute: 0.1 10*3/uL (ref 0.0–0.2)
Basos: 1 %
EOS (ABSOLUTE): 0.4 10*3/uL (ref 0.0–0.4)
Eos: 5 %
Hematocrit: 46 % (ref 34.0–46.6)
Hemoglobin: 15.7 g/dL (ref 11.1–15.9)
Immature Grans (Abs): 0 10*3/uL (ref 0.0–0.1)
Immature Granulocytes: 0 %
Lymphocytes Absolute: 3 10*3/uL (ref 0.7–3.1)
Lymphs: 35 %
MCH: 31 pg (ref 26.6–33.0)
MCHC: 34.1 g/dL (ref 31.5–35.7)
MCV: 91 fL (ref 79–97)
Monocytes Absolute: 0.6 10*3/uL (ref 0.1–0.9)
Monocytes: 7 %
Neutrophils Absolute: 4.5 10*3/uL (ref 1.4–7.0)
Neutrophils: 52 %
Platelets: 315 10*3/uL (ref 150–450)
RBC: 5.07 x10E6/uL (ref 3.77–5.28)
RDW: 13 % (ref 11.7–15.4)
WBC: 8.5 10*3/uL (ref 3.4–10.8)

## 2019-12-18 LAB — COMPREHENSIVE METABOLIC PANEL
ALT: 17 IU/L (ref 0–32)
AST: 16 IU/L (ref 0–40)
Albumin/Globulin Ratio: 2 (ref 1.2–2.2)
Albumin: 4.5 g/dL (ref 3.9–5.0)
Alkaline Phosphatase: 75 IU/L (ref 39–117)
BUN/Creatinine Ratio: 11 (ref 9–23)
BUN: 8 mg/dL (ref 6–20)
Bilirubin Total: 0.2 mg/dL (ref 0.0–1.2)
CO2: 20 mmol/L (ref 20–29)
Calcium: 9.8 mg/dL (ref 8.7–10.2)
Chloride: 106 mmol/L (ref 96–106)
Creatinine, Ser: 0.7 mg/dL (ref 0.57–1.00)
GFR calc Af Amer: 137 mL/min/{1.73_m2} (ref >59)
GFR calc non Af Amer: 119 mL/min/{1.73_m2} (ref >59)
Globulin, Total: 2.3 g/dL (ref 1.5–4.5)
Glucose: 96 mg/dL (ref 65–99)
Potassium: 4.4 mmol/L (ref 3.5–5.2)
Sodium: 141 mmol/L (ref 134–144)
Total Protein: 6.8 g/dL (ref 6.0–8.5)

## 2019-12-18 LAB — TSH+FREE T4
Free T4: 1.36 ng/dL (ref 0.82–1.77)
TSH: 1.53 u[IU]/mL (ref 0.450–4.500)

## 2019-12-18 LAB — HEMOGLOBIN A1C
Est. average glucose Bld gHb Est-mCnc: 108 mg/dL
Hgb A1c MFr Bld: 5.4 % (ref 4.8–5.6)

## 2019-12-31 ENCOUNTER — Other Ambulatory Visit: Payer: Self-pay | Admitting: Internal Medicine

## 2019-12-31 ENCOUNTER — Ambulatory Visit: Payer: BC Managed Care – PPO | Attending: Otolaryngology

## 2019-12-31 DIAGNOSIS — G43009 Migraine without aura, not intractable, without status migrainosus: Secondary | ICD-10-CM

## 2019-12-31 DIAGNOSIS — R0683 Snoring: Secondary | ICD-10-CM | POA: Diagnosis not present

## 2019-12-31 DIAGNOSIS — G4733 Obstructive sleep apnea (adult) (pediatric): Secondary | ICD-10-CM | POA: Diagnosis present

## 2020-01-01 ENCOUNTER — Other Ambulatory Visit: Payer: Self-pay

## 2020-01-14 ENCOUNTER — Encounter: Payer: Self-pay | Admitting: Internal Medicine

## 2020-01-15 NOTE — Telephone Encounter (Signed)
Anything you want to let the pt know about the sleep study?

## 2020-01-16 ENCOUNTER — Encounter: Payer: Self-pay | Admitting: Internal Medicine

## 2020-02-17 DIAGNOSIS — U071 COVID-19: Secondary | ICD-10-CM

## 2020-02-17 HISTORY — DX: COVID-19: U07.1

## 2020-03-08 ENCOUNTER — Encounter: Payer: Self-pay | Admitting: Internal Medicine

## 2020-03-09 ENCOUNTER — Other Ambulatory Visit: Payer: Self-pay

## 2020-03-09 DIAGNOSIS — G43009 Migraine without aura, not intractable, without status migrainosus: Secondary | ICD-10-CM

## 2020-03-09 MED ORDER — SUMATRIPTAN SUCCINATE 100 MG PO TABS: 100.0000 mg | ORAL_TABLET | ORAL | 2 refills | Status: DC | PRN

## 2020-05-08 ENCOUNTER — Encounter: Payer: Self-pay | Admitting: Obstetrics and Gynecology

## 2020-05-08 ENCOUNTER — Ambulatory Visit (INDEPENDENT_AMBULATORY_CARE_PROVIDER_SITE_OTHER): Payer: BC Managed Care – PPO | Admitting: Obstetrics and Gynecology

## 2020-05-08 ENCOUNTER — Other Ambulatory Visit: Payer: Self-pay

## 2020-05-08 VITALS — BP 122/74 | Ht 68.0 in | Wt 236.0 lb

## 2020-05-08 DIAGNOSIS — F3341 Major depressive disorder, recurrent, in partial remission: Secondary | ICD-10-CM | POA: Diagnosis not present

## 2020-05-08 DIAGNOSIS — R102 Pelvic and perineal pain: Secondary | ICD-10-CM | POA: Diagnosis not present

## 2020-05-08 DIAGNOSIS — G8929 Other chronic pain: Secondary | ICD-10-CM | POA: Diagnosis not present

## 2020-05-08 DIAGNOSIS — F419 Anxiety disorder, unspecified: Secondary | ICD-10-CM | POA: Diagnosis not present

## 2020-05-08 NOTE — Progress Notes (Signed)
Obstetrics & Gynecology Office Visit   Chief Complaint  Patient presents with  . Follow-up  Medication for pelvic pain and depression/anxiety  History of Present Illness: 29 y.o. G45P1001 female who presents in follow up for pelvic pain and depression and anxiety.  She notes that to her it seems like the medication is doing nothing, as if she were taking nothing at all.  She is currently on Zoloft 150 mg daily and Buspar 20 mg twice daily.   She used to see a psychiatrist.  She states that several years have passed since she last saw the psychiatrist.  She saw Dr. Garnetta Buddy.  She denies SI/HI.    From a pelvic pain standpoint: She just had a flare up.  Overall, her pain control has been "OK."  This has been her first flare in a couple of months.  She had COVID in March and she also had a flare around that time.  She has not had a flare since then.    Past Medical History:  Diagnosis Date  . Anemia in pregnancy 05/30/2018  . Asthma   . Depression   . Gestational diabetes   . Gestational diabetes 06/11/2018  . Gestational diabetes mellitus (GDM) affecting third pregnancy 07/29/2018  . Headache, migraine 07/14/2015  . Hyperemesis 06/05/2018  . Infertility, anovulation 03/10/2017  . Infertility, anovulation 03/10/2017  . Nausea/vomiting in pregnancy 01/17/2018  . Obesity   . PCOS (polycystic ovarian syndrome) 07/08/2016  . Prediabetes 07/08/2016   Hgb 5.8  04/2015   . Supervision of high risk pregnancy, antepartum 01/02/2018   Clinic [facility] Prenatal Labs Dating Early [place] Blood type: O/Positive/-- (01/29 1038)  Genetic Screen 1 Screen: Neg Antibody:Negative (01/29 1038) Anatomic [place] Normal, anterior placenta,  Rubella: <0.90 (01/29 1038) Varicella: Imm GTT Early: 123 Third trimester:  RPR: Non Reactive (01/29 1038)  Rhogam n/a HBsAg: Negative (01/29 1038)  TDaP vaccine        05/30/2018    Flu Shot: 09/2017 HIV: Non Reac  . Vitamin D deficiency 07/08/2016   18.7 in 04/2015     Past Surgical History:    Procedure Laterality Date  . INCISE AND DRAIN ABCESS Right 2011   Posterior Thigh  . sebaceous cyst removal  07/2017   Back of neck  . WISDOM TOOTH EXTRACTION      Gynecologic History: No LMP recorded.  Obstetric History: G1P1001  Family History  Problem Relation Age of Onset  . Osteoporosis Mother   . Alcohol abuse Father   . Drug abuse Father   . Alcohol abuse Maternal Aunt   . Bipolar disorder Maternal Aunt   . Diabetes Maternal Aunt   . Breast cancer Maternal Aunt   . Depression Paternal Grandmother   . COPD Paternal Grandmother   . Diabetes Maternal Uncle   . Diabetes Maternal Grandmother     Social History   Socioeconomic History  . Marital status: Married    Spouse name: Not on file  . Number of children: Not on file  . Years of education: Not on file  . Highest education level: Not on file  Occupational History  . Not on file  Tobacco Use  . Smoking status: Never Smoker  . Smokeless tobacco: Never Used  Substance and Sexual Activity  . Alcohol use: Not Currently    Alcohol/week: 0.0 standard drinks    Comment: rarely  . Drug use: No  . Sexual activity: Yes    Birth control/protection: None  Other Topics Concern  .  Not on file  Social History Narrative  . Not on file   Social Determinants of Health   Financial Resource Strain:   . Difficulty of Paying Living Expenses:   Food Insecurity:   . Worried About Programme researcher, broadcasting/film/video in the Last Year:   . Barista in the Last Year:   Transportation Needs:   . Freight forwarder (Medical):   Marland Kitchen Lack of Transportation (Non-Medical):   Physical Activity:   . Days of Exercise per Week:   . Minutes of Exercise per Session:   Stress:   . Feeling of Stress :   Social Connections:   . Frequency of Communication with Friends and Family:   . Frequency of Social Gatherings with Friends and Family:   . Attends Religious Services:   . Active Member of Clubs or Organizations:   . Attends Tax inspector Meetings:   Marland Kitchen Marital Status:   Intimate Partner Violence:   . Fear of Current or Ex-Partner:   . Emotionally Abused:   Marland Kitchen Physically Abused:   . Sexually Abused:     Allergies  Allergen Reactions  . Shellfish Allergy Nausea And Vomiting  . Amoxicillin Rash  . Lidoderm [Lidocaine] Rash    Prior to Admission medications   Medication Sig Start Date End Date Taking? Authorizing Provider  albuterol (PROVENTIL HFA;VENTOLIN HFA) 108 (90 Base) MCG/ACT inhaler Inhale 2 puffs into the lungs every 6 (six) hours as needed for wheezing or shortness of breath.    [provider]  busPIRone (BUSPAR) 10 MG tablet Take 1 tablet (10 mg total) by mouth 2 (two) times daily. 10/14/19   Conard Novak, MD  ferrous sulfate (FERROUSUL) 325 (65 FE) MG tablet Take 1 tablet (325 mg total) by mouth 2 (two) times daily. 05/30/18   Schuman, Jaquelyn Bitter, MD  Loratadine (CLARITIN PO) Take 1 tablet by mouth daily.    [provider]  Melatonin 5 MG CAPS Take 5 mg by mouth at bedtime.    [provider]  metFORMIN (GLUCOPHAGE) 500 MG tablet Take 2 tablets (1,000 mg total) by mouth 2 (two) times daily with a meal. 12/11/19   Conard Novak, MD  norethindrone (AYGESTIN) 5 MG tablet Take 3 tablets (15 mg total) by mouth daily. 09/06/19   Conard Novak, MD  sertraline (ZOLOFT) 100 MG tablet Take 1.5 tablets (150 mg total) by mouth daily. 11/29/19   Nadara Mustard, MD  SUMAtriptan (IMITREX) 100 MG tablet Take 1 tablet (100 mg total) by mouth every 2 (two) hours as needed for migraine. May repeat in 2 hours if headache persists or recurs. 03/09/20   Reubin Milan, MD  topiramate (TOPAMAX) 25 MG tablet TAKE 1 TO 2 TABLETS(25 TO 50 MG) BY MOUTH AT BEDTIME 12/31/19   Reubin Milan, MD    Review of Systems  Constitutional: Negative.   HENT: Negative.   Eyes: Negative.   Respiratory: Negative.   Cardiovascular: Negative.   Gastrointestinal: Positive for abdominal  pain (see HPI). Negative for blood in stool, constipation, diarrhea, heartburn, melena, nausea and vomiting.  Genitourinary: Negative.   Musculoskeletal: Negative.   Skin: Negative.   Neurological: Negative.   Psychiatric/Behavioral: Positive for depression. Negative for hallucinations, memory loss, substance abuse and suicidal ideas. The patient is nervous/anxious. The patient does not have insomnia.     Physical Exam BP 122/74   Ht 5\' 8"  (1.727 m)   Wt 236 lb (107 kg)  BMI 35.88 kg/m  No LMP recorded. Physical Exam Constitutional:      General: She is not in acute distress.    Appearance: Normal appearance.  HENT:     Head: Normocephalic and atraumatic.  Eyes:     General: No scleral icterus.    Conjunctiva/sclera: Conjunctivae normal.  Neurological:     General: No focal deficit present.     Mental Status: She is alert and oriented to person, place, and time.     Cranial Nerves: No cranial nerve deficit.  Psychiatric:        Mood and Affect: Mood normal.        Behavior: Behavior normal.        Judgment: Judgment normal.   GAD 7: 21 PHQ-9: 23 (zero on #9)  Female chaperone present for pelvic and breast  portions of the physical exam  Assessment: 29 y.o. G46P1001 female here for  1. Recurrent major depressive disorder, in partial remission ([place])   2. Anxiety   3. Chronic pelvic pain in female      Plan: Problem List Items Addressed This Visit      Other   Major depression in partial remission ([place]) - Primary   Relevant Orders   Ambulatory referral to Psychiatry   Anxiety   Relevant Orders   Ambulatory referral to Psychiatry    Other Visit Diagnoses    Chronic pelvic pain in female         Depression and anxiety: referral to psychiatry. She is on two agents and getting essentially no benefit.  Continue with current dose.  However, we discussed that she has reached the limit of my comfort in treatment.  She voiced understanding and agreement for the  referral.  Chronic pelvic pain: continue current doseing.    This visit addressed three chronic issues, two of which are worsening and unstable. The third is stable.  Follow up for annual in 5 months.  Prentice Docker, MD 05/08/2020 9:44 AM

## 2020-05-11 ENCOUNTER — Other Ambulatory Visit: Payer: Self-pay | Admitting: Obstetrics & Gynecology

## 2020-05-11 ENCOUNTER — Other Ambulatory Visit: Payer: Self-pay | Admitting: Obstetrics and Gynecology

## 2020-05-11 DIAGNOSIS — F3341 Major depressive disorder, recurrent, in partial remission: Secondary | ICD-10-CM

## 2020-05-11 DIAGNOSIS — F53 Postpartum depression: Secondary | ICD-10-CM

## 2020-05-11 DIAGNOSIS — F419 Anxiety disorder, unspecified: Secondary | ICD-10-CM

## 2020-05-11 MED ORDER — BUSPIRONE HCL 10 MG PO TABS: 10.0000 mg | ORAL_TABLET | Freq: Two times a day (BID) | ORAL | 1 refills | Status: DC

## 2020-05-11 MED ORDER — SERTRALINE HCL 100 MG PO TABS: 150.0000 mg | ORAL_TABLET | Freq: Every day | ORAL | 2 refills | Status: DC

## 2020-06-15 ENCOUNTER — Other Ambulatory Visit: Payer: Self-pay | Admitting: Internal Medicine

## 2020-06-15 DIAGNOSIS — G43009 Migraine without aura, not intractable, without status migrainosus: Secondary | ICD-10-CM

## 2020-07-28 ENCOUNTER — Other Ambulatory Visit: Payer: Self-pay | Admitting: Internal Medicine

## 2020-07-28 DIAGNOSIS — G43009 Migraine without aura, not intractable, without status migrainosus: Secondary | ICD-10-CM

## 2020-07-28 NOTE — Telephone Encounter (Signed)
Requested medications are due for refill today?  Yes -This refill cannot be delegated.    Requested medications are on active medication list?  Yes  Last Refill:   05/26/2020  # 60 with no refill.  Notation made that patient needs f/u appt for migraines.    Future visit scheduled?  No  Notes to Clinic:  This refill cannot be delegated.

## 2020-07-30 ENCOUNTER — Other Ambulatory Visit: Payer: Self-pay

## 2020-07-30 ENCOUNTER — Ambulatory Visit: Payer: BC Managed Care – PPO | Admitting: Internal Medicine

## 2020-07-30 ENCOUNTER — Encounter: Payer: Self-pay | Admitting: Internal Medicine

## 2020-07-30 VITALS — BP 104/72 | HR 97 | Temp 98.2°F | Ht 68.0 in | Wt 226.0 lb

## 2020-07-30 DIAGNOSIS — F3341 Major depressive disorder, recurrent, in partial remission: Secondary | ICD-10-CM

## 2020-07-30 DIAGNOSIS — G43009 Migraine without aura, not intractable, without status migrainosus: Secondary | ICD-10-CM | POA: Diagnosis not present

## 2020-07-30 MED ORDER — TOPIRAMATE 25 MG PO TABS: 50.0000 mg | ORAL_TABLET | Freq: Every day | ORAL | 5 refills | Status: DC

## 2020-07-30 NOTE — Progress Notes (Signed)
Date:  07/30/2020   Name:  Brianna Marshall   DOB:  February 17, 1991   MRN:  [id number]   Chief Complaint: Migraine (follow up  [id number] sees new psychiatirst now )  Migraine  This is a recurrent problem. Episode frequency: 1-2 per month. The pain quality is similar to prior headaches. Pertinent negatives include no dizziness, fever, numbness or weakness. She has tried triptans (and topamax recently increased to 50 mg due to increased number of headaches) for the symptoms. The treatment provided significant relief.    Lab Results  Component Value Date   CREATININE 0.70 12/17/2019   BUN 8 12/17/2019   NA 141 12/17/2019   K 4.4 12/17/2019   CL 106 12/17/2019   CO2 20 12/17/2019   No results found for: CHOL, HDL, LDLCALC, LDLDIRECT, TRIG, CHOLHDL Lab Results  Component Value Date   TSH 1.530 12/17/2019   Lab Results  Component Value Date   HGBA1C 5.4 12/17/2019   Lab Results  Component Value Date   WBC 8.5 12/17/2019   HGB 15.7 12/17/2019   HCT 46.0 12/17/2019   MCV 91 12/17/2019   PLT 315 12/17/2019   Lab Results  Component Value Date   ALT 17 12/17/2019   AST 16 12/17/2019   ALKPHOS 75 12/17/2019   BILITOT 0.2 12/17/2019     Review of Systems  Constitutional: Negative for chills, fatigue and fever.  Respiratory: Negative for choking and shortness of breath.   Cardiovascular: Negative for chest pain.  Neurological: Positive for headaches. Negative for dizziness, weakness and numbness.  Psychiatric/Behavioral: Positive for dysphoric mood and sleep disturbance. The patient is nervous/anxious.     Patient Active Problem List   Diagnosis Date Noted  . Migraine without aura and without status migrainosus, not intractable 12/17/2019  . Sleep disorder 12/17/2019  . Alopecia 12/17/2019  . Fatigue 12/17/2019  . Anxiety 08/13/2018  . Antepartum mild preeclampsia, third trimester 07/30/2018  . Insulin resistance 01/02/2018  . Major depression in partial  remission (HCC) 01/02/2018  . PCOS (polycystic ovarian syndrome) 07/08/2016  . Prediabetes 07/08/2016  . Vitamin D deficiency 07/08/2016    Allergies  Allergen Reactions  . Shellfish Allergy Nausea And Vomiting  . Amoxicillin Rash  . Lidoderm [Lidocaine] Rash    Past Surgical History:  Procedure Laterality Date  . INCISE AND DRAIN ABCESS Right 2011   Posterior Thigh  . sebaceous cyst removal  07/2017   Back of neck  . WISDOM TOOTH EXTRACTION      Social History   Tobacco Use  . Smoking status: Never Smoker  . Smokeless tobacco: Never Used  Vaping Use  . Vaping Use: Never used  Substance Use Topics  . Alcohol use: Not Currently    Alcohol/week: 0.0 standard drinks    Comment: rarely  . Drug use: No     Medication list has been reviewed and updated.  Current Meds  Medication Sig  . albuterol (PROVENTIL HFA;VENTOLIN HFA) 108 (90 Base) MCG/ACT inhaler Inhale 2 puffs into the lungs every 6 (six) hours as needed for wheezing or shortness of breath.  Marland Kitchen buPROPion (WELLBUTRIN SR) 100 MG 12 hr tablet Take 100 mg by mouth 2 (two) times daily.  . ferrous sulfate (FERROUSUL) 325 (65 FE) MG tablet Take 1 tablet (325 mg total) by mouth 2 (two) times daily.  . Loratadine (CLARITIN PO) Take 1 tablet by mouth daily.  . Melatonin 5 MG CAPS Take 5 mg by mouth at bedtime.  . metFORMIN (  GLUCOPHAGE) 500 MG tablet Take 2 tablets (1,000 mg total) by mouth 2 (two) times daily with a meal.  . norethindrone (AYGESTIN) 5 MG tablet Take 3 tablets (15 mg total) by mouth daily.  . sertraline (ZOLOFT) 100 MG tablet Take 1.5 tablets (150 mg total) by mouth daily.  . SUMAtriptan (IMITREX) 100 MG tablet Take 1 tablet (100 mg total) by mouth every 2 (two) hours as needed for migraine. May repeat in 2 hours if headache persists or recurs.  . topiramate (TOPAMAX) 25 MG tablet TAKE 1 TO 2 TABLETS(25 TO 50 MG) BY MOUTH AT BEDTIME  . traZODone (DESYREL) 50 MG tablet Take by mouth.    PHQ 2/9 Scores  07/30/2020 01/16/2019 12/05/2018 11/21/2018  PHQ - 2 Score 4 2 3 2   PHQ- 9 Score 15 14 21 17   Exception Documentation - - - -  Not completed - - - -    GAD 7 : Generalized Anxiety Score 07/30/2020 04/03/2019 01/16/2019 12/05/2018  Nervous, Anxious, on Edge 3 3 2 2   Control/stop worrying 3 1 1 2   Worry too much - different things 3 2 2 3   Trouble relaxing 2 3 3 3   Restless 2 3 2 3   Easily annoyed or irritable 3 3 1 3   Afraid - awful might happen 3 2 1 1   Total GAD 7 Score 19 17 12 17   Anxiety Difficulty Somewhat difficult - Very difficult Somewhat difficult    BP Readings from Last 3 Encounters:  07/30/20 104/72  05/08/20 122/74  12/17/19 121/81    Physical Exam Vitals and nursing note reviewed.  Constitutional:      General: She is not in acute distress.    Appearance: She is well-developed.  HENT:     Head: Normocephalic and atraumatic.  Cardiovascular:     Rate and Rhythm: Normal rate and regular rhythm.     Pulses: Normal pulses.  Pulmonary:     Effort: Pulmonary effort is normal. No respiratory distress.     Breath sounds: No wheezing or rhonchi.  Musculoskeletal:     Cervical back: Normal range of motion.     Right lower leg: No edema.     Left lower leg: No edema.  Skin:    General: Skin is warm and dry.     Capillary Refill: Capillary refill takes less than 2 seconds.     Findings: No rash.  Neurological:     Mental Status: She is alert and oriented to person, place, and time.  Psychiatric:        Behavior: Behavior normal.        Thought Content: Thought content normal.     Wt Readings from Last 3 Encounters:  07/30/20 226 lb (102.5 kg)  05/08/20 236 lb (107 kg)  12/17/19 222 lb (100.7 kg)    BP 104/72   Pulse 97   Temp 98.2 F (36.8 C) (Oral)   Ht 5\' 8"  (1.727 m)   Wt 226 lb (102.5 kg)   SpO2 97%   BMI 34.36 kg/m   Assessment and Plan: 1. Migraine without aura and without status migrainosus, not intractable Continue topamax 50 mg and imitrex  PRN May titrate topamax up to 100 mg if needed Pt reminded to maintain hydration - topiramate (TOPAMAX) 25 MG tablet; Take 2-4 tablets (50-100 mg total) by mouth daily.  Dispense: 120 tablet; Refill: 5  2. Recurrent major depressive disorder, in partial remission (HCC) Now on Bupropion and Zoloft plus trazodone for sleep - just  started 2 weeks ago    Partially dictated using Animal nutritionist. Any errors are unintentional.  Bari Edward, MD [facility]  07/30/2020

## 2020-08-31 ENCOUNTER — Telehealth: Payer: Self-pay | Admitting: Internal Medicine

## 2020-08-31 NOTE — Telephone Encounter (Signed)
Should pt go to UC?   Copied from CRM [id number]. Topic: Appointment Scheduling - Scheduling Inquiry for Clinic >> Aug 31, 2020  3:00 PM Brianna Marshall wrote: Reason for CRM: pt needs appt for ongoing worsening asthma sx.  Pt doesn't have a current inhaler rx and wanting one.

## 2020-08-31 NOTE — Telephone Encounter (Signed)
Called pt left VM to call back.  KP 

## 2020-09-01 ENCOUNTER — Ambulatory Visit: Payer: BC Managed Care – PPO | Admitting: Family Medicine

## 2020-09-01 ENCOUNTER — Encounter: Payer: Self-pay | Admitting: Family Medicine

## 2020-09-01 ENCOUNTER — Other Ambulatory Visit: Payer: Self-pay

## 2020-09-01 VITALS — BP 120/80 | HR 96 | Ht 68.0 in | Wt 223.0 lb

## 2020-09-01 DIAGNOSIS — J4521 Mild intermittent asthma with (acute) exacerbation: Secondary | ICD-10-CM | POA: Diagnosis not present

## 2020-09-01 DIAGNOSIS — Z23 Encounter for immunization: Secondary | ICD-10-CM | POA: Diagnosis not present

## 2020-09-01 MED ORDER — ALBUTEROL SULFATE HFA 108 (90 BASE) MCG/ACT IN AERS: 2.0000 | INHALATION_SPRAY | Freq: Four times a day (QID) | RESPIRATORY_TRACT | 11 refills | Status: DC | PRN

## 2020-09-01 NOTE — Progress Notes (Signed)
Date:  09/01/2020   Name:  Brianna Marshall   DOB:  Sep 01, 1991   MRN:  [id number]   Chief Complaint: Asthma (Pt said she needs a RF on inhaler from 2017. Only uses as needed.  Expired 13 months ago. The last week her asthma has been worse and having shortness of breathe. )  Asthma There is no chest tightness, cough, difficulty breathing, frequent throat clearing, hemoptysis, hoarse voice, shortness of breath, sputum production or wheezing. This is a chronic problem. The current episode started 1 to 4 weeks ago. The problem occurs daily. The problem has been gradually improving. Pertinent negatives include no chest pain, ear pain, fever, headaches, myalgias or sore throat. Her symptoms are aggravated by pollen. Her past medical history is significant for asthma.    Lab Results  Component Value Date   CREATININE 0.70 12/17/2019   BUN 8 12/17/2019   NA 141 12/17/2019   K 4.4 12/17/2019   CL 106 12/17/2019   CO2 20 12/17/2019   No results found for: CHOL, HDL, LDLCALC, LDLDIRECT, TRIG, CHOLHDL Lab Results  Component Value Date   TSH 1.530 12/17/2019   Lab Results  Component Value Date   HGBA1C 5.4 12/17/2019   Lab Results  Component Value Date   WBC 8.5 12/17/2019   HGB 15.7 12/17/2019   HCT 46.0 12/17/2019   MCV 91 12/17/2019   PLT 315 12/17/2019   Lab Results  Component Value Date   ALT 17 12/17/2019   AST 16 12/17/2019   ALKPHOS 75 12/17/2019   BILITOT 0.2 12/17/2019     Review of Systems  Constitutional: Negative for chills and fever.  HENT: Negative for drooling, ear discharge, ear pain, hoarse voice and sore throat.   Respiratory: Negative for cough, hemoptysis, sputum production, shortness of breath and wheezing.   Cardiovascular: Negative for chest pain, palpitations and leg swelling.  Gastrointestinal: Negative for abdominal pain, blood in stool, constipation, diarrhea and nausea.  Endocrine: Negative for polydipsia.  Genitourinary: Negative for  dysuria, frequency, hematuria and urgency.  Musculoskeletal: Negative for back pain, myalgias and neck pain.  Skin: Negative for rash.  Allergic/Immunologic: Negative for environmental allergies.  Neurological: Negative for dizziness and headaches.  Hematological: Does not bruise/bleed easily.  Psychiatric/Behavioral: Negative for suicidal ideas. The patient is not nervous/anxious.     Patient Active Problem List   Diagnosis Date Noted  . Migraine without aura and without status migrainosus, not intractable 12/17/2019  . Sleep disorder 12/17/2019  . Alopecia 12/17/2019  . Fatigue 12/17/2019  . Anxiety 08/13/2018  . Antepartum mild preeclampsia, third trimester 07/30/2018  . Insulin resistance 01/02/2018  . 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  . PCOS (polycystic ovarian syndrome) 07/08/2016  . Prediabetes 07/08/2016  . Vitamin D deficiency 07/08/2016    Allergies  Allergen Reactions  . Shellfish Allergy Nausea And Vomiting  . Amoxicillin Rash  . Lidoderm [Lidocaine] Rash    Past Surgical History:  Procedure Laterality Date  . INCISE AND DRAIN ABCESS Right 2011   Posterior Thigh  . sebaceous cyst removal  07/2017   Back of neck  . WISDOM TOOTH EXTRACTION      Social History   Tobacco Use  . Smoking status: Never Smoker  . Smokeless tobacco: Never Used  Vaping Use  . Vaping Use: Never used  Substance Use Topics  . Alcohol use: Not Currently    Alcohol/week: 0.0 standard drinks    Comment: rarely  . Drug use: No  Medication list has been reviewed and updated.  Current Meds  Medication Sig  . albuterol (PROVENTIL HFA;VENTOLIN HFA) 108 (90 Base) MCG/ACT inhaler Inhale 2 puffs into the lungs every 6 (six) hours as needed for wheezing or shortness of breath.  Marland Kitchen buPROPion (WELLBUTRIN SR) 100 MG 12 hr tablet Take 100 mg by mouth 2 (two) times daily.  . ferrous sulfate (FERROUSUL) 325 (65 FE) MG tablet Take 1 tablet (325 mg total) by mouth 2  (two) times daily.  . Loratadine (CLARITIN PO) Take 1 tablet by mouth daily.  . Melatonin 5 MG CAPS Take 5 mg by mouth at bedtime.  . metFORMIN (GLUCOPHAGE) 500 MG tablet Take 2 tablets (1,000 mg total) by mouth 2 (two) times daily with a meal.  . norethindrone (AYGESTIN) 5 MG tablet Take 3 tablets (15 mg total) by mouth daily.  . sertraline (ZOLOFT) 100 MG tablet Take 1.5 tablets (150 mg total) by mouth daily.  . SUMAtriptan (IMITREX) 100 MG tablet Take 1 tablet (100 mg total) by mouth every 2 (two) hours as needed for migraine. May repeat in 2 hours if headache persists or recurs.  . topiramate (TOPAMAX) 25 MG tablet Take 2-4 tablets (50-100 mg total) by mouth daily.  . traZODone (DESYREL) 50 MG tablet Take by mouth.    PHQ 2/9 Scores 07/30/2020 01/16/2019 12/05/2018 11/21/2018  PHQ - 2 Score 4 2 3 2   PHQ- 9 Score 15 14 21 17   Exception Documentation - - - -  Not completed - - - -    GAD 7 : Generalized Anxiety Score 07/30/2020 04/03/2019 01/16/2019 12/05/2018  Nervous, Anxious, on Edge 3 3 2 2   Control/stop worrying 3 1 1 2   Worry too much - different things 3 2 2 3   Trouble relaxing 2 3 3 3   Restless 2 3 2 3   Easily annoyed or irritable 3 3 1 3   Afraid - awful might happen 3 2 1 1   Total GAD 7 Score 19 17 12 17   Anxiety Difficulty Somewhat difficult - Very difficult Somewhat difficult    BP Readings from Last 3 Encounters:  09/01/20 120/80  07/30/20 104/72  05/08/20 122/74    Physical Exam Vitals and nursing note reviewed.  Constitutional:      General: She is not in acute distress.    Appearance: She is not diaphoretic.  HENT:     Head: Normocephalic and atraumatic.     Right Ear: External ear normal.     Left Ear: External ear normal.     Nose: Nose normal.  Eyes:     General:        Right eye: No discharge.        Left eye: No discharge.     Conjunctiva/sclera: Conjunctivae normal.     Pupils: Pupils are equal, round, and reactive to light.  Neck:     Thyroid: No  thyromegaly.     Vascular: No JVD.  Cardiovascular:     Rate and Rhythm: Normal rate and regular rhythm.     Heart sounds: Normal heart sounds. No murmur heard.  No friction rub. No gallop.   Pulmonary:     Effort: Pulmonary effort is normal.     Breath sounds: Normal breath sounds.  Abdominal:     General: Bowel sounds are normal.     Palpations: Abdomen is soft. There is no mass.     Tenderness: There is no abdominal tenderness. There is no guarding.  Musculoskeletal:  General: Normal range of motion.     Cervical back: Normal range of motion and neck supple.  Lymphadenopathy:     Cervical: No cervical adenopathy.  Skin:    General: Skin is warm and dry.  Neurological:     Mental Status: She is alert.     Deep Tendon Reflexes: Reflexes are normal and symmetric.     Wt Readings from Last 3 Encounters:  09/01/20 223 lb (101.2 kg)  07/30/20 226 lb (102.5 kg)  05/08/20 236 lb (107 kg)    BP 120/80   Pulse 96   Ht 5\' 8"  (1.727 m)   Wt 223 lb (101.2 kg)   SpO2 98%   BMI 33.91 kg/m   Assessment and Plan: 1. Mild intermittent asthma with acute exacerbation Chronic.  Intermittent.  Mild.  Patient with an acute exacerbation with mild wheezing heard throughout lung fields.  Patient does have a cough that is nonproductive.  Patient had recent exposure to dust possible mold and cleaning area.  Will refill her albuterol inhalers 2 puffs every 6 hours with spacer and patient was given a Breo sample 100 mg for 1 puff a day for baseline long-acting beta agonist. - albuterol (VENTOLIN HFA) 108 (90 Base) MCG/ACT inhaler; Inhale 2 puffs into the lungs every 6 (six) hours as needed for wheezing or shortness of breath.  Dispense: 6.7 g; Refill: 11  2. Need for immunization against influenza Discussed and administered - Flu Vaccine QUAD 36+ mos IM

## 2020-10-09 ENCOUNTER — Other Ambulatory Visit (HOSPITAL_COMMUNITY)
Admission: RE | Admit: 2020-10-09 | Discharge: 2020-10-09 | Disposition: A | Discharge status: Home / Self Care | Payer: BC Managed Care – PPO | Source: Ambulatory Visit | Attending: Obstetrics and Gynecology | Admitting: Obstetrics and Gynecology

## 2020-10-09 ENCOUNTER — Ambulatory Visit (INDEPENDENT_AMBULATORY_CARE_PROVIDER_SITE_OTHER): Payer: BC Managed Care – PPO | Admitting: Obstetrics and Gynecology

## 2020-10-09 ENCOUNTER — Other Ambulatory Visit: Payer: Self-pay

## 2020-10-09 ENCOUNTER — Encounter: Payer: Self-pay | Admitting: Obstetrics and Gynecology

## 2020-10-09 VITALS — BP 112/82 | Ht 68.0 in | Wt 224.0 lb

## 2020-10-09 DIAGNOSIS — Z124 Encounter for screening for malignant neoplasm of cervix: Secondary | ICD-10-CM | POA: Insufficient documentation

## 2020-10-09 DIAGNOSIS — Z1339 Encounter for screening examination for other mental health and behavioral disorders: Secondary | ICD-10-CM

## 2020-10-09 DIAGNOSIS — Z01419 Encounter for gynecological examination (general) (routine) without abnormal findings: Secondary | ICD-10-CM

## 2020-10-09 DIAGNOSIS — Z1331 Encounter for screening for depression: Secondary | ICD-10-CM

## 2020-10-09 NOTE — Progress Notes (Signed)
Gynecology Annual Exam  PCP: Glean Hess, MD  Chief Complaint  Patient presents with  . Gynecologic Exam    abdominal pain due to endometriosis    History of Present Illness:  [name] is a 29 y.o. G1P1001 who LMP was No LMP recorded. (Menstrual status: Oral contraceptives)., presents today for her annual examination.  Her menses are absent on her medication.   She is sexually active.  She has had some pain with intercourse.  She has had some vaginal dryness. She has been using lubricant.  She is going to try to get pregnant again next August.    Last Pap: 01/02/2018  Results were: no abnormalities /neg HPV DNA not done due to age Hx of STDs: none  There is a FH of breast cancer in her maternal aunt who tested negative for BRCA. There is no FH of ovarian cancer. The patient does not do self-breast exams.  Tobacco use: The patient denies current or previous tobacco use. Alcohol use: none Exercise: no  The patient wears seatbelts: yes.   The patient reports that domestic violence in her life is absent.   Chronic pelvic pain: Overall good. Had a flare up earlier this week. She had to call out of work. She states that she gets a flare up every couple of months and last for a few days or so.  She rarely has to call out of work.  She tolerates the medication well.  However, she does have some acne from the use of the medication.    Anxiety/depression: She is seeing Dr. Nicolasa Ducking.  She states things are going well on this front.   Past Medical History:  Diagnosis Date  . Anemia in pregnancy 05/30/2018  . Asthma   . Depression   . Gestational diabetes   . Gestational diabetes 06/11/2018  . Gestational diabetes mellitus (GDM) affecting third pregnancy 07/29/2018  . Headache, migraine 07/14/2015  . Hyperemesis 06/05/2018  . Infertility, anovulation 03/10/2017  . Infertility, anovulation 03/10/2017  . Nausea/vomiting in pregnancy 01/17/2018  . Obesity   . PCOS (polycystic  ovarian syndrome) 07/08/2016  . Prediabetes 07/08/2016   Hgb 5.8  04/2015   . Supervision of high risk pregnancy, antepartum 01/02/2018   [facility] Prenatal Labs Dating Early [place] Blood type: O/Positive/-- (01/29 1038)  Genetic Screen 1 Screen: Neg Antibody:Negative (01/29 1038) Anatomic [place] Normal, anterior placenta,  Rubella: <0.90 (01/29 1038) Varicella: Imm GTT Early: 123 Third trimester:  RPR: Non Reactive (01/29 1038)  Rhogam n/a HBsAg: Negative (01/29 1038)  TDaP vaccine        05/30/2018    Flu Shot: 09/2017 HIV: Non Reac  . Vitamin D deficiency 07/08/2016   18.7 in 04/2015     Past Surgical History:  Procedure Laterality Date  . INCISE AND DRAIN ABCESS Right 2011   Posterior Thigh  . sebaceous cyst removal  07/2017   Back of neck  . WISDOM TOOTH EXTRACTION      Prior to Admission medications   Medication Sig Start Date End Date Taking? Authorizing Provider  albuterol (VENTOLIN HFA) 108 (90 Base) MCG/ACT inhaler Inhale 2 puffs into the lungs every 6 (six) hours as needed for wheezing or shortness of breath. 09/01/20  Yes Juline Patch, MD  buPROPion (WELLBUTRIN SR) 100 MG 12 hr tablet Take 100 mg by mouth 2 (two) times daily. 07/14/20  Yes [provider]  ferrous sulfate (FERROUSUL) 325 (65 FE) MG tablet Take 1 tablet (325 mg total)  by mouth 2 (two) times daily. 05/30/18  Yes Schuman, Christanna R, MD  Loratadine (CLARITIN PO) Take 1 tablet by mouth daily.   Yes [provider]  metFORMIN (GLUCOPHAGE) 500 MG tablet Take 2 tablets (1,000 mg total) by mouth 2 (two) times daily with a meal. 12/11/19  Yes Will Bonnet, MD  norethindrone (AYGESTIN) 5 MG tablet Take 3 tablets (15 mg total) by mouth daily. 09/06/19  Yes Will Bonnet, MD  sertraline (ZOLOFT) 100 MG tablet Take 1.5 tablets (150 mg total) by mouth daily. 05/11/20  Yes Will Bonnet, MD  SUMAtriptan (IMITREX) 100 MG tablet Take 1 tablet (100 mg total) by mouth every 2 (two) hours as needed for  migraine. May repeat in 2 hours if headache persists or recurs. 03/09/20  Yes Glean Hess, MD  topiramate (TOPAMAX) 25 MG tablet Take 2-4 tablets (50-100 mg total) by mouth daily. 07/30/20  Yes Glean Hess, MD  traZODone (DESYREL) 50 MG tablet Take by mouth. 07/14/20  Yes [provider]   Allergies  Allergen Reactions  . Shellfish Allergy Nausea And Vomiting  . Amoxicillin Rash  . Lidoderm [Lidocaine] Rash   Obstetric History: G1P1001  Social History   Socioeconomic History  . Marital status: Married    Spouse name: Not on file  . Number of children: Not on file  . Years of education: Not on file  . Highest education level: Not on file  Occupational History  . Not on file  Tobacco Use  . Smoking status: Never Smoker  . Smokeless tobacco: Never Used  Vaping Use  . Vaping Use: Never used  Substance and Sexual Activity  . Alcohol use: Not Currently    Alcohol/week: 0.0 standard drinks    Comment: rarely  . Drug use: No  . Sexual activity: Yes    Birth control/protection: Pill  Other Topics Concern  . Not on file  Social History Narrative  . Not on file   Social Determinants of Health   Financial Resource Strain:   . Difficulty of Paying Living Expenses: Not on file  Food Insecurity:   . Worried About Charity fundraiser in the Last Year: Not on file  . Ran Out of Food in the Last Year: Not on file  Transportation Needs:   . Lack of Transportation (Medical): Not on file  . Lack of Transportation (Non-Medical): Not on file  Physical Activity:   . Days of Exercise per Week: Not on file  . Minutes of Exercise per Session: Not on file  Stress:   . Feeling of Stress : Not on file  Social Connections:   . Frequency of Communication with Friends and Family: Not on file  . Frequency of Social Gatherings with Friends and Family: Not on file  . Attends Religious Services: Not on file  . Active Member of Clubs or Organizations: Not on file  . Attends  Archivist Meetings: Not on file  . Marital Status: Not on file  Intimate Partner Violence:   . Fear of Current or Ex-Partner: Not on file  . Emotionally Abused: Not on file  . Physically Abused: Not on file  . Sexually Abused: Not on file    Family History  Problem Relation Age of Onset  . Osteoporosis Mother   . Alcohol abuse Father   . Drug abuse Father   . Alcohol abuse Maternal Aunt   . Bipolar disorder Maternal Aunt   . Diabetes Maternal Aunt   .  Breast cancer Maternal Aunt   . Depression Paternal Grandmother   . COPD Paternal Grandmother   . Diabetes Maternal Uncle   . Diabetes Maternal Grandmother     Review of Systems  Constitutional: Negative.   HENT: Negative.   Eyes: Negative.   Respiratory: Negative.   Cardiovascular: Negative.   Gastrointestinal: Negative.   Genitourinary: Negative.   Musculoskeletal: Negative.   Skin: Negative.   Neurological: Negative.   Endo/Heme/Allergies: Positive for environmental allergies.  Psychiatric/Behavioral: Negative.      Physical Exam BP 112/82   Ht _0  (1.727 m)   Wt 224 lb (101.6 kg)   BMI 34.06 kg/m    Physical Exam Constitutional:      General: She is not in acute distress.    Appearance: Normal appearance. She is well-developed.  Genitourinary:     Pelvic exam was performed with patient in the lithotomy position.     Vulva, urethra, bladder and uterus normal.     No inguinal adenopathy present in the right or left side.    No signs of injury in the vagina.     No vaginal discharge, erythema, tenderness or bleeding.     No cervical motion tenderness, discharge, lesion or polyp.     Uterus is mobile.     Uterus is not enlarged or tender.     No uterine mass detected.    Uterus is anteverted.     No right or left adnexal mass present.     Right adnexa not tender or full.     Left adnexa not tender or full.  HENT:     Head: Normocephalic and atraumatic.  Eyes:     General: No scleral  icterus.    Conjunctiva/sclera: Conjunctivae normal.  Neck:     Thyroid: No thyromegaly.  Cardiovascular:     Rate and Rhythm: Normal rate and regular rhythm.     Heart sounds: No murmur heard.  No friction rub. No gallop.   Pulmonary:     Effort: Pulmonary effort is normal. No respiratory distress.     Breath sounds: Normal breath sounds. No wheezing or rales.  Chest:     Breasts:        Right: No inverted nipple, mass, nipple discharge, skin change or tenderness.        Left: No inverted nipple, mass, nipple discharge, skin change or tenderness.  Abdominal:     General: Bowel sounds are normal. There is no distension.     Palpations: Abdomen is soft. There is no mass.     Tenderness: There is no abdominal tenderness. There is no guarding or rebound.  Musculoskeletal:        General: No swelling or tenderness. Normal range of motion.     Cervical back: Normal range of motion and neck supple.  Lymphadenopathy:     Cervical: No cervical adenopathy.     Lower Body: No right inguinal adenopathy. No left inguinal adenopathy.  Neurological:     General: No focal deficit present.     Mental Status: She is alert and oriented to person, place, and time.     Cranial Nerves: No cranial nerve deficit.  Skin:    General: Skin is warm and dry.     Findings: No erythema or rash.  Psychiatric:        Mood and Affect: Mood normal.        Behavior: Behavior normal.        Judgment: Judgment normal.  Female chaperone present for pelvic and breast  portions of the physical exam  Results: AUDIT Questionnaire (screen for alcoholism): 0 PHQ-9: 4   Assessment: 29 y.o. G52P1001 female here for routine annual gynecologic examination  Plan: Problem List Items Addressed This Visit    None    Visit Diagnoses    Women's annual routine gynecological examination    -  Primary   Relevant Orders   Cytology - PAP   Screening for depression       Screening for alcoholism       Pap smear for  cervical cancer screening       Relevant Orders   Cytology - PAP      Screening: -- Blood pressure screen normal -- Weight screening: normal -- Depression screening negative (PHQ-9) -- Nutrition: normal -- cholesterol screening: per PCP -- osteoporosis screening: not due -- tobacco screening: not using -- alcohol screening: AUDIT questionnaire indicates low-risk usage. -- family history of breast cancer screening: done. not at high risk. -- no evidence of domestic violence or intimate partner violence. -- STD screening: gonorrhea/chlamydia NAAT not collected per patient request. -- pap smear collected per ASCCP guidelines due in a couple of months -- flu vaccine recevied -- HPV vaccination series: discussed. Considering. -- COVID19 vaccine: has received.   Prentice Docker, MD 10/09/2020 9:04 AM

## 2020-10-14 LAB — CYTOLOGY - PAP
Comment: NEGATIVE
Diagnosis: UNDETERMINED — AB
High risk HPV: NEGATIVE

## 2020-10-29 ENCOUNTER — Encounter: Payer: Self-pay | Admitting: Obstetrics and Gynecology

## 2020-11-10 ENCOUNTER — Other Ambulatory Visit: Payer: Self-pay | Admitting: Internal Medicine

## 2020-11-10 DIAGNOSIS — G43009 Migraine without aura, not intractable, without status migrainosus: Secondary | ICD-10-CM

## 2020-11-25 ENCOUNTER — Other Ambulatory Visit: Payer: Self-pay

## 2020-11-25 DIAGNOSIS — G8929 Other chronic pain: Secondary | ICD-10-CM

## 2020-11-25 MED ORDER — NORETHINDRONE ACETATE 5 MG PO TABS: 15.0000 mg | ORAL_TABLET | Freq: Every day | ORAL | 11 refills | Status: DC

## 2021-01-19 ENCOUNTER — Other Ambulatory Visit: Payer: Self-pay | Admitting: Obstetrics and Gynecology

## 2021-01-19 DIAGNOSIS — E8881 Metabolic syndrome: Secondary | ICD-10-CM

## 2021-01-19 DIAGNOSIS — E282 Polycystic ovarian syndrome: Secondary | ICD-10-CM

## 2021-01-19 DIAGNOSIS — R7303 Prediabetes: Secondary | ICD-10-CM

## 2021-01-19 MED ORDER — METFORMIN HCL 500 MG PO TABS: 1000.0000 mg | ORAL_TABLET | Freq: Two times a day (BID) | ORAL | 4 refills | Status: DC

## 2021-01-19 NOTE — Telephone Encounter (Signed)
Pt calling for refill of metformin; [facility]; has been out of it for about a week and a half; requested it weeks ago.  [phone]

## 2021-07-07 ENCOUNTER — Ambulatory Visit: Payer: BC Managed Care – PPO | Admitting: Internal Medicine

## 2021-07-07 ENCOUNTER — Other Ambulatory Visit: Payer: Self-pay

## 2021-07-07 ENCOUNTER — Encounter: Payer: Self-pay | Admitting: Internal Medicine

## 2021-07-07 VITALS — BP 96/70 | HR 86 | Temp 98.2°F | Ht 68.0 in | Wt 209.0 lb

## 2021-07-07 DIAGNOSIS — H6983 Other specified disorders of Eustachian tube, bilateral: Secondary | ICD-10-CM

## 2021-07-07 DIAGNOSIS — R059 Cough, unspecified: Secondary | ICD-10-CM

## 2021-07-07 DIAGNOSIS — J0100 Acute maxillary sinusitis, unspecified: Secondary | ICD-10-CM

## 2021-07-07 DIAGNOSIS — F3341 Major depressive disorder, recurrent, in partial remission: Secondary | ICD-10-CM | POA: Diagnosis not present

## 2021-07-07 MED ORDER — AZITHROMYCIN 250 MG PO TABS: ORAL_TABLET | ORAL | 0 refills | Status: AC

## 2021-07-07 MED ORDER — TRIAMCINOLONE ACETONIDE 55 MCG/ACT NA AERO: 2.0000 | INHALATION_SPRAY | Freq: Every day | NASAL | 12 refills | Status: DC

## 2021-07-07 MED ORDER — PROMETHAZINE-DM 6.25-15 MG/5ML PO SYRP: 5.0000 mL | ORAL_SOLUTION | Freq: Four times a day (QID) | ORAL | 0 refills | Status: DC | PRN

## 2021-07-07 NOTE — Progress Notes (Signed)
Date:  07/07/2021   Name:  Brianna Marshall   DOB:  October 14, 1991   MRN:  [id number]   Chief Complaint: Sinusitis (X4 days, Lost voice, cough with yellow mucous, no fever, right side of face is painful, migraine feeling, neg covid test 7/17, dizzy )  Sinusitis This is a new problem. The current episode started in the past 7 days. The problem has been gradually worsening since onset. There has been no fever. Associated symptoms include congestion, coughing, headaches, sinus pressure and a sore throat. Pertinent negatives include no chills. Treatments tried: mucinex, flonase, nyquil.   Lab Results  Component Value Date   CREATININE 0.70 12/17/2019   BUN 8 12/17/2019   NA 141 12/17/2019   K 4.4 12/17/2019   CL 106 12/17/2019   CO2 20 12/17/2019   No results found for: CHOL, HDL, LDLCALC, LDLDIRECT, TRIG, CHOLHDL Lab Results  Component Value Date   TSH 1.530 12/17/2019   Lab Results  Component Value Date   HGBA1C 5.4 12/17/2019   Lab Results  Component Value Date   WBC 8.5 12/17/2019   HGB 15.7 12/17/2019   HCT 46.0 12/17/2019   MCV 91 12/17/2019   PLT 315 12/17/2019   Lab Results  Component Value Date   ALT 17 12/17/2019   AST 16 12/17/2019   ALKPHOS 75 12/17/2019   BILITOT 0.2 12/17/2019     Review of Systems  Constitutional:  Negative for chills, fatigue and fever.  HENT:  Positive for congestion, postnasal drip, sinus pressure, sore throat and tinnitus.   Eyes:  Negative for visual disturbance.  Respiratory:  Positive for cough. Negative for chest tightness and wheezing.   Cardiovascular:  Negative for chest pain and palpitations.  Neurological:  Positive for headaches.   Patient Active Problem List   Diagnosis Date Noted   Migraine without aura and without status migrainosus, not intractable 12/17/2019   Sleep disorder 12/17/2019   Alopecia 12/17/2019   Fatigue 12/17/2019   Anxiety 08/13/2018   Antepartum mild preeclampsia, third trimester 07/30/2018    Insulin resistance 01/02/2018   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   PCOS (polycystic ovarian syndrome) 07/08/2016   Prediabetes 07/08/2016   Vitamin D deficiency 07/08/2016    Allergies  Allergen Reactions   Shellfish Allergy Nausea And Vomiting   Amoxicillin Rash   Lidoderm [Lidocaine] Rash    Past Surgical History:  Procedure Laterality Date   INCISE AND DRAIN ABCESS Right 2011   Posterior Thigh   sebaceous cyst removal  07/2017   Back of neck   WISDOM TOOTH EXTRACTION      Social History   Tobacco Use   Smoking status: Never   Smokeless tobacco: Never  Vaping Use   Vaping Use: Never used  Substance Use Topics   Alcohol use: Not Currently    Alcohol/week: 0.0 standard drinks    Comment: rarely   Drug use: No     Medication list has been reviewed and updated.  Current Meds  Medication Sig   albuterol (VENTOLIN HFA) 108 (90 Base) MCG/ACT inhaler Inhale 2 puffs into the lungs every 6 (six) hours as needed for wheezing or shortness of breath.   azithromycin (ZITHROMAX Z-PAK) 250 MG tablet UAD   buPROPion (WELLBUTRIN SR) 100 MG 12 hr tablet Take 100 mg by mouth 2 (two) times daily.   ferrous sulfate (FERROUSUL) 325 (65 FE) MG tablet Take 1 tablet (325 mg total) by mouth 2 (two) times daily.   Loratadine (  CLARITIN PO) Take 1 tablet by mouth daily.   metFORMIN (GLUCOPHAGE) 500 MG tablet Take 2 tablets (1,000 mg total) by mouth 2 (two) times daily with a meal.   norethindrone (AYGESTIN) 5 MG tablet Take 3 tablets (15 mg total) by mouth daily.   promethazine-dextromethorphan (PROMETHAZINE-DM) 6.25-15 MG/5ML syrup Take 5 mLs by mouth 4 (four) times daily as needed for cough.   sertraline (ZOLOFT) 100 MG tablet Take 1.5 tablets (150 mg total) by mouth daily.   SUMAtriptan (IMITREX) 100 MG tablet TAKE 1 TABLET BY MOUTH EVERY 2 HOURS AS NEEDED FOR MIGRAINE. MAY REPEAT IN 2 HOUR IF HEADACHE PERSISTS OR RECURS   topiramate (TOPAMAX) 25 MG tablet Take 2-4  tablets (50-100 mg total) by mouth daily.   triamcinolone (NASACORT) 55 MCG/ACT AERO nasal inhaler Place 2 sprays into the nose daily.   [DISCONTINUED] Melatonin 5 MG CAPS Take 5 mg by mouth at bedtime.   [DISCONTINUED] traZODone (DESYREL) 50 MG tablet Take by mouth.    PHQ 2/9 Scores 07/07/2021 07/30/2020 01/16/2019 12/05/2018  PHQ - 2 Score 0 4 2 3   PHQ- 9 Score 7 15 14 21   Exception Documentation - - - -  Not completed - - - -    GAD 7 : Generalized Anxiety Score 07/07/2021 07/30/2020 04/03/2019 01/16/2019  Nervous, Anxious, on Edge 1 3 3 2   Control/stop worrying 0 3 1 1   Worry too much - different things 1 3 2 2   Trouble relaxing 1 2 3 3   Restless 0 2 3 2   Easily annoyed or irritable 1 3 3 1   Afraid - awful might happen 0 3 2 1   Total GAD 7 Score 4 19 17 12   Anxiety Difficulty - Somewhat difficult - Very difficult    BP Readings from Last 3 Encounters:  07/07/21 96/70  10/09/20 112/82  09/01/20 120/80    Physical Exam Constitutional:      Appearance: Normal appearance. She is well-developed.  HENT:     Right Ear: Ear canal and external ear normal. Tympanic membrane is retracted. Tympanic membrane is not erythematous.     Left Ear: Ear canal and external ear normal. Tympanic membrane is retracted. Tympanic membrane is not erythematous.     Nose:     Right Sinus: Maxillary sinus tenderness and frontal sinus tenderness present.     Left Sinus: Maxillary sinus tenderness and frontal sinus tenderness present.     Mouth/Throat:     Mouth: No oral lesions.     Pharynx: Uvula midline. Posterior oropharyngeal erythema present. No oropharyngeal exudate.  Cardiovascular:     Rate and Rhythm: Normal rate and regular rhythm.     Heart sounds: Normal heart sounds.  Pulmonary:     Breath sounds: Normal breath sounds. No wheezing or rales.  Musculoskeletal:     Cervical back: Normal range of motion.  Lymphadenopathy:     Cervical: No cervical adenopathy.  Neurological:     Mental  Status: She is alert and oriented to person, place, and time.    Wt Readings from Last 3 Encounters:  07/07/21 209 lb (94.8 kg)  10/09/20 224 lb (101.6 kg)  09/01/20 223 lb (101.2 kg)    BP 96/70   Pulse 86   Temp 98.2 F (36.8 C) (Oral)   Ht 5\' 8"  (1.727 m)   Wt 209 lb (94.8 kg)   SpO2 98%   BMI 31.78 kg/m   Assessment and Plan: 1. Acute non-recurrent maxillary sinusitis Continue mucinex, add sudafed and  nasocort - azithromycin (ZITHROMAX Z-PAK) 250 MG tablet; UAD  Dispense: 6 each; Refill: 0  2. Cough Use syrup at HS only to help with sleep - promethazine-dextromethorphan (PROMETHAZINE-DM) 6.25-15 MG/5ML syrup; Take 5 mLs by mouth 4 (four) times daily as needed for cough.  Dispense: 118 mL; Refill: 0  3. Eustachian tube dysfunction, bilateral If tinnitus persists despite nasal steroids, recommend ENT evaluation - triamcinolone (NASACORT) 55 MCG/ACT AERO nasal inhaler; Place 2 sprays into the nose daily.  Dispense: 1 each; Refill: 12  4. Recurrent major depressive disorder, in partial remission (HCC) Stable on Zoloft and Bupropion   Partially dictated using Animal nutritionist. Any errors are unintentional.  Bari Edward, MD [facility]  07/07/2021

## 2021-07-07 NOTE — Patient Instructions (Signed)
Start sudefed 30 mg 2-3 times a day as needed for sinus congestion

## 2021-07-09 ENCOUNTER — Ambulatory Visit: Payer: BC Managed Care – PPO | Admitting: Internal Medicine

## 2021-07-29 ENCOUNTER — Telehealth: Payer: Self-pay | Admitting: Internal Medicine

## 2021-07-29 NOTE — Telephone Encounter (Signed)
Referral Request - Has patient seen PCP for this complaint? Yes.   *If NO, is insurance requiring patient see PCP for this issue before PCP can refer them? Referral for which specialty: ENT Preferred provider/office: [facility] [place] Reason for referral: Sinus infection

## 2021-07-30 ENCOUNTER — Other Ambulatory Visit: Payer: Self-pay

## 2021-07-30 DIAGNOSIS — J0100 Acute maxillary sinusitis, unspecified: Secondary | ICD-10-CM

## 2021-07-30 DIAGNOSIS — H6983 Other specified disorders of Eustachian tube, bilateral: Secondary | ICD-10-CM

## 2021-07-30 NOTE — Telephone Encounter (Signed)
A referral was placed for ENT.  KP

## 2021-08-23 ENCOUNTER — Other Ambulatory Visit: Payer: Self-pay | Admitting: Internal Medicine

## 2021-08-23 DIAGNOSIS — G43009 Migraine without aura, not intractable, without status migrainosus: Secondary | ICD-10-CM

## 2021-08-24 ENCOUNTER — Other Ambulatory Visit: Payer: Self-pay | Admitting: Internal Medicine

## 2021-08-24 DIAGNOSIS — G43009 Migraine without aura, not intractable, without status migrainosus: Secondary | ICD-10-CM

## 2021-08-24 NOTE — Telephone Encounter (Signed)
Requested medication (s) are due for refill today: yes  Requested medication (s) are on the active medication list: yes  Last refill:  07/30/21  Future visit scheduled: no  Notes to clinic: Please review for refill. Refill not delegated per protocol    Requested Prescriptions  Pending Prescriptions Disp Refills   topiramate (TOPAMAX) 25 MG tablet [Pharmacy Med Name: TOPIRAMATE 25MG  TABLETS] 120 tablet 5    Sig: TAKE 2 TO 4 TABLETS(50 TO 100 MG) BY MOUTH DAILY     Not Delegated - Neurology: Anticonvulsants - topiramate & zonisamide Failed - 08/23/2021 11:50 PM      Failed - This refill cannot be delegated      Failed - Cr in normal range and within 360 days    Creatinine, Ser  Date Value Ref Range Status  12/17/2019 0.70 0.57 - 1.00 mg/dL Final   Creatinine, Urine  Date Value Ref Range Status  07/29/2018 119 mg/dL Final          Failed - CO2 in normal range and within 360 days    CO2  Date Value Ref Range Status  12/17/2019 20 20 - 29 mmol/L Final          Passed - Valid encounter within last 12 months    Recent Outpatient Visits           1 month ago Acute non-recurrent maxillary sinusitis   [facility]   11 months ago Mild intermittent asthma with acute exacerbation   [facility]   1 year ago Recurrent major depressive disorder, in partial remission [facility])   [facility], MD   1 year ago Migraine without aura and without status migrainosus, not intractable   [facility], MD   2 years ago Migraine without aura and without status migrainosus, not intractable   [facility], [place]

## 2021-08-24 NOTE — Telephone Encounter (Signed)
Requested medication (s) are due for refill today: no  Requested medication (s) are on the active medication list: yes  Last refill:  08/24/21. Patient requesting a 30 da supply  Future visit scheduled: no  Notes to clinic:  Please see request for 90 day supply.  To pharmacy: **Patient requests 90 days supply**    Last ordered: Today by Reubin Milan, MD Last refill: 08/24/2021      Requested Prescriptions  Pending Prescriptions Disp Refills   topiramate (TOPAMAX) 25 MG tablet [Pharmacy Med Name: TOPIRAMATE 25MG  TABLETS] 360 tablet     Sig: TAKE 2 TO 4 TABLETS(50 TO 100 MG) BY MOUTH DAILY     Not Delegated - Neurology: Anticonvulsants - topiramate & zonisamide Failed - 08/24/2021  4:04 PM      Failed - This refill cannot be delegated      Failed - Cr in normal range and within 360 days    Creatinine, Ser  Date Value Ref Range Status  12/17/2019 0.70 0.57 - 1.00 mg/dL Final   Creatinine, Urine  Date Value Ref Range Status  07/29/2018 119 mg/dL Final          Failed - CO2 in normal range and within 360 days    CO2  Date Value Ref Range Status  12/17/2019 20 20 - 29 mmol/L Final          Passed - Valid encounter within last 12 months    Recent Outpatient Visits           1 month ago Acute non-recurrent maxillary sinusitis   [facility] [place]   11 months ago Mild intermittent asthma with acute exacerbation   [facility] [place]   1 year ago Recurrent major depressive disorder, in partial remission [facility])   [facility] [facility], MD   1 year ago Migraine without aura and without status migrainosus, not intractable   [facility], MD   2 years ago Migraine without aura and without status migrainosus, not intractable   [facility], MD

## 2021-10-07 ENCOUNTER — Other Ambulatory Visit: Payer: Self-pay | Admitting: Internal Medicine

## 2021-10-07 DIAGNOSIS — G43009 Migraine without aura, not intractable, without status migrainosus: Secondary | ICD-10-CM

## 2021-10-08 NOTE — Telephone Encounter (Signed)
Requested medication (s) are due for refill today: Yes  Requested medication (s) are on the active medication list: Yes  Last refill:  08/24/21  Future visit scheduled: No  Notes to clinic:  Protocol indicates pt. Needs lab work.    Requested Prescriptions  Pending Prescriptions Disp Refills   topiramate (TOPAMAX) 25 MG tablet [Pharmacy Med Name: TOPIRAMATE 25MG  TABLETS] 360 tablet     Sig: TAKE 2 TO 4 TABLETS(50 TO 100 MG) BY MOUTH DAILY     Not Delegated - Neurology: Anticonvulsants - topiramate & zonisamide Failed - 10/07/2021 11:13 PM      Failed - This refill cannot be delegated      Failed - Cr in normal range and within 360 days    Creatinine, Ser  Date Value Ref Range Status  12/17/2019 0.70 0.57 - 1.00 mg/dL Final   Creatinine, Urine  Date Value Ref Range Status  07/29/2018 119 mg/dL Final          Failed - CO2 in normal range and within 360 days    CO2  Date Value Ref Range Status  12/17/2019 20 20 - 29 mmol/L Final          Passed - Valid encounter within last 12 months    Recent Outpatient Visits           3 months ago Acute non-recurrent maxillary sinusitis   [facility] 12/19/2019, MD   1 year ago Mild intermittent asthma with acute exacerbation   [facility] Reubin Milan, MD   1 year ago Recurrent major depressive disorder, in partial remission [facility])   [facility] [facility], MD   1 year ago Migraine without aura and without status migrainosus, not intractable   [facility], MD   2 years ago Migraine without aura and without status migrainosus, not intractable   [facility], [place]

## 2021-12-15 ENCOUNTER — Encounter: Payer: Self-pay | Admitting: Otolaryngology

## 2021-12-17 ENCOUNTER — Other Ambulatory Visit: Payer: Self-pay | Admitting: Internal Medicine

## 2021-12-17 DIAGNOSIS — G43009 Migraine without aura, not intractable, without status migrainosus: Secondary | ICD-10-CM

## 2021-12-17 NOTE — Telephone Encounter (Signed)
Requested medication (s) are due for refill today: yes  Requested medication (s) are on the active medication list: yes  Last refill:  10/08/21  Future visit scheduled: no  Notes to clinic:  discussed at visit 07/30/20, did have another visit 07/07/21, no upcoming visit scheduled. This medication can not be delegated, please assess.   Requested Prescriptions  Pending Prescriptions Disp Refills   topiramate (TOPAMAX) 25 MG tablet [Pharmacy Med Name: TOPIRAMATE 25MG  TABLETS] 120 tablet 1    Sig: TAKE 2 TO 4 TABLETS(50 TO 100 MG) BY MOUTH DAILY     Not Delegated - Neurology: Anticonvulsants - topiramate & zonisamide Failed - 12/17/2021  1:21 AM      Failed - This refill cannot be delegated      Failed - Cr in normal range and within 360 days    Creatinine, Ser  Date Value Ref Range Status  12/17/2019 0.70 0.57 - 1.00 mg/dL Final   Creatinine, Urine  Date Value Ref Range Status  07/29/2018 119 mg/dL Final          Failed - CO2 in normal range and within 360 days    CO2  Date Value Ref Range Status  12/17/2019 20 20 - 29 mmol/L Final          Passed - Valid encounter within last 12 months    Recent Outpatient Visits           5 months ago Acute non-recurrent maxillary sinusitis   [facility] 12/19/2019, MD   1 year ago Mild intermittent asthma with acute exacerbation   [facility] Reubin Milan, MD   1 year ago Recurrent major depressive disorder, in partial remission [facility])   [facility], MD   2 years ago Migraine without aura and without status migrainosus, not intractable   [facility], MD   3 years ago Migraine without aura and without status migrainosus, not intractable   [facility], MD

## 2021-12-22 NOTE — Discharge Instructions (Addendum)
[facility] ENDOSCOPIC SINUS SURGERY [facility]  What is Functional Endoscopic Sinus Surgery?  The Surgery involves making the natural openings of the sinuses larger by removing the bony partitions that separate the sinuses from the nasal cavity.  The natural sinus lining is preserved as much as possible to allow the sinuses to resume normal function after the surgery.  In some patients nasal polyps (excessively swollen lining of the sinuses) may be removed to relieve obstruction of the sinus openings.  The surgery is performed through the nose using lighted scopes, which eliminates the need for incisions on the face.  A septoplasty is a different procedure which is sometimes performed with sinus surgery.  It involves straightening the boy partition that separates the two sides of your nose.  A crooked or deviated septum may need repair if is obstructing the sinuses or nasal airflow.  Turbinate reduction is also often performed during sinus surgery.  The turbinates are bony proturberances from the side walls of the nose which swell and can obstruct the nose in patients with sinus and allergy problems.  Their size can be surgically reduced to help relieve nasal obstruction.  What Can Sinus Surgery Do For Me?  Sinus surgery can reduce the frequency of sinus infections requiring antibiotic treatment.  This can provide improvement in nasal congestion, post-nasal drainage, facial pressure and nasal obstruction.  Surgery will NOT prevent you from ever having an infection again, so it usually only for patients who get infections 4 or more times yearly requiring antibiotics, or for infections that do not clear with antibiotics.  It will not cure nasal allergies, so patients with allergies may still require medication to treat their allergies after surgery. Surgery may improve headaches related to sinusitis, however, some people will continue to  require medication to control sinus headaches related to allergies.  Surgery will do nothing for other forms of headache (migraine, tension or cluster).  What Are the Risks of Endoscopic Sinus Surgery?  Current techniques allow surgery to be performed safely with little risk, however, there are rare complications that patients should be aware of.  Because the sinuses are located around the eyes, there is risk of eye injury, including blindness, though again, this would be quite rare. This is usually a result of bleeding behind the eye during surgery, which can effect vision, though there are treatments to protect the vision and prevent permanent injury. More serious complications would include bleeding inside the brain cavity or damage to the brain.This happens when the fluid around the brain leaks out into the sinus cavity.  Again, all of these complications are uncommon, and spinal fluid leaks can be safely managed surgically if they occur.  The most common complication of sinus surgery is bleeding from the nose, which may require packing or cauterization of the nose.  Patients with polyps may experience recurrence of the polyps that would require revision surgery.  Alterations of sense of smell or injury to the tear ducts are also rare complications.   What is the Surgery Like, and what is the Recovery?  The Surgery usually takes a couple of hours to perform, and is usually performed under a general anesthetic (completely asleep).  Patients are usually discharged home after a couple of hours.  Sometimes during surgery it is necessary to pack the nose to control bleeding, and the packing is left in place for 24 - 48 hours, and removed by your surgeon.  If   a septoplasty was performed during the procedure, there is often a splint placed which must be removed after 5-7 days.   Discomfort: Pain is usually mild to moderate, and can be controlled by prescription pain medication or acetaminophen (Tylenol).   Aspirin, Ibuprofen (Advil, Motrin), or Naprosyn (Aleve) should be avoided, as they can cause increased bleeding.  Most patients feel sinus pressure like they have a bad head cold for several days.  Sleeping with your head elevated can help reduce swelling and facial pressure, as can ice packs over the face.  A humidifier may be helpful to keep the mucous and blood from drying in the nose.   Diet: There are no specific diet restrictions, however, you should generally start with clear liquids and a light diet of bland foods because the anesthetic can cause some nausea.  Advance your diet depending on how your stomach feels.  Taking your pain medication with food will often help reduce stomach upset which pain medications can cause.  Nasal Saline Irrigation: It is important to remove blood clots and dried mucous from the nose as it is healing.  This is done by having you irrigate the nose at least 3 - 4 times daily with a salt water solution.  We recommend using NeilMed Sinus Rinse (available at the drug store).  Fill the squeeze bottle with the solution, bend over a sink, and insert the tip of the squeeze bottle into the nose  of an inch.  Point the tip of the squeeze bottle towards the inside corner of the eye on the same side your irrigating.  Squeeze the bottle and gently irrigate the nose.  If you bend forward as you do this, most of the fluid will flow back out of the nose, instead of down your throat.   The solution should be warm, near body temperature, when you irrigate.   Each time you irrigate, you should use a full squeeze bottle.   Note that if you are instructed to use Nasal Steroid Sprays at any time after your surgery, irrigate with saline BEFORE using the steroid spray, so you do not wash it all out of the nose. Another product, Nasal Saline Gel (such as AYR Nasal Saline Gel) can be applied in each nostril 3 - 4 times daily to moisture the nose and reduce scabbing or crusting.  Bleeding:   Bloody drainage from the nose can be expected for several days, and patients are instructed to irrigate their nose frequently with salt water to help remove mucous and blood clots.  The drainage may be dark red or brown, though some fresh blood may be seen intermittently, especially after irrigation.  Do not blow you nose, as bleeding may occur. If you must sneeze, keep your mouth open to allow air to escape through your mouth.  If heavy bleeding occurs: Irrigate the nose with saline to rinse out clots, then spray the nose 3 - 4 times with Afrin Nasal Decongestant Spray.  The spray will constrict the blood vessels to slow bleeding.  Pinch the lower half of your nose shut to apply pressure, and lay down with your head elevated.  Ice packs over the nose may help as well. If bleeding persists despite these measures, you should notify your doctor.  Do not use the Afrin routinely to control nasal congestion after surgery, as it can result in worsening congestion and may affect healing.     Activity: Return to work varies among patients. Most patients will be out   of work at least 5 - 7 days to recover.  Patient may return to work after they are off of narcotic pain medication, and feeling well enough to perform the functions of their job.  Patients must avoid heavy lifting (over 10 pounds) or strenuous physical for 2 weeks after surgery, so your employer may need to assign you to light duty, or keep you out of work longer if light duty is not possible.  NOTE: you should not drive, operate dangerous machinery, do any mentally demanding tasks or make any important legal or financial decisions while on narcotic pain medication and recovering from the general anesthetic.    Call Your Doctor Immediately if You Have Any of the Following: Bleeding that you cannot control with the above measures Loss of vision, double vision, bulging of the eye or black eyes. Fever over 101 degrees Neck stiffness with severe headache,  fever, nausea and change in mental state. You are always encouraged to call anytime with concerns, however, please call with requests for pain medication refills during office hours.  Office Endoscopy: During follow-up visits your doctor will remove any packing or splints that may have been placed and evaluate and clean your sinuses endoscopically.  Topical anesthetic will be used to make this as comfortable as possible, though you may want to take your pain medication prior to the visit.  How often this will need to be done varies from patient to patient.  After complete recovery from the surgery, you may need follow-up endoscopy from time to time, particularly if there is concern of recurrent infection or nasal polyps.  

## 2021-12-30 ENCOUNTER — Ambulatory Visit: Payer: BC Managed Care – PPO | Admitting: Anesthesiology

## 2021-12-30 ENCOUNTER — Other Ambulatory Visit: Payer: Self-pay

## 2021-12-30 ENCOUNTER — Ambulatory Visit
Admission: RE | Admit: 2021-12-30 | Discharge: 2021-12-30 | Disposition: A | Discharge status: Home / Self Care | Payer: BC Managed Care – PPO | Attending: Otolaryngology | Admitting: Otolaryngology

## 2021-12-30 ENCOUNTER — Encounter: Payer: Self-pay | Admitting: Otolaryngology

## 2021-12-30 ENCOUNTER — Encounter
Admission: RE | Disposition: A | Discharge status: Home / Self Care | Payer: Self-pay | Source: Home / Self Care | Attending: Otolaryngology

## 2021-12-30 DIAGNOSIS — F32A Depression, unspecified: Secondary | ICD-10-CM | POA: Diagnosis not present

## 2021-12-30 DIAGNOSIS — J342 Deviated nasal septum: Secondary | ICD-10-CM | POA: Insufficient documentation

## 2021-12-30 DIAGNOSIS — J45909 Unspecified asthma, uncomplicated: Secondary | ICD-10-CM | POA: Diagnosis not present

## 2021-12-30 DIAGNOSIS — F419 Anxiety disorder, unspecified: Secondary | ICD-10-CM | POA: Diagnosis not present

## 2021-12-30 DIAGNOSIS — J343 Hypertrophy of nasal turbinates: Secondary | ICD-10-CM | POA: Insufficient documentation

## 2021-12-30 HISTORY — PX: SEPTOPLASTY: SHX2393

## 2021-12-30 HISTORY — PX: TURBINATE REDUCTION: SHX6157

## 2021-12-30 HISTORY — DX: Dizziness and giddiness: R42

## 2021-12-30 HISTORY — PX: ENDOSCOPIC CONCHA BULLOSA RESECTION: SHX6395

## 2021-12-30 LAB — POCT PREGNANCY, URINE: Preg Test, Ur: NEGATIVE

## 2021-12-30 SURGERY — SEPTOPLASTY, NOSE
Anesthesia: General | Laterality: Right

## 2021-12-30 MED ORDER — OXYMETAZOLINE HCL 0.05 % NA SOLN
2.0000 | Freq: Once | NASAL | Status: AC
  Administered 2021-12-30: 2 via NASAL

## 2021-12-30 MED ORDER — PROPOFOL 10 MG/ML IV BOLUS
INTRAVENOUS | Status: DC | PRN
  Administered 2021-12-30: 200 mg via INTRAVENOUS

## 2021-12-30 MED ORDER — FENTANYL CITRATE PF 50 MCG/ML IJ SOSY
25.0000 ug | PREFILLED_SYRINGE | INTRAMUSCULAR | Status: DC | PRN
  Administered 2021-12-30 (×3): 25 ug via INTRAVENOUS

## 2021-12-30 MED ORDER — CEPHALEXIN 500 MG PO CAPS: 500.0000 mg | ORAL_CAPSULE | Freq: Two times a day (BID) | ORAL | 0 refills | Status: DC

## 2021-12-30 MED ORDER — OXYCODONE HCL 5 MG/5ML PO SOLN
5.0000 mg | Freq: Once | ORAL | Status: AC | PRN
  Administered 2021-12-30: 5 mg via ORAL

## 2021-12-30 MED ORDER — GLYCOPYRROLATE 0.2 MG/ML IJ SOLN
INTRAMUSCULAR | Status: DC | PRN
  Administered 2021-12-30: .1 mg via INTRAVENOUS

## 2021-12-30 MED ORDER — BUPIVACAINE HCL (PF) 0.5 % IJ SOLN
INTRAMUSCULAR | Status: DC | PRN
  Administered 2021-12-30: 15 mL
  Administered 2021-12-30: 5 mL

## 2021-12-30 MED ORDER — ACETAMINOPHEN 160 MG/5ML PO SOLN: 325.0000 mg | ORAL | Status: DC | PRN

## 2021-12-30 MED ORDER — HYDROCODONE-ACETAMINOPHEN 5-325 MG PO TABS: 1.0000 | ORAL_TABLET | Freq: Four times a day (QID) | ORAL | 0 refills | Status: AC | PRN

## 2021-12-30 MED ORDER — PREDNISONE 10 MG PO TABS: ORAL_TABLET | ORAL | 0 refills | Status: DC

## 2021-12-30 MED ORDER — SCOPOLAMINE 1 MG/3DAYS TD PT72
1.0000 | MEDICATED_PATCH | Freq: Once | TRANSDERMAL | Status: DC
  Administered 2021-12-30: 1.5 mg via TRANSDERMAL

## 2021-12-30 MED ORDER — ACETAMINOPHEN 10 MG/ML IV SOLN
1000.0000 mg | Freq: Once | INTRAVENOUS | Status: AC
Start: 2021-12-30 — End: 2021-12-30
  Administered 2021-12-30: 1000 mg via INTRAVENOUS

## 2021-12-30 MED ORDER — DEXTROSE 5 % IV SOLN
2000.0000 mg | Freq: Once | INTRAVENOUS | Status: AC
  Administered 2021-12-30: 2000 mg via INTRAVENOUS

## 2021-12-30 MED ORDER — OXYCODONE HCL 5 MG PO TABS: 5.0000 mg | ORAL_TABLET | Freq: Once | ORAL | Status: AC | PRN

## 2021-12-30 MED ORDER — DEXAMETHASONE SODIUM PHOSPHATE 4 MG/ML IJ SOLN
INTRAMUSCULAR | Status: DC | PRN
  Administered 2021-12-30: 10 mg via INTRAVENOUS

## 2021-12-30 MED ORDER — FENTANYL CITRATE (PF) 100 MCG/2ML IJ SOLN
INTRAMUSCULAR | Status: DC | PRN
  Administered 2021-12-30: 25 ug via INTRAVENOUS
  Administered 2021-12-30: 50 ug via INTRAVENOUS
  Administered 2021-12-30: 25 ug via INTRAVENOUS
  Administered 2021-12-30: 50 ug via INTRAVENOUS

## 2021-12-30 MED ORDER — ACETAMINOPHEN 325 MG PO TABS: 325.0000 mg | ORAL_TABLET | ORAL | Status: DC | PRN

## 2021-12-30 MED ORDER — HEMOSTATIC AGENTS (NO CHARGE) OPTIME
TOPICAL | Status: DC | PRN
Start: 2021-12-30 — End: 2021-12-30
  Administered 2021-12-30: 1 via TOPICAL

## 2021-12-30 MED ORDER — PHENYLEPHRINE HCL (PRESSORS) 10 MG/ML IV SOLN
INTRAVENOUS | Status: DC | PRN
  Administered 2021-12-30: 15 mL

## 2021-12-30 MED ORDER — SUCCINYLCHOLINE CHLORIDE 200 MG/10ML IV SOSY
PREFILLED_SYRINGE | INTRAVENOUS | Status: DC | PRN
  Administered 2021-12-30: 100 mg via INTRAVENOUS

## 2021-12-30 MED ORDER — ONDANSETRON HCL 4 MG/2ML IJ SOLN
INTRAMUSCULAR | Status: DC | PRN
  Administered 2021-12-30: 4 mg via INTRAVENOUS

## 2021-12-30 MED ORDER — MIDAZOLAM HCL 5 MG/5ML IJ SOLN
INTRAMUSCULAR | Status: DC | PRN
  Administered 2021-12-30: 2 mg via INTRAVENOUS

## 2021-12-30 MED ORDER — LACTATED RINGERS IV SOLN: INTRAVENOUS | Status: DC

## 2021-12-30 SURGICAL SUPPLY — 25 items
CANISTER SUCT 1200ML W/VALVE (MISCELLANEOUS) ×3 IMPLANT
CATH IV 18X1 1/4 SAFELET (CATHETERS) ×3 IMPLANT
COAGULATOR SUCT 8FR VV (MISCELLANEOUS) ×3 IMPLANT
DRAPE HEAD BAR (DRAPES) ×3 IMPLANT
ELECT REM PT RETURN 9FT ADLT (ELECTROSURGICAL) ×3
ELECTRODE REM PT RTRN 9FT ADLT (ELECTROSURGICAL) ×2 IMPLANT
GLOVE SURG GAMMEX PI TX LF 7.5 (GLOVE) ×7 IMPLANT
GOWN STRL REUS W/ TWL LRG LVL3 (GOWN DISPOSABLE) ×2 IMPLANT
GOWN STRL REUS W/TWL LRG LVL3 (GOWN DISPOSABLE) ×6
IV CATH 18X1 1/4 SAFELET (CATHETERS) ×2
KIT TURNOVER KIT A (KITS) ×3 IMPLANT
NDL HYPO 27GX1-1/4 (NEEDLE) ×2 IMPLANT
NEEDLE HYPO 27GX1-1/4 (NEEDLE) ×6 IMPLANT
PACK ENT CUSTOM (PACKS) ×3 IMPLANT
PACKING NASAL EPIS 4X2.4 XEROG (MISCELLANEOUS) ×1 IMPLANT
PATTIES SURGICAL .5 X3 (DISPOSABLE) ×4 IMPLANT
SOL ANTI-FOG 6CC FOG-OUT (MISCELLANEOUS) ×2 IMPLANT
SOL FOG-OUT ANTI-FOG 6CC (MISCELLANEOUS) ×1
SPLINT NASAL SEPTAL BLV .50 ST (MISCELLANEOUS) ×3 IMPLANT
STRAP BODY AND KNEE 60X3 (MISCELLANEOUS) ×5 IMPLANT
SUT ETHILON 3-0 KS 30 BLK (SUTURE) ×3 IMPLANT
SUT PLAIN GUT 4-0 (SUTURE) ×3 IMPLANT
SYR 3ML LL SCALE MARK (SYRINGE) ×4 IMPLANT
TOWEL OR 17X26 4PK STRL BLUE (TOWEL DISPOSABLE) ×3 IMPLANT
WATER STERILE IRR 250ML POUR (IV SOLUTION) ×3 IMPLANT

## 2021-12-30 NOTE — H&P (Signed)
H&P has been reviewed and patient reevaluated, no changes necessary. To be downloaded later.  

## 2021-12-30 NOTE — Anesthesia Procedure Notes (Addendum)
Procedure Name: Intubation Date/Time: 12/30/2021 8:50 AM Performed by: Mayme Genta, CRNA Pre-anesthesia Checklist: Patient identified, Emergency Drugs available, Suction available, Patient being monitored and Timeout performed Patient Re-evaluated:Patient Re-evaluated prior to induction Oxygen Delivery Method: Circle system utilized Preoxygenation: Pre-oxygenation with 100% oxygen Induction Type: IV induction Ventilation: Mask ventilation without difficulty Laryngoscope Size: Mac and 3 Grade View: Grade I Tube type: Oral Rae Tube size: 7.0 mm Number of attempts: 1 Placement Confirmation: ETT inserted through vocal cords under direct vision, positive ETCO2 and breath sounds checked- equal and bilateral Tube secured with: Tape Dental Injury: Teeth and Oropharynx as per pre-operative assessment  Comments: Intubation by Dr Vernard Gambles.

## 2021-12-30 NOTE — Op Note (Signed)
12/30/2021  10:17 AM  [id number]   Pre-Op Dx:  Deviated Nasal Septum, Hypertrophic Right Inferior Turbinate, Concha bullosa right middle turbinate  Post-op Dx: Same  Proc: Nasal Septoplasty, Partial Reduction Right Inferior Turbinate, Trimming of right middle turbinate concha bullosa endoscopically   Surg:  Beverly Sessions Braison Snoke  Anes:  GOT  EBL: 50 mL  Comp: None  Findings: Septum markedly deviated to the left side pinching the left nasal airway and walking airflow on the left side.  The right inferior middle turbinates were overgrown and there was a large concha bullosa of the right middle turbinate.  The left middle turbinate was very thin and lateralized.    Procedure: With the patient in a comfortable supine position,  general orotracheal anesthesia was induced without difficulty.     The patient received preoperative Afrin spray for topical decongestion and vasoconstriction.  Intravenous prophylactic antibiotics were administered.  At an appropriate level, the patient was placed in a semi-sitting position.  Nasal vibrissae were trimmed.   0.5% bupivacaine, 4-1/2 cc's, was infiltrated into the anterior floor of the nose, into the nasal spine region, into the membranous columella, and finally into the submucoperichondrial plane of the septum on both sides.  Several minutes were allowed for this to take effect.  Cottoniod pledgetts soaked in Afrin and 0.5% bupivacaine were placed into both nasal cavities and left while the patient was prepped and draped in the standard fashion.  The materials were removed from the nose and observed to be intact and correct in number.  The nose was inspected with a headlight and zero degree scope with the findings as described above.  A left hemitransfixion incision was sharply executed and carried down to the quadrangular cartilage. The mucoperichondrium was elelvated along the quadrangular plate back to the bony-cartilaginous junction on both sides of  the cartilage. The mucoperiostium was then elevated along the ethmoid plate and the vomer. The boney-catilaginous junction was then split with a freer elevator and the mucoperiosteum was elevated on the opposite side. The mucoperiosteum was then elevated along the maxillary crest as needed to expose the crooked bone of the crest.  Boney spurs of the vomer and maxillary crest were removed with Lenoria Chime forceps.  The cartilaginous plate was trimmed along its posterior and inferior borders of about 2 mm of cartilage to free it up inferiorly. Some of the deviated ethmoid plate was then fractured and removed with Takahashi forceps to free up the posterior border of the quadrangular plate and allow it to swing back to the midline. The mucosal flaps were placed back into their anatomic position to allow visualization of the airways. The septum now sat in the midline with an improved airway on the left side but the right side now was narrower because of the enlarged turbinates..  A 3-0 Chromic suture on a Keith needle in used to anchor the inferior septum at the nasal spine with a through and through suture. The mucosal flaps are then sutured together using a through and through whip stitch of 4-0 Plain Gut with a mini-Keith needle. This was used to close the hemitransfixion incision as well.   The inferior turbinates were then inspected. An incision was created along the inferior aspect of the right inferior turbinate with removal of some of the inferior soft tissue and bone. Electrocautery was used to control bleeding in the area. The remaining turbinate was then outfractured to open up the airway further. There was no significant bleeding noted. The right turbinate was  not addressed as it was normal in size.  0 degree scope was then used to visualize the middle turbinates.  The right middle turbinate was very wide because it had a large concha bullosa.  A Glorious Peach was used to incise the middle turbinate starting at  the bottom in the midline middle and incising one half of the concha bullosa.  The lateral half of the concha bullosa was removed using small through biting forceps.  A 3 biting forceps were used to smooth the edges of the remaining half of the concha bullosa.  This portion was pushing into the septum and now was outfractured some to bring it back to a more normal position in the nose.  Xerogel was placed into the middle meatus and the turbinate was outfractured to it.  The left side was then visualized with the 0 degree scope and the smaller middle turbinate was infractured to open up the middle meatus.  The middle meatus was then filled with some xerogel to help hold it more to the midline.  The airways were then visualized and showed open passageways on both sides that were significantly improved compared to before surgery. There was no signifcant bleeding. Nasal splints were applied to both sides of the septum using Xomed 0.48mm regular sized splints that were trimmed, and then held in position with a 3-0 Nylon through and through suture.  The patient tolerated the procedure well.  There were no complications.  The patient was turned back over to anesthesia, and awakened, extubated, and taken to the PACU in satisfactory condition.  Dispo:   PACU to home  Plan: Ice, elevation, narcotic analgesia, steroid taper, and prophylactic antibiotics for the duration of indwelling nasal foreign bodies.  We will reevaluate the patient in the office in 6 days and remove the septal splints.  Return to work in 10 days, strenuous activities in two weeks.   Beverly Sessions Mertis Mosher 12/30/2021 10:17 AM

## 2021-12-30 NOTE — Anesthesia Postprocedure Evaluation (Signed)
Anesthesia Post Note  Patient: Brianna Marshall  Procedure(s) Performed: SEPTOPLASTY PARTIAL EXCISION RIGHT INFERIOR TURBINATE (Right) RIGHT CONCHA BULLOSA REDUCTION VIA NASAL ENDOSCOPY (Right)     Patient location during evaluation: PACU Anesthesia Type: General Level of consciousness: awake Pain management: pain level controlled Vital Signs Assessment: post-procedure vital signs reviewed and stable Respiratory status: respiratory function stable Cardiovascular status: stable Postop Assessment: no signs of nausea or vomiting Anesthetic complications: no   No notable events documented.  Jola Babinski

## 2021-12-30 NOTE — Anesthesia Preprocedure Evaluation (Signed)
Anesthesia Evaluation  Patient identified by MRN, date of birth, ID band Patient awake    Reviewed: Allergy & Precautions, NPO status   Airway Mallampati: II  TM Distance: >3 FB     Dental   Pulmonary asthma ,    Pulmonary exam normal        Cardiovascular  Rhythm:Regular Rate:Normal     Neuro/Psych  Headaches, PSYCHIATRIC DISORDERS Anxiety Depression    GI/Hepatic   Endo/Other  diabetes (gestational)PCOS  Renal/GU      Musculoskeletal   Abdominal   Peds  Hematology   Anesthesia Other Findings   Reproductive/Obstetrics                             Anesthesia Physical Anesthesia Plan  ASA: 2  Anesthesia Plan: General   Post-op Pain Management:    Induction: Intravenous  PONV Risk Score and Plan: Propofol infusion, TIVA and Treatment may vary due to age or medical condition  Airway Management Planned: Natural Airway and Nasal Cannula  Additional Equipment:   Intra-op Plan:   Post-operative Plan:   Informed Consent: I have reviewed the patients History and Physical, chart, labs and discussed the procedure including the risks, benefits and alternatives for the proposed anesthesia with the patient or authorized representative who has indicated his/her understanding and acceptance.       Plan Discussed with: CRNA  Anesthesia Plan Comments:         Anesthesia Quick Evaluation

## 2021-12-30 NOTE — Transfer of Care (Signed)
Immediate Anesthesia Transfer of Care Note  Patient: Brianna Marshall  Procedure(s) Performed: SEPTOPLASTY PARTIAL EXCISION RIGHT INFERIOR TURBINATE (Right) RIGHT CONCHA BULLOSA REDUCTION VIA NASAL ENDOSCOPY (Right)  Patient Location: PACU  Anesthesia Type: General  Level of Consciousness: awake, alert  and patient cooperative  Airway and Oxygen Therapy: Patient Spontanous Breathing and Patient connected to supplemental oxygen  Post-op Assessment: Post-op Vital signs reviewed, Patient's Cardiovascular Status Stable, Respiratory Function Stable, Patent Airway and No signs of Nausea or vomiting  Post-op Vital Signs: Reviewed and stable  Complications: No notable events documented.

## 2021-12-31 ENCOUNTER — Encounter: Payer: Self-pay | Admitting: Otolaryngology

## 2022-01-05 ENCOUNTER — Other Ambulatory Visit: Payer: Self-pay | Admitting: Internal Medicine

## 2022-01-05 DIAGNOSIS — G43009 Migraine without aura, not intractable, without status migrainosus: Secondary | ICD-10-CM

## 2022-01-05 NOTE — Telephone Encounter (Signed)
Requested medications are due for refill today.  yes  Requested medications are on the active medications list.  yes  Last refill. 12/17/2021 #60  Future visit scheduled.   no  Notes to clinic.  Medication not delegated.    Requested Prescriptions  Pending Prescriptions Disp Refills   topiramate (TOPAMAX) 25 MG tablet [Pharmacy Med Name: TOPIRAMATE 25MG  TABLETS] 60 tablet 0    Sig: TAKE 2 TO 4 TABLETS(50 TO 100 MG) BY MOUTH DAILY     Not Delegated - Neurology: Anticonvulsants - topiramate & zonisamide Failed - 01/05/2022 10:04 PM      Failed - This refill cannot be delegated      Failed - Cr in normal range and within 360 days    Creatinine, Ser  Date Value Ref Range Status  12/17/2019 0.70 0.57 - 1.00 mg/dL Final   Creatinine, Urine  Date Value Ref Range Status  07/29/2018 119 mg/dL Final          Failed - CO2 in normal range and within 360 days    CO2  Date Value Ref Range Status  12/17/2019 20 20 - 29 mmol/L Final          Passed - Valid encounter within last 12 months    Recent Outpatient Visits           6 months ago Acute non-recurrent maxillary sinusitis   [facility] Glean Hess, MD   1 year ago Mild intermittent asthma with acute exacerbation   [facility] Juline Patch, MD   1 year ago Recurrent major depressive disorder, in partial remission [facility])   [facility] Glean Hess, MD   2 years ago Migraine without aura and without status migrainosus, not intractable   [facility] Glean Hess, MD   3 years ago Migraine without aura and without status migrainosus, not intractable   [facility] Glean Hess, MD

## 2022-01-28 ENCOUNTER — Other Ambulatory Visit: Payer: Self-pay | Admitting: Internal Medicine

## 2022-01-28 DIAGNOSIS — G43009 Migraine without aura, not intractable, without status migrainosus: Secondary | ICD-10-CM

## 2022-01-29 NOTE — Telephone Encounter (Signed)
Requested medications are due for refill today.  yes  Requested medications are on the active medications list.  yes  Last refill. 01/06/2022 #60 0 refills  Future visit scheduled.   no  Notes to clinic.  Failed protocol d/t expired labs.    Requested Prescriptions  Pending Prescriptions Disp Refills   topiramate (TOPAMAX) 25 MG tablet [Pharmacy Med Name: TOPIRAMATE 25MG  TABLETS] 60 tablet 0    Sig: TAKE 2 TO 4 TABLETS(50 TO 100 MG) BY MOUTH DAILY     Neurology: Anticonvulsants - topiramate & zonisamide Failed - 01/28/2022 11:57 PM      Failed - Cr in normal range and within 360 days    Creatinine, Ser  Date Value Ref Range Status  12/17/2019 0.70 0.57 - 1.00 mg/dL Final   Creatinine, Urine  Date Value Ref Range Status  07/29/2018 119 mg/dL Final          Failed - CO2 in normal range and within 360 days    CO2  Date Value Ref Range Status  12/17/2019 20 20 - 29 mmol/L Final          Failed - ALT in normal range and within 360 days    ALT  Date Value Ref Range Status  12/17/2019 17 0 - 32 IU/L Final          Failed - AST in normal range and within 360 days    AST  Date Value Ref Range Status  12/17/2019 16 0 - 40 IU/L Final          Passed - Completed PHQ-2 or PHQ-9 in the last 360 days      Passed - Valid encounter within last 12 months    Recent Outpatient Visits           6 months ago Acute non-recurrent maxillary sinusitis   [facility] 12/19/2019, MD   1 year ago Mild intermittent asthma with acute exacerbation   [facility]   1 year ago Recurrent major depressive disorder, in partial remission [facility])   [facility], MD   2 years ago Migraine without aura and without status migrainosus, not intractable   [facility], MD   3 years ago Migraine without aura and without status migrainosus, not intractable   [facility], MD

## 2022-01-31 ENCOUNTER — Other Ambulatory Visit: Payer: Self-pay | Admitting: Internal Medicine

## 2022-01-31 ENCOUNTER — Other Ambulatory Visit: Payer: Self-pay

## 2022-01-31 ENCOUNTER — Telehealth: Payer: Self-pay

## 2022-01-31 DIAGNOSIS — G43009 Migraine without aura, not intractable, without status migrainosus: Secondary | ICD-10-CM

## 2022-01-31 MED ORDER — TOPIRAMATE 25 MG PO TABS: 50.0000 mg | ORAL_TABLET | Freq: Every day | ORAL | 0 refills | Status: DC

## 2022-01-31 NOTE — Telephone Encounter (Signed)
Okay to send 1 month supply of topiramate?

## 2022-01-31 NOTE — Telephone Encounter (Signed)
Prescription sent to the pharmacy electronically.  °

## 2022-01-31 NOTE — Telephone Encounter (Signed)
Patient will run out of topiramate (TOPAMAX) 25 MG tablet  Will need refill til her app march 2.

## 2022-01-31 NOTE — Telephone Encounter (Signed)
Patient needs an appointment for future refills.  Please schedule.  

## 2022-02-01 NOTE — Telephone Encounter (Signed)
Requested medication (s) are due for refill today: No  Requested medication (s) are on the active medication list: Yes  Last refill:  01/31/22  Future visit scheduled: Yes  Notes to clinic:  Pt. Requesting 90 day supply.    Requested Prescriptions  Pending Prescriptions Disp Refills   topiramate (TOPAMAX) 25 MG tablet [Pharmacy Med Name: TOPIRAMATE 25MG  TABLETS] 360 tablet     Sig: TAKE 2 TO 4 TABLETS(50 TO 100 MG) BY MOUTH DAILY     Neurology: Anticonvulsants - topiramate & zonisamide Failed - 01/31/2022 12:28 PM      Failed - Cr in normal range and within 360 days    Creatinine, Ser  Date Value Ref Range Status  12/17/2019 0.70 0.57 - 1.00 mg/dL Final   Creatinine, Urine  Date Value Ref Range Status  07/29/2018 119 mg/dL Final          Failed - CO2 in normal range and within 360 days    CO2  Date Value Ref Range Status  12/17/2019 20 20 - 29 mmol/L Final          Failed - ALT in normal range and within 360 days    ALT  Date Value Ref Range Status  12/17/2019 17 0 - 32 IU/L Final          Failed - AST in normal range and within 360 days    AST  Date Value Ref Range Status  12/17/2019 16 0 - 40 IU/L Final          Passed - Completed PHQ-2 or PHQ-9 in the last 360 days      Passed - Valid encounter within last 12 months    Recent Outpatient Visits           6 months ago Acute non-recurrent maxillary sinusitis   [facility] 12/19/2019, MD   1 year ago Mild intermittent asthma with acute exacerbation   [facility] Reubin Milan, MD   1 year ago Recurrent major depressive disorder, in partial remission [facility])   [facility], MD   2 years ago Migraine without aura and without status migrainosus, not intractable   [facility], MD   3 years ago Migraine without aura and without status migrainosus, not intractable   [facility] Reubin Milan, MD       Future  Appointments             In 2 weeks Reubin Milan Judithann Graves, MD [facility]

## 2022-02-17 ENCOUNTER — Encounter: Payer: Self-pay | Admitting: Internal Medicine

## 2022-02-17 ENCOUNTER — Ambulatory Visit: Payer: BC Managed Care – PPO | Admitting: Internal Medicine

## 2022-02-17 ENCOUNTER — Other Ambulatory Visit: Payer: Self-pay

## 2022-02-17 VITALS — BP 124/82 | HR 81 | Ht 68.0 in | Wt 209.0 lb

## 2022-02-17 DIAGNOSIS — J4520 Mild intermittent asthma, uncomplicated: Secondary | ICD-10-CM

## 2022-02-17 DIAGNOSIS — F3341 Major depressive disorder, recurrent, in partial remission: Secondary | ICD-10-CM | POA: Diagnosis not present

## 2022-02-17 DIAGNOSIS — G43009 Migraine without aura, not intractable, without status migrainosus: Secondary | ICD-10-CM

## 2022-02-17 MED ORDER — SUMATRIPTAN SUCCINATE 100 MG PO TABS: ORAL_TABLET | ORAL | 2 refills | Status: DC

## 2022-02-17 NOTE — Progress Notes (Signed)
? ? ?Date:  02/17/2022  ? ?Name:  Brianna Marshall   DOB:  02-25-1991   MRN:  [id number] ? ? ?Chief Complaint: Depression and Asthma ?She is going to a fertility specialist in the near future.  She will need to make some medication changes and wants to discuss.  She has already stopped Topamax. ? ?Depression ?       This is a chronic (followed by Psych) problem.The problem is unchanged.  Associated symptoms include no fatigue and no headaches.  Past treatments include SSRIs - Selective serotonin reuptake inhibitors (and bupropion). ?Asthma ?She complains of shortness of breath. There is no cough or wheezing. This is a recurrent problem. The problem occurs intermittently. Pertinent negatives include no chest pain or headaches. Her symptoms are alleviated by beta-agonist. Her past medical history is significant for asthma.  ?Migraine  ?This is a recurrent problem. The pain quality is similar to prior headaches. Pertinent negatives include no abdominal pain, coughing, dizziness or weakness. She has tried triptans (and Topamax) for the symptoms.  ? ?Lab Results  ?Component Value Date  ? NA 141 12/17/2019  ? K 4.4 12/17/2019  ? CO2 20 12/17/2019  ? GLUCOSE 96 12/17/2019  ? BUN 8 12/17/2019  ? CREATININE 0.70 12/17/2019  ? CALCIUM 9.8 12/17/2019  ? GFRNONAA 119 12/17/2019  ? ?No results found for: CHOL, HDL, LDLCALC, LDLDIRECT, TRIG, CHOLHDL ?Lab Results  ?Component Value Date  ? TSH 1.530 12/17/2019  ? ?Lab Results  ?Component Value Date  ? HGBA1C 5.4 12/17/2019  ? ?Lab Results  ?Component Value Date  ? WBC 8.5 12/17/2019  ? HGB 15.7 12/17/2019  ? HCT 46.0 12/17/2019  ? MCV 91 12/17/2019  ? PLT 315 12/17/2019  ? ?Lab Results  ?Component Value Date  ? ALT 17 12/17/2019  ? AST 16 12/17/2019  ? ALKPHOS 75 12/17/2019  ? BILITOT 0.2 12/17/2019  ? ?No results found for: 25OHVITD2, 25OHVITD3, VD25OH  ? ?Review of Systems  ?Constitutional:  Negative for fatigue and unexpected weight change.  ?HENT:  Negative for nosebleeds.    ?Eyes:  Negative for visual disturbance.  ?Respiratory:  Positive for shortness of breath. Negative for cough, chest tightness and wheezing.   ?Cardiovascular:  Negative for chest pain, palpitations and leg swelling.  ?Gastrointestinal:  Negative for abdominal pain, constipation and diarrhea.  ?Neurological:  Negative for dizziness, weakness, light-headedness and headaches.  ?Psychiatric/Behavioral:  Positive for depression and dysphoric mood. Negative for sleep disturbance. The patient is not nervous/anxious.   ? ?Patient Active Problem List  ? Diagnosis Date Noted  ? Migraine without aura and without status migrainosus, not intractable 12/17/2019  ? Sleep disorder 12/17/2019  ? Alopecia 12/17/2019  ? Fatigue 12/17/2019  ? Anxiety 08/13/2018  ? Insulin resistance 01/02/2018  ? 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  ? PCOS (polycystic ovarian syndrome) 07/08/2016  ? Prediabetes 07/08/2016  ? Vitamin D deficiency 07/08/2016  ? ? ?Allergies  ?Allergen Reactions  ? Shellfish Allergy Nausea And Vomiting  ? Amoxicillin Rash  ? Lidoderm [Lidocaine] Rash  ? ? ?Past Surgical History:  ?Procedure Laterality Date  ? ENDOSCOPIC CONCHA BULLOSA RESECTION Right 12/30/2021  ? Procedure: RIGHT CONCHA BULLOSA REDUCTION VIA NASAL ENDOSCOPY;  Surgeon: Vernie Murders, MD;  Location: [facility];  Service: ENT;  Laterality: Right;  ? INCISE AND DRAIN ABCESS Right 2011  ? Posterior Thigh  ? sebaceous cyst removal  07/2017  ? Back of neck  ? SEPTOPLASTY N/A 12/30/2021  ? Procedure: SEPTOPLASTY;  Surgeon: Vernie Murders, MD;  Location: [facility];  Service: ENT;  Laterality: N/A;  ? TURBINATE REDUCTION Right 12/30/2021  ? Procedure: PARTIAL EXCISION RIGHT INFERIOR TURBINATE;  Surgeon: Vernie Murders, MD;  Location: [facility];  Service: ENT;  Laterality: Right;  ? WISDOM TOOTH EXTRACTION    ? ? ?Social History  ? ?Tobacco Use  ? Smoking status: Never  ? Smokeless tobacco: Never  ?Vaping Use  ? Vaping  Use: Never used  ?Substance Use Topics  ? Alcohol use: Not Currently  ?  Alcohol/week: 0.0 standard drinks  ?  Comment: rarely  ? Drug use: No  ? ? ? ?Medication list has been reviewed and updated. ? ?Current Meds  ?Medication Sig  ? albuterol (VENTOLIN HFA) 108 (90 Base) MCG/ACT inhaler Inhale 2 puffs into the lungs every 6 (six) hours as needed for wheezing or shortness of breath.  ? buPROPion (WELLBUTRIN SR) 100 MG 12 hr tablet Take 100 mg by mouth 2 (two) times daily.  ? metFORMIN (GLUCOPHAGE) 500 MG tablet Take 2 tablets (1,000 mg total) by mouth 2 (two) times daily with a meal.  ? montelukast (SINGULAIR) 10 MG tablet Take 10 mg by mouth at bedtime.  ? sertraline (ZOLOFT) 100 MG tablet Take 1.5 tablets (150 mg total) by mouth daily.  ? SUMAtriptan (IMITREX) 100 MG tablet TAKE 1 TABLET BY MOUTH EVERY 2 HOURS AS NEEDED FOR MIGRAINE. MAY REPEAT IN 2 HOUR IF HEADACHE PERSISTS OR RECURS  ? triamcinolone (NASACORT) 55 MCG/ACT AERO nasal inhaler Place 2 sprays into the nose daily.  ? ? ?PHQ 2/9 Scores 02/17/2022 07/07/2021 07/30/2020 01/16/2019  ?PHQ - 2 Score 1 0 4 2  ?PHQ- 9 Score 8 7 15 14   ?Exception Documentation - - - -  ?Not completed - - - -  ? ? ?GAD 7 : Generalized Anxiety Score 02/17/2022 07/07/2021 07/30/2020 04/03/2019  ?Nervous, Anxious, on Edge 2 1 3 3   ?Control/stop worrying 1 0 3 1  ?Worry too much - different things 2 1 3 2   ?Trouble relaxing 2 1 2 3   ?Restless 1 0 2 3  ?Easily annoyed or irritable 1 1 3 3   ?Afraid - awful might happen 0 0 3 2  ?Total GAD 7 Score 9 4 19 17   ?Anxiety Difficulty Somewhat difficult - Somewhat difficult -  ? ? ?BP Readings from Last 3 Encounters:  ?02/17/22 124/82  ?12/30/21 (!) 115/52  ?07/07/21 96/70  ? ? ?Physical Exam ?Vitals and nursing note reviewed.  ?Constitutional:   ?   General: She is not in acute distress. ?   Appearance: Normal appearance. She is well-developed.  ?HENT:  ?   Head: Normocephalic and atraumatic.  ?Cardiovascular:  ?   Rate and Rhythm: Normal rate and  regular rhythm.  ?Pulmonary:  ?   Effort: Pulmonary effort is normal. No respiratory distress.  ?   Breath sounds: No wheezing or rhonchi.  ?Musculoskeletal:  ?   Cervical back: Normal range of motion.  ?   Right lower leg: No edema.  ?   Left lower leg: No edema.  ?Lymphadenopathy:  ?   Cervical: No cervical adenopathy.  ?Skin: ?   General: Skin is warm and dry.  ?   Findings: No rash.  ?Neurological:  ?   General: No focal deficit present.  ?   Mental Status: She is alert and oriented to person, place, and time.  ?Psychiatric:     ?   Mood and Affect: Mood normal.     ?  Behavior: Behavior normal.  ? ? ?Wt Readings from Last 3 Encounters:  ?02/17/22 209 lb (94.8 kg)  ?12/30/21 207 lb (93.9 kg)  ?07/07/21 209 lb (94.8 kg)  ? ? ?BP 124/82   Pulse 81   Ht 5\' 8"  (1.727 m)   Wt 209 lb (94.8 kg)   SpO2 97%   BMI 31.78 kg/m?  ? ?Assessment and Plan: ?1. Recurrent major depressive disorder, in partial remission (HCC) ?She will continue Sertraline and bupropion for now and discuss dose reduction in anticipation of fertility treatments with her Psych at next visit. ? ?2. Migraine without aura and without status migrainosus, not intractable ?Agree with stopping Topamax. ?Continue PRN Imitrex for now but should stop it once treatment starts ?Can take Tylenol instead ?- SUMAtriptan (IMITREX) 100 MG tablet; TAKE 1 TABLET BY MOUTH EVERY 2 HOURS AS NEEDED FOR MIGRAINE. MAY REPEAT IN 2 HOUR IF HEADACHE PERSISTS OR RECURS  Dispense: 10 tablet; Refill: 2 ? ?3. Mild intermittent asthma without complication ?Doing well with PRN albuterol ?She may continue this throughout fertility treatments and pregnancy ? ? ?Partially dictated using . Any errors are unintentional. ? ?Animal nutritionist, MD ?[facility] ?[facility] ? ?02/17/2022 ? ? ? ? ?

## 2022-02-17 NOTE — Progress Notes (Signed)
? ? ?Date:  02/17/2022  ? ?Name:  Brianna Marshall   DOB:  03/04/1991   MRN:  [id number] ? ? ?Chief Complaint: Depression and Asthma ? ?Depression ?      ?Asthma ?Her past medical history is significant for asthma.  ? ?Lab Results  ?Component Value Date  ? NA 141 12/17/2019  ? K 4.4 12/17/2019  ? CO2 20 12/17/2019  ? GLUCOSE 96 12/17/2019  ? BUN 8 12/17/2019  ? CREATININE 0.70 12/17/2019  ? CALCIUM 9.8 12/17/2019  ? GFRNONAA 119 12/17/2019  ? ?No results found for: CHOL, HDL, LDLCALC, LDLDIRECT, TRIG, CHOLHDL ?Lab Results  ?Component Value Date  ? TSH 1.530 12/17/2019  ? ?Lab Results  ?Component Value Date  ? HGBA1C 5.4 12/17/2019  ? ?Lab Results  ?Component Value Date  ? WBC 8.5 12/17/2019  ? HGB 15.7 12/17/2019  ? HCT 46.0 12/17/2019  ? MCV 91 12/17/2019  ? PLT 315 12/17/2019  ? ?Lab Results  ?Component Value Date  ? ALT 17 12/17/2019  ? AST 16 12/17/2019  ? ALKPHOS 75 12/17/2019  ? BILITOT 0.2 12/17/2019  ? ?No results found for: 25OHVITD2, 25OHVITD3, VD25OH  ? ?Review of Systems  ?Psychiatric/Behavioral:  Positive for depression.   ? ?Patient Active Problem List  ? Diagnosis Date Noted  ? Migraine without aura and without status migrainosus, not intractable 12/17/2019  ? Sleep disorder 12/17/2019  ? Alopecia 12/17/2019  ? Fatigue 12/17/2019  ? Anxiety 08/13/2018  ? Insulin resistance 01/02/2018  ? 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  ? PCOS (polycystic ovarian syndrome) 07/08/2016  ? Prediabetes 07/08/2016  ? Vitamin D deficiency 07/08/2016  ? ? ?Allergies  ?Allergen Reactions  ? Shellfish Allergy Nausea And Vomiting  ? Amoxicillin Rash  ? Lidoderm [Lidocaine] Rash  ? ? ?Past Surgical History:  ?Procedure Laterality Date  ? ENDOSCOPIC CONCHA BULLOSA RESECTION Right 12/30/2021  ? Procedure: RIGHT CONCHA BULLOSA REDUCTION VIA NASAL ENDOSCOPY;  Surgeon: Vernie Murders, MD;  Location: [facility];  Service: ENT;  Laterality: Right;  ? INCISE AND DRAIN ABCESS Right 2011  ? Posterior Thigh  ?  sebaceous cyst removal  07/2017  ? Back of neck  ? SEPTOPLASTY N/A 12/30/2021  ? Procedure: SEPTOPLASTY;  Surgeon: Vernie Murders, MD;  Location: [facility];  Service: ENT;  Laterality: N/A;  ? TURBINATE REDUCTION Right 12/30/2021  ? Procedure: PARTIAL EXCISION RIGHT INFERIOR TURBINATE;  Surgeon: Vernie Murders, MD;  Location: [facility];  Service: ENT;  Laterality: Right;  ? WISDOM TOOTH EXTRACTION    ? ? ?Social History  ? ?Tobacco Use  ? Smoking status: Never  ? Smokeless tobacco: Never  ?Vaping Use  ? Vaping Use: Never used  ?Substance Use Topics  ? Alcohol use: Not Currently  ?  Alcohol/week: 0.0 standard drinks  ?  Comment: rarely  ? Drug use: No  ? ? ? ?Medication list has been reviewed and updated. ? ?No outpatient medications have been marked as taking for the 02/17/22 encounter (Office Visit) with Reubin Milan, MD.  ? ? ?PHQ 2/9 Scores 07/07/2021 07/30/2020 01/16/2019 12/05/2018  ?PHQ - 2 Score 0 4 2 3   ?PHQ- 9 Score 7 15 14 21   ?Exception Documentation - - - -  ?Not completed - - - -  ? ? ?GAD 7 : Generalized Anxiety Score 07/07/2021 07/30/2020 04/03/2019 01/16/2019  ?Nervous, Anxious, on Edge 1 3 3 2   ?Control/stop worrying 0 3 1 1   ?Worry too much - different things  1 3 2 2   ?Trouble relaxing 1 2 3 3   ?Restless 0 2 3 2   ?Easily annoyed or irritable 1 3 3 1   ?Afraid - awful might happen 0 3 2 1   ?Total GAD 7 Score 4 19 17 12   ?Anxiety Difficulty - Somewhat difficult - Very difficult  ? ? ?BP Readings from Last 3 Encounters:  ?12/30/21 (!) 115/52  ?07/07/21 96/70  ?10/09/20 112/82  ? ? ?Physical Exam ? ?Wt Readings from Last 3 Encounters:  ?12/30/21 207 lb (93.9 kg)  ?07/07/21 209 lb (94.8 kg)  ?10/09/20 224 lb (101.6 kg)  ? ? ?Ht 5\' 8"  (1.727 m)   BMI 31.47 kg/m?  ? ?Assessment and Plan: ? ? ? ? ?

## 2022-06-14 ENCOUNTER — Ambulatory Visit: Payer: BC Managed Care – PPO | Admitting: Internal Medicine

## 2022-06-14 ENCOUNTER — Encounter: Payer: Self-pay | Admitting: Internal Medicine

## 2022-06-14 VITALS — BP 114/84 | HR 92 | Temp 98.3°F | Ht 68.0 in | Wt 225.0 lb

## 2022-06-14 DIAGNOSIS — J029 Acute pharyngitis, unspecified: Secondary | ICD-10-CM

## 2022-06-14 MED ORDER — AZITHROMYCIN 250 MG PO TABS: ORAL_TABLET | ORAL | 0 refills | Status: AC

## 2022-06-14 NOTE — Progress Notes (Signed)
Date:  06/14/2022   Name:  Brianna Marshall   DOB:  12/03/1991   MRN:  [id number]   Chief Complaint: Cough (Sinus congestion, no covid text )  Sore Throat  This is a new problem. The current episode started in the past 7 days. The problem has been unchanged. The maximum temperature recorded prior to her arrival was 100.4 - 100.9 F (only one day last week). The fever has been present for Less than 1 day. The pain is moderate. Associated symptoms include diarrhea, ear pain, a hoarse voice and trouble swallowing. Pertinent negatives include no congestion, coughing, ear discharge, shortness of breath or vomiting. She has tried acetaminophen for the symptoms. The treatment provided moderate relief.    Lab Results  Component Value Date   NA 141 12/17/2019   K 4.4 12/17/2019   CO2 20 12/17/2019   GLUCOSE 96 12/17/2019   BUN 8 12/17/2019   CREATININE 0.70 12/17/2019   CALCIUM 9.8 12/17/2019   GFRNONAA 119 12/17/2019   No results found for: "CHOL", "HDL", "LDLCALC", "LDLDIRECT", "TRIG", "CHOLHDL" Lab Results  Component Value Date   TSH 1.530 12/17/2019   Lab Results  Component Value Date   HGBA1C 5.4 12/17/2019   Lab Results  Component Value Date   WBC 8.5 12/17/2019   HGB 15.7 12/17/2019   HCT 46.0 12/17/2019   MCV 91 12/17/2019   PLT 315 12/17/2019   Lab Results  Component Value Date   ALT 17 12/17/2019   AST 16 12/17/2019   ALKPHOS 75 12/17/2019   BILITOT 0.2 12/17/2019   No results found for: "25OHVITD2", "25OHVITD3", "VD25OH"   Review of Systems  Constitutional:  Negative for diaphoresis and fatigue.  HENT:  Positive for ear pain, hoarse voice, sore throat and trouble swallowing. Negative for congestion and ear discharge.   Respiratory:  Negative for cough and shortness of breath.   Cardiovascular:  Negative for chest pain and palpitations.  Gastrointestinal:  Positive for diarrhea. Negative for nausea and vomiting.  Neurological:  Negative for dizziness and  light-headedness.    Patient Active Problem List   Diagnosis Date Noted   Migraine without aura and without status migrainosus, not intractable 12/17/2019   Sleep disorder 12/17/2019   Alopecia 12/17/2019   Fatigue 12/17/2019   Anxiety 08/13/2018   Insulin resistance 01/02/2018   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   PCOS (polycystic ovarian syndrome) 07/08/2016   Prediabetes 07/08/2016   Vitamin D deficiency 07/08/2016    Allergies  Allergen Reactions   Shellfish Allergy Nausea And Vomiting and Nausea Only   Amoxicillin Rash and Other (See Comments)   Lidoderm [Lidocaine] Rash    Past Surgical History:  Procedure Laterality Date   ENDOSCOPIC CONCHA BULLOSA RESECTION Right 12/30/2021   Procedure: RIGHT CONCHA BULLOSA REDUCTION VIA NASAL ENDOSCOPY;  Surgeon: Vernie Murders, MD;  Location: [facility];  Service: ENT;  Laterality: Right;   INCISE AND DRAIN ABCESS Right 2011   Posterior Thigh   sebaceous cyst removal  07/2017   Back of neck   SEPTOPLASTY N/A 12/30/2021   Procedure: SEPTOPLASTY;  Surgeon: Vernie Murders, MD;  Location: [facility];  Service: ENT;  Laterality: N/A;   TURBINATE REDUCTION Right 12/30/2021   Procedure: PARTIAL EXCISION RIGHT INFERIOR TURBINATE;  Surgeon: Vernie Murders, MD;  Location: [facility];  Service: ENT;  Laterality: Right;   WISDOM TOOTH EXTRACTION      Social History   Tobacco Use   Smoking status: Never  Smokeless tobacco: Never  Vaping Use   Vaping Use: Never used  Substance Use Topics   Alcohol use: Not Currently    Alcohol/week: 0.0 standard drinks of alcohol    Comment: rarely   Drug use: No     Medication list has been reviewed and updated.  Current Meds  Medication Sig   Acetaminophen (TYLENOL PO) Take by mouth as needed.   albuterol (VENTOLIN HFA) 108 (90 Base) MCG/ACT inhaler Inhale 2 puffs into the lungs every 6 (six) hours as needed for wheezing or shortness of breath.    Dextromethorphan Polistirex (ROBITUSSIN 12 HOUR COUGH PO) Take by mouth as needed.   letrozole (FEMARA) 2.5 MG tablet Take 7.5 mg by mouth daily.   metFORMIN (GLUCOPHAGE) 500 MG tablet Take 2 tablets (1,000 mg total) by mouth 2 (two) times daily with a meal.   montelukast (SINGULAIR) 10 MG tablet Take 10 mg by mouth at bedtime.   sertraline (ZOLOFT) 100 MG tablet Take 1.5 tablets (150 mg total) by mouth daily.   SUMAtriptan (IMITREX) 100 MG tablet TAKE 1 TABLET BY MOUTH EVERY 2 HOURS AS NEEDED FOR MIGRAINE. MAY REPEAT IN 2 HOUR IF HEADACHE PERSISTS OR RECURS   triamcinolone (NASACORT) 55 MCG/ACT AERO nasal inhaler Place 2 sprays into the nose daily.       06/14/2022   10:27 AM 02/17/2022   10:06 AM 07/07/2021   10:05 AM 07/30/2020    8:14 AM  GAD 7 : Generalized Anxiety Score  Nervous, Anxious, on Edge 2 2 1 3   Control/stop worrying 1 1 0 3  Worry too much - different things 3 2 1 3   Trouble relaxing 1 2 1 2   Restless 2 1 0 2  Easily annoyed or irritable 2 1 1 3   Afraid - awful might happen 1 0 0 3  Total GAD 7 Score 12 9 4 19   Anxiety Difficulty Not difficult at all Somewhat difficult  Somewhat difficult       06/14/2022   10:27 AM  Depression screen PHQ 2/9  Decreased Interest 1  Down, Depressed, Hopeless 1  PHQ - 2 Score 2  Altered sleeping 3  Tired, decreased energy 3  Change in appetite 3  Feeling bad or failure about yourself  1  Trouble concentrating 2  Moving slowly or fidgety/restless 2  Suicidal thoughts 0  PHQ-9 Score 16  Difficult doing work/chores Somewhat difficult    BP Readings from Last 3 Encounters:  06/14/22 114/84  02/17/22 124/82  12/30/21 (!) 115/52    Physical Exam Constitutional:      Appearance: Normal appearance.  HENT:     Right Ear: Tympanic membrane normal.     Left Ear: Tympanic membrane normal.     Nose:     Right Sinus: No maxillary sinus tenderness.     Left Sinus: No maxillary sinus tenderness.     Mouth/Throat:     Mouth:  Mucous membranes are moist.     Pharynx: Posterior oropharyngeal erythema present. No oropharyngeal exudate.     Comments: Voice very hoarse Neck:     Vascular: No carotid bruit.  Cardiovascular:     Rate and Rhythm: Normal rate and regular rhythm.  Pulmonary:     Effort: Pulmonary effort is normal.     Breath sounds: No wheezing or rhonchi.  Musculoskeletal:     Cervical back: Normal range of motion.  Lymphadenopathy:     Cervical: No cervical adenopathy.  Neurological:     Mental Status:  She is alert.     Wt Readings from Last 3 Encounters:  06/14/22 225 lb (102.1 kg)  02/17/22 209 lb (94.8 kg)  12/30/21 207 lb (93.9 kg)    BP 114/84   Pulse 92   Temp 98.3 F (36.8 C) (Oral)   Ht 5\' 8"  (1.727 m)   Wt 225 lb (102.1 kg)   SpO2 96%   BMI 34.21 kg/m   Assessment and Plan: 1. Pharyngitis, unspecified etiology Likely bacterial. Continue Tylenol every 6-8 hours Warm or cold liquids; lozenges for comfort - azithromycin (ZITHROMAX Z-PAK) 250 MG tablet; UAD  Dispense: 6 each; Refill: 0   Partially dictated using Animal nutritionist. Any errors are unintentional.  Bari Edward, MD [facility]  06/14/2022

## 2022-11-08 ENCOUNTER — Ambulatory Visit (INDEPENDENT_AMBULATORY_CARE_PROVIDER_SITE_OTHER): Payer: BC Managed Care – PPO | Admitting: Internal Medicine

## 2022-11-08 ENCOUNTER — Encounter: Payer: Self-pay | Admitting: Internal Medicine

## 2022-11-08 VITALS — BP 110/60 | HR 68 | Temp 97.5°F | Ht 68.0 in | Wt 230.0 lb

## 2022-11-08 DIAGNOSIS — J3089 Other allergic rhinitis: Secondary | ICD-10-CM | POA: Diagnosis not present

## 2022-11-08 DIAGNOSIS — H6993 Unspecified Eustachian tube disorder, bilateral: Secondary | ICD-10-CM | POA: Diagnosis not present

## 2022-11-08 MED ORDER — MONTELUKAST SODIUM 10 MG PO TABS: 10.0000 mg | ORAL_TABLET | Freq: Every day | ORAL | 1 refills | Status: DC

## 2022-11-08 NOTE — Progress Notes (Signed)
Date:  11/08/2022   Name:  Brianna Marshall   DOB:  01/01/1991   MRN:  [id number]   Chief Complaint: Ear Pain (Bilateral. Pt is currently pregnant. She said it feels like "water is sitting in both ears." She also has had some dizziness.)  Otalgia  There is pain in both ears. This is a new problem. The current episode started in the past 7 days. The problem has been unchanged. There has been no fever. The patient is experiencing no pain. Associated symptoms include hearing loss and vomiting (from pregnancy). Pertinent negatives include no coughing, ear discharge, headaches, rhinorrhea or sore throat. She has tried nothing for the symptoms.    Lab Results  Component Value Date   NA 141 12/17/2019   K 4.4 12/17/2019   CO2 20 12/17/2019   GLUCOSE 96 12/17/2019   BUN 8 12/17/2019   CREATININE 0.70 12/17/2019   CALCIUM 9.8 12/17/2019   GFRNONAA 119 12/17/2019   No results found for: "CHOL", "HDL", "LDLCALC", "LDLDIRECT", "TRIG", "CHOLHDL" Lab Results  Component Value Date   TSH 1.530 12/17/2019   Lab Results  Component Value Date   HGBA1C 5.4 12/17/2019   Lab Results  Component Value Date   WBC 8.5 12/17/2019   HGB 15.7 12/17/2019   HCT 46.0 12/17/2019   MCV 91 12/17/2019   PLT 315 12/17/2019   Lab Results  Component Value Date   ALT 17 12/17/2019   AST 16 12/17/2019   ALKPHOS 75 12/17/2019   BILITOT 0.2 12/17/2019   No results found for: "25OHVITD2", "25OHVITD3", "VD25OH"   Review of Systems  Constitutional:  Negative for chills, fatigue and fever.  HENT:  Positive for ear pain (fullness and pressure, minimal pain) and hearing loss. Negative for ear discharge, rhinorrhea, sinus pressure, sore throat and trouble swallowing.   Respiratory:  Negative for cough, chest tightness and wheezing.   Gastrointestinal:  Positive for vomiting (from pregnancy).  Neurological:  Positive for dizziness. Negative for light-headedness and headaches.  Psychiatric/Behavioral:   Positive for dysphoric mood. Negative for suicidal ideas. The patient is nervous/anxious.     Patient Active Problem List   Diagnosis Date Noted   Migraine without aura and without status migrainosus, not intractable 12/17/2019   Sleep disorder 12/17/2019   Alopecia 12/17/2019   Fatigue 12/17/2019   Anxiety 08/13/2018   Insulin resistance 01/02/2018   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   PCOS (polycystic ovarian syndrome) 07/08/2016   Prediabetes 07/08/2016   Vitamin D deficiency 07/08/2016    Allergies  Allergen Reactions   Shellfish Allergy Nausea And Vomiting and Nausea Only   Amoxicillin Rash and Other (See Comments)   Lidoderm [Lidocaine] Rash    Past Surgical History:  Procedure Laterality Date   ENDOSCOPIC CONCHA BULLOSA RESECTION Right 12/30/2021   Procedure: RIGHT CONCHA BULLOSA REDUCTION VIA NASAL ENDOSCOPY;  Surgeon: Vernie Murders, MD;  Location: [facility];  Service: ENT;  Laterality: Right;   INCISE AND DRAIN ABCESS Right 2011   Posterior Thigh   sebaceous cyst removal  07/2017   Back of neck   SEPTOPLASTY N/A 12/30/2021   Procedure: SEPTOPLASTY;  Surgeon: Vernie Murders, MD;  Location: [facility];  Service: ENT;  Laterality: N/A;   TURBINATE REDUCTION Right 12/30/2021   Procedure: PARTIAL EXCISION RIGHT INFERIOR TURBINATE;  Surgeon: Vernie Murders, MD;  Location: [facility];  Service: ENT;  Laterality: Right;   WISDOM TOOTH EXTRACTION      Social History  Tobacco Use   Smoking status: Never   Smokeless tobacco: Never  Vaping Use   Vaping Use: Never used  Substance Use Topics   Alcohol use: Not Currently    Alcohol/week: 0.0 standard drinks of alcohol    Comment: rarely   Drug use: No     Medication list has been reviewed and updated.  Current Meds  Medication Sig   Acetaminophen (TYLENOL PO) Take by mouth as needed.   albuterol (VENTOLIN HFA) 108 (90 Base) MCG/ACT inhaler Inhale 2 puffs into the lungs  every 6 (six) hours as needed for wheezing or shortness of breath.   metFORMIN (GLUCOPHAGE) 500 MG tablet Take 2 tablets (1,000 mg total) by mouth 2 (two) times daily with a meal.   montelukast (SINGULAIR) 10 MG tablet Take 10 mg by mouth at bedtime.   sertraline (ZOLOFT) 100 MG tablet Take 1.5 tablets (150 mg total) by mouth daily.   [DISCONTINUED] triamcinolone (NASACORT) 55 MCG/ACT AERO nasal inhaler Place 2 sprays into the nose daily.       06/14/2022   10:27 AM 02/17/2022   10:06 AM 07/07/2021   10:05 AM 07/30/2020    8:14 AM  GAD 7 : Generalized Anxiety Score  Nervous, Anxious, on Edge 2 2 1 3   Control/stop worrying 1 1 0 3  Worry too much - different things 3 2 1 3   Trouble relaxing 1 2 1 2   Restless 2 1 0 2  Easily annoyed or irritable 2 1 1 3   Afraid - awful might happen 1 0 0 3  Total GAD 7 Score 12 9 4 19   Anxiety Difficulty Not difficult at all Somewhat difficult  Somewhat difficult       06/14/2022   10:27 AM 02/17/2022   10:06 AM 07/07/2021   10:05 AM  Depression screen PHQ 2/9  Decreased Interest 1 0 0  Down, Depressed, Hopeless 1 1 0  PHQ - 2 Score 2 1 0  Altered sleeping 3 1 1   Tired, decreased energy 3 1 2   Change in appetite 3 1 2   Feeling bad or failure about yourself  1 0 0  Trouble concentrating 2 3 1   Moving slowly or fidgety/restless 2 1 1   Suicidal thoughts 0 0 0  PHQ-9 Score 16 8 7   Difficult doing work/chores Somewhat difficult Not difficult at all Not difficult at all    BP Readings from Last 3 Encounters:  11/08/22 110/60  06/14/22 114/84  02/17/22 124/82    Physical Exam Constitutional:      Appearance: She is well-developed.  HENT:     Right Ear: Ear canal and external ear normal. No middle ear effusion. Tympanic membrane is retracted. Tympanic membrane is not erythematous.     Left Ear: Ear canal and external ear normal.  No middle ear effusion. Tympanic membrane is retracted. Tympanic membrane is not erythematous.     Nose:     Right  Sinus: No maxillary sinus tenderness or frontal sinus tenderness.     Left Sinus: No maxillary sinus tenderness or frontal sinus tenderness.  Cardiovascular:     Rate and Rhythm: Normal rate and regular rhythm.     Heart sounds: Normal heart sounds.  Pulmonary:     Breath sounds: Normal breath sounds. No wheezing or rales.  Lymphadenopathy:     Cervical: No cervical adenopathy.  Neurological:     Mental Status: She is alert and oriented to person, place, and time.     Wt Readings from  Last 3 Encounters:  11/08/22 230 lb (104.3 kg)  06/14/22 225 lb (102.1 kg)  02/17/22 209 lb (94.8 kg)    BP 110/60   Pulse 68   Temp (!) 97.5 F (36.4 C) (Oral)   Ht 5\' 8"  (1.727 m)   Wt 230 lb (104.3 kg)   SpO2 97%   BMI 34.97 kg/m   Assessment and Plan: 1. Eustachian tube dysfunction, bilateral No evidence of infection but likely the cause of her symptoms Begin Flonase NS daily.  2. Environmental and seasonal allergies Will refill singulair as allergies could be contributing to ear symptoms  - montelukast (SINGULAIR) 10 MG tablet; Take 1 tablet (10 mg total) by mouth at bedtime.  Dispense: 90 tablet; Refill: 1   Partially dictated using . Any errors are unintentional.  Animal nutritionist, MD [facility]  11/08/2022

## 2022-11-16 ENCOUNTER — Other Ambulatory Visit: Payer: Self-pay | Admitting: Advanced Practice Midwife

## 2022-11-16 ENCOUNTER — Telehealth: Payer: Self-pay

## 2022-11-16 ENCOUNTER — Ambulatory Visit (INDEPENDENT_AMBULATORY_CARE_PROVIDER_SITE_OTHER): Payer: BC Managed Care – PPO

## 2022-11-16 DIAGNOSIS — Z3689 Encounter for other specified antenatal screening: Secondary | ICD-10-CM

## 2022-11-16 DIAGNOSIS — Z3480 Encounter for supervision of other normal pregnancy, unspecified trimester: Secondary | ICD-10-CM | POA: Insufficient documentation

## 2022-11-16 DIAGNOSIS — O219 Vomiting of pregnancy, unspecified: Secondary | ICD-10-CM

## 2022-11-16 DIAGNOSIS — Z369 Encounter for antenatal screening, unspecified: Secondary | ICD-10-CM

## 2022-11-16 DIAGNOSIS — O0990 Supervision of high risk pregnancy, unspecified, unspecified trimester: Secondary | ICD-10-CM | POA: Insufficient documentation

## 2022-11-16 MED ORDER — ONDANSETRON 4 MG PO TBDP: 4.0000 mg | ORAL_TABLET | Freq: Four times a day (QID) | ORAL | 2 refills | Status: DC | PRN

## 2022-11-16 NOTE — Progress Notes (Signed)
Zofran odt rx sent.

## 2022-11-16 NOTE — Addendum Note (Signed)
Addended by: Donnetta Hail on: 11/16/2022 04:08 PM   Modules accepted: Orders

## 2022-11-16 NOTE — Progress Notes (Signed)
New OB Intake  I connected with  [name] on 11/16/22 at  2:15 PM EST by telephone and verified that I am speaking with the correct person using two identifiers. Nurse is located at [facility] and pt is located at home.  I explained I am completing New OB Intake today. We discussed her EDD of 06/03/23 that is based on dating [place]. Pt is G2/P1001. I reviewed her allergies, medications, Medical/Surgical/OB history, and appropriate screenings. Based on history, this is a/an pregnancy uncomplicated .   Patient Active Problem List   Diagnosis Date Noted   Supervision of other normal pregnancy, antepartum 11/16/2022   Migraine without aura and without status migrainosus, not intractable 12/17/2019   Sleep disorder 12/17/2019   Alopecia 12/17/2019   Fatigue 12/17/2019   Anxiety 08/13/2018   Insulin resistance 01/02/2018   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   PCOS (polycystic ovarian syndrome) 07/08/2016   Prediabetes 07/08/2016   Vitamin D deficiency 07/08/2016    Concerns addressed today: None.  Delivery Plans:  Plans to deliver at [facility].  Anatomy US Explained Anatomy US will be at 20 weeks.  Labs Discussed genetic screening with patient. Patient desires genetic testing to be drawn with new OB labs. Discussed possible labs to be drawn at new OB appointment.  COVID Vaccine Patient has had COVID vaccine.   Social Determinants of Health Food Insecurity: denies food insecurity Transportation: Patient denies transportation needs. Childcare: Discussed no children allowed at ultrasound appointments.   First visit review I reviewed new OB appt with pt. I explained she will have bloodwork and pap smear/pelvic exam if indicated. Explained pt will be seen by Nicholaus Bloom, MD at first visit; encounter routed to appropriate provider.   Donnetta Hail, [facility] 11/16/2022  2:43 PM

## 2022-11-16 NOTE — Telephone Encounter (Signed)
Brianna Marshall, can you call in for this NOB a dissolvable tablet Rx for Zofran. She says she has a hard time keeping her zofran down. She has a cousin who is a Associate Professor and she advised her to request a dissolvable tablet and that is not in our protocol. Thank you so much.

## 2022-11-17 NOTE — Telephone Encounter (Signed)
Pt aware. Has tried the unisom and B6 and could not keep it down.

## 2022-11-17 NOTE — Telephone Encounter (Signed)
Called pt, no answer, LVMTRC. 

## 2022-11-18 ENCOUNTER — Other Ambulatory Visit: Payer: BC Managed Care – PPO

## 2022-11-18 DIAGNOSIS — Z3480 Encounter for supervision of other normal pregnancy, unspecified trimester: Secondary | ICD-10-CM

## 2022-11-19 LAB — CBC/D/PLT+RPR+RH+ABO+RUBIGG...
Antibody Screen: NEGATIVE
Basophils Absolute: 0 10*3/uL (ref 0.0–0.2)
Basos: 0 %
EOS (ABSOLUTE): 0.1 10*3/uL (ref 0.0–0.4)
Eos: 1 %
HCV Ab: NONREACTIVE
HIV Screen 4th Generation wRfx: NONREACTIVE
Hematocrit: 38.9 % (ref 34.0–46.6)
Hemoglobin: 13.5 g/dL (ref 11.1–15.9)
Hepatitis B Surface Ag: NEGATIVE
Immature Grans (Abs): 0 10*3/uL (ref 0.0–0.1)
Immature Granulocytes: 0 %
Lymphocytes Absolute: 1.7 10*3/uL (ref 0.7–3.1)
Lymphs: 22 %
MCH: 30.8 pg (ref 26.6–33.0)
MCHC: 34.7 g/dL (ref 31.5–35.7)
MCV: 89 fL (ref 79–97)
Monocytes Absolute: 0.4 10*3/uL (ref 0.1–0.9)
Monocytes: 5 %
Neutrophils Absolute: 5.2 10*3/uL (ref 1.4–7.0)
Neutrophils: 72 %
Platelets: 233 10*3/uL (ref 150–450)
RBC: 4.39 x10E6/uL (ref 3.77–5.28)
RDW: 13.1 % (ref 11.7–15.4)
RPR Ser Ql: NONREACTIVE
Rh Factor: POSITIVE
Rubella Antibodies, IGG: 3.4 index (ref >0.99)
Varicella zoster IgG: 1274 index (ref >165)
WBC: 7.4 10*3/uL (ref 3.4–10.8)

## 2022-11-19 LAB — HCV INTERPRETATION

## 2022-11-30 ENCOUNTER — Telehealth: Payer: Self-pay

## 2022-11-30 ENCOUNTER — Other Ambulatory Visit: Payer: Self-pay | Admitting: Advanced Practice Midwife

## 2022-11-30 DIAGNOSIS — O219 Vomiting of pregnancy, unspecified: Secondary | ICD-10-CM

## 2022-11-30 MED ORDER — PROMETHAZINE HCL 25 MG RE SUPP: 25.0000 mg | Freq: Three times a day (TID) | RECTAL | 1 refills | Status: DC | PRN

## 2022-11-30 NOTE — Telephone Encounter (Signed)
Patient called triage line stating she's been throwing up a lot. She's already taking Zofran.  I advised patient to go to the ED to be checked out, Patient tested positive for COVID last night per patient.  Patient stated she can't afford to go to the ED.Marland Kitchen  Please advise

## 2022-11-30 NOTE — Progress Notes (Signed)
Rx phenergan suppositories sent. Tested positive for Covid.

## 2022-12-05 ENCOUNTER — Encounter: Payer: BC Managed Care – PPO | Admitting: Obstetrics & Gynecology

## 2022-12-13 ENCOUNTER — Other Ambulatory Visit: Payer: Self-pay | Admitting: Internal Medicine

## 2022-12-13 DIAGNOSIS — F53 Postpartum depression: Secondary | ICD-10-CM

## 2022-12-13 DIAGNOSIS — F3341 Major depressive disorder, recurrent, in partial remission: Secondary | ICD-10-CM

## 2022-12-13 NOTE — Telephone Encounter (Signed)
Medication Refill - Medication: Zofran    Has the patient contacted their pharmacy? No.pt says she is out of refills (Agent: If no, request that the patient contact the pharmacy for the refill. If patient does not wish to contact the pharmacy document the reason why and proceed with request.) (Agent: If yes, when and what did the pharmacy advise?)  Preferred Pharmacy (with phone number or street name): [facility] in [place] Has the patient been seen for an appointment in the last year OR does the patient have an upcoming appointment? Yes.    Agent: Please be advised that RX refills may take up to 3 business days. We ask that you follow-up with your pharmacy.   Pt is going out of town today.

## 2022-12-15 NOTE — Telephone Encounter (Signed)
Requested medication (s) are due for refill today: Yes  Requested medication (s) are on the active medication list: Yes  Last refill:  05/11/20  Future visit scheduled:   Notes to clinic:  Prescription expired. Last refilled by Dr. Jean Rosenthal.    Requested Prescriptions  Pending Prescriptions Disp Refills   sertraline (ZOLOFT) 100 MG tablet 45 tablet 2    Sig: Take 1.5 tablets (150 mg total) by mouth daily.     Psychiatry:  Antidepressants - SSRI - sertraline Failed - 12/13/2022  3:39 PM      Failed - AST in normal range and within 360 days    AST  Date Value Ref Range Status  12/17/2019 16 0 - 40 IU/L Final         Failed - ALT in normal range and within 360 days    ALT  Date Value Ref Range Status  12/17/2019 17 0 - 32 IU/L Final         Passed - Completed PHQ-2 or PHQ-9 in the last 360 days      Passed - Valid encounter within last 6 months    Recent Outpatient Visits           1 month ago Eustachian tube dysfunction, bilateral   [facility], Nyoka Cowden, MD   6 months ago Pharyngitis, unspecified etiology   [place] [facility], Nyoka Cowden, MD   10 months ago Recurrent major depressive disorder, in partial remission [facility])   [place] [facility], Nyoka Cowden, MD   1 year ago Acute non-recurrent maxillary sinusitis   [place] [facility], Nyoka Cowden, MD   2 years ago Mild intermittent asthma with acute exacerbation   [place] [facility], MD

## 2022-12-19 NOTE — L&D Delivery Note (Signed)
Date of delivery: 05/16/23 Estimated Date of Delivery: 06/03/23 EGA: [redacted]w[redacted]d  Hospital Course summary: Brianna Marshall is a 32 y.o. G2P1001 @ [redacted]w[redacted]d who was admitted on 05/16/2023 for an IOL secondary to preeclampsia, GDMA1 and polyhydramnios. She received one dose of cytotec ( PO and PV), followed by pitocon.   Delivery Note At 4:32 PM a viable female was delivered via Vaginal, Spontaneous (Presentation: Right Occiput Anterior).  APGAR: 7, 8; weight 7 lb 7.2 oz (3380 g).   Placenta status: Spontaneous, Intact.  Cord: 3 vessels with the following complications: PPH  Anesthesia: Epidural Episiotomy: None Lacerations: 2nd degree;1st degree;Labial;Vaginal Suture Repair: 3.0 vicryl Est. Blood Loss (mL):  Mom to postpartum.  Baby to Couplet care / Skin to Skin.  Brianna Marshall 05/16/2023, 8:40 PM    Delivery Narrative  Was called to the room as patient called out stating she could feel babies head. When I entered the room and we removed the peanut ball about an inch of babies head was visible at the perineum, with her next contraction Brianna Marshall seeminly effortlessly birthed her baby. Brianna Marshall was complete at 1630. She delivered a vigorous female infant named Wren at [phone]. Apgars 7, 8, 9. The head followed by shoulders which were delivered without difficulty, and the rest of the body. Baby was immediately placed skin to skin on mother's chest. No Nuchal cord was noted. Delayed cord clamping with cord clamped by CNM and cut by FOB. Cord blood was obtained. Placenta was delivered at 1637, primarily by maternal efforts and with some slight cord traction. Placenta was intact, mec stained, Shultz mechanism, 3 VC. Perineum inspected and found to have a left vaginal second degree tear and right labial first degree tear. Repaired with 3-0 vicryl in the usual fashion with epidural for anesthesia. Well tolerated by patient. AMSTL with IV pitocin per unit  protocol.   The repair took sometime. Upon finishing the current EBL was . Most of this was from the two lacerations. Bleeding had ceased and fundus was firm but then she started trickling some. Fundus was boggy, performed fundal massage and also gave cytotec PR for bleeding. Bleeding improved temporarily. Then some intermittent gushing resumed. TXA was then given and manual pass for removal of clots was performed and an additional of clots was removed, bringing the total EBL to . After this bleeding became scant. Stat CBC was ordered. Patient remained asymptomatic. Did inform Dr. Jennell Corner regarding patient status and updated with CBC results once available.

## 2022-12-23 ENCOUNTER — Encounter: Payer: Self-pay | Admitting: Certified Nurse Midwife

## 2022-12-23 ENCOUNTER — Ambulatory Visit (INDEPENDENT_AMBULATORY_CARE_PROVIDER_SITE_OTHER): Payer: BC Managed Care – PPO | Admitting: Certified Nurse Midwife

## 2022-12-23 ENCOUNTER — Other Ambulatory Visit (HOSPITAL_COMMUNITY)
Admission: RE | Admit: 2022-12-23 | Discharge: 2022-12-23 | Disposition: A | Discharge status: Home / Self Care | Payer: BC Managed Care – PPO | Source: Ambulatory Visit | Attending: Obstetrics & Gynecology | Admitting: Obstetrics & Gynecology

## 2022-12-23 VITALS — BP 119/80 | HR 116 | Wt 227.2 lb

## 2022-12-23 DIAGNOSIS — Z124 Encounter for screening for malignant neoplasm of cervix: Secondary | ICD-10-CM

## 2022-12-23 DIAGNOSIS — Z3A16 16 weeks gestation of pregnancy: Secondary | ICD-10-CM | POA: Insufficient documentation

## 2022-12-23 DIAGNOSIS — O09292 Supervision of pregnancy with other poor reproductive or obstetric history, second trimester: Secondary | ICD-10-CM | POA: Diagnosis not present

## 2022-12-23 LAB — POCT URINALYSIS DIPSTICK OB
Bilirubin, UA: NEGATIVE
Blood, UA: NEGATIVE
Glucose, UA: NEGATIVE
Ketones, UA: NEGATIVE
Leukocytes, UA: NEGATIVE
Nitrite, UA: NEGATIVE
Spec Grav, UA: 1.025 (ref 1.010–1.025)
Urobilinogen, UA: 0.2 E.U./dL
pH, UA: 5 (ref 5.0–8.0)

## 2022-12-23 MED ORDER — ONDANSETRON HCL 4 MG PO TABS: 4.0000 mg | ORAL_TABLET | Freq: Three times a day (TID) | ORAL | 1 refills | Status: DC | PRN

## 2022-12-23 MED ORDER — ASPIRIN 81 MG PO TBEC: 81.0000 mg | DELAYED_RELEASE_TABLET | Freq: Every day | ORAL | 12 refills | Status: DC

## 2022-12-23 NOTE — Progress Notes (Signed)
NEW OB HISTORY AND PHYSICAL  SUBJECTIVE:       Brianna Marshall is a 32 y.o. G46P1001 female, No LMP recorded. Patient is pregnant., Estimated Date of Delivery: 06/03/23, [redacted]w[redacted]d, presents today for establishment of Prenatal Care. She has no unusual complaints. Previous pregnancy GDM diet controled SVD baby 9lbs 2oz.   Social  Relationship: married Living with spouse , son, and niece Work: summer camp Exercise: very active with job Alcohol none, Drugs, none, smoking/vape-none    Gynecologic History No LMP recorded. Patient is pregnant. Normal Contraception: none Last Pap: 10/09/2020. Results were: HPV neg/ASCUS  Obstetric History OB History  Gravida Para Term Preterm AB Living  2 1 1     1   SAB IAB Ectopic Multiple Live Births        0 1    # Outcome Date GA Lbr Len/2nd Weight Sex Delivery Anes PTL Lv  2 Current           1 Term 07/30/18 [redacted]w[redacted]d / 01:12 9 lb 5.2 oz (4.23 kg) M Vag-Spont EPI  LIV    Past Medical History:  Diagnosis Date   Anemia in pregnancy 05/30/2018   Antepartum mild preeclampsia, third trimester 07/30/2018   Asthma    COVID-19 02/2020   Depression    Family history of pancreatic cancer    Gestational diabetes 06/11/2018   Gestational diabetes mellitus (GDM) affecting third pregnancy 07/29/2018   Headache, migraine 07/14/2015   approx 2x/month   Hyperemesis 06/05/2018   Infertility, anovulation 03/10/2017   Nausea/vomiting in pregnancy 01/17/2018   Obesity    PCOS (polycystic ovarian syndrome) 07/08/2016   Prediabetes 07/08/2016   Hgb 5.8  04/2015    Supervision of high risk pregnancy, antepartum 01/02/2018   Clinic [facility] Prenatal Labs Dating Early [place] Blood type: O/Positive/-- (01/29 1038)  Genetic Screen 1 Screen: Neg Antibody:Negative (01/29 1038) Anatomic [place] Normal, anterior placenta,  Rubella: <0.90 (01/29 1038) Varicella: Imm GTT Early: 77 Third trimester:  RPR: Non Reactive (01/29 1038)  Rhogam n/a HBsAg: Negative (01/29 1038)  TDaP  vaccine        05/30/2018    Flu Shot: 09/2017 HIV: Non Reac   Vertigo    for short time after covid   Vitamin D deficiency 07/08/2016   18.7 in 04/2015     Past Surgical History:  Procedure Laterality Date   ENDOSCOPIC CONCHA BULLOSA RESECTION Right 12/30/2021   Procedure: RIGHT CONCHA BULLOSA REDUCTION VIA NASAL ENDOSCOPY;  Surgeon: Margaretha Sheffield, MD;  Location: [place];  Service: ENT;  Laterality: Right;   INCISE AND DRAIN ABCESS Right 2011   Posterior Thigh   sebaceous cyst removal  07/2017   Back of neck   SEPTOPLASTY N/A 12/30/2021   Procedure: SEPTOPLASTY;  Surgeon: Margaretha Sheffield, MD;  Location: [place];  Service: ENT;  Laterality: N/A;   TURBINATE REDUCTION Right 12/30/2021   Procedure: PARTIAL EXCISION RIGHT INFERIOR TURBINATE;  Surgeon: Margaretha Sheffield, MD;  Location: [place];  Service: ENT;  Laterality: Right;   WISDOM TOOTH EXTRACTION      Current Outpatient Medications on File Prior to Visit  Medication Sig Dispense Refill   Acetaminophen (TYLENOL PO) Take by mouth as needed.     albuterol (VENTOLIN HFA) 108 (90 Base) MCG/ACT inhaler Inhale 2 puffs into the lungs every 6 (six) hours as needed for wheezing or shortness of breath. 6.7 g 11   metFORMIN (GLUCOPHAGE) 1000 MG tablet Take 1,000 mg by mouth 2 (two) times daily.  montelukast (SINGULAIR) 10 MG tablet Take 1 tablet (10 mg total) by mouth at bedtime. 90 tablet 1   promethazine (PHENERGAN) 25 MG suppository Place 1 suppository (25 mg total) rectally every 8 (eight) hours as needed for nausea or vomiting. 12 each 1   sertraline (ZOLOFT) 100 MG tablet Take 1.5 tablets (150 mg total) by mouth daily. 45 tablet 2   ondansetron (ZOFRAN) 4 MG tablet Take by mouth. (Patient not taking: Reported on 12/23/2022)     ondansetron (ZOFRAN-ODT) 4 MG disintegrating tablet Take 1 tablet (4 mg total) by mouth every 6 (six) hours as needed for nausea. (Patient not taking: Reported on 12/23/2022) 20 tablet 2    No current facility-administered medications on file prior to visit.    Allergies  Allergen Reactions   Shellfish Allergy Nausea And Vomiting and Nausea Only   Amoxicillin Other (See Comments) and Rash   Lidocaine Rash    topical    Social History   Socioeconomic History   Marital status: Married    Spouse name: Georgina Snell   Number of children: 1   Years of education: 12   Highest education level: High school graduate  Occupational History   Occupation: Forensic scientist  Tobacco Use   Smoking status: Never   Smokeless tobacco: Never  Vaping Use   Vaping Use: Never used  Substance and Sexual Activity   Alcohol use: Not Currently    Alcohol/week: 0.0 standard drinks of alcohol    Comment: rarely   Drug use: No   Sexual activity: Yes    Partners: Male    Birth control/protection: None  Other Topics Concern   Not on file  Social History Narrative   Not on file   Social Determinants of Health   Financial Resource Strain: Medium Risk (11/16/2022)   Overall Financial Resource Strain (CARDIA)    Difficulty of Paying Living Expenses: Somewhat hard  Food Insecurity: Food Insecurity Present (11/16/2022)   Hunger Vital Sign    Worried About [place] in the Last Year: Sometimes true    Ran Out of Food in the Last Year: Sometimes true  Transportation Needs: No Transportation Needs (06/14/2022)   PRAPARE - Hydrologist (Medical): No    Lack of Transportation (Non-Medical): No  Physical Activity: Insufficiently Active (11/16/2022)   Exercise Vital Sign    Days of Exercise per Week: 3 days    Minutes of Exercise per Session: 20 min  Stress: No Stress Concern Present (11/16/2022)   [place]    Feeling of Stress : Only a little  Social Connections: Moderately Isolated (11/16/2022)   Social Connection and Isolation Panel [NHANES]    Frequency of Communication with Friends  and Family: More than three times a week    Frequency of Social Gatherings with Friends and Family: Never    Attends Religious Services: Never    Marine scientist or Organizations: No    Attends Archivist Meetings: Never    Marital Status: Married  Human resources officer Violence: Not At Risk (11/16/2022)   Humiliation, Afraid, Rape, and Kick questionnaire    Fear of Current or Ex-Partner: No    Emotionally Abused: No    Physically Abused: No    Sexually Abused: No    Family History  Problem Relation Age of Onset   Osteoporosis Mother    Alcohol abuse Father    Drug abuse Father  Healthy Sister    ADD / ADHD Brother    OCD Brother    Diabetes Maternal Grandmother    Pancreatic cancer Maternal Grandmother 25   Depression Paternal Grandmother    COPD Paternal Grandmother    Alcohol abuse Maternal Aunt    Bipolar disorder Maternal Aunt    Diabetes Maternal Aunt    Breast cancer Maternal Aunt 61       BRCA neg   Diabetes Maternal Uncle    Prostate cancer Maternal Uncle     The following portions of the patient's history were reviewed and updated as appropriate: allergies, current medications, past OB history, past medical history, past surgical history, past family history, past social history, and problem list.    OBJECTIVE: Initial Physical Exam (New OB)  GENERAL APPEARANCE: alert, well appearing, in no apparent distress, oriented to person, place and time HEAD: normocephalic, atraumatic MOUTH: mucous membranes moist, pharynx normal without lesions THYROID: no thyromegaly or masses present BREASTS: no masses noted, no significant tenderness, no palpable axillary nodes, no skin changes LUNGS: clear to auscultation, no wheezes, rales or rhonchi, symmetric air entry HEART: regular rate and rhythm, no murmurs ABDOMEN: soft, nontender, nondistended, no abnormal masses, no epigastric pain EXTREMITIES: no redness or tenderness in the calves or thighs SKIN:  normal coloration and turgor, no rashes LYMPH NODES: no adenopathy palpable NEUROLOGIC: alert, oriented, normal speech, no focal findings or movement disorder noted  PELVIC EXAM EXTERNAL GENITALIA: normal appearing vulva with no masses, tenderness or lesions VAGINA: no abnormal discharge or lesions CERVIX: no lesions or cervical motion tenderness, pap collected UTERUS: gravid ADNEXA: no masses palpable and nontender OB EXAM PELVIMETRY: appears adequate RECTUM: exam not indicated  ASSESSMENT: Normal pregnancy  PLAN: Prenatal care See ordersNew OB counseling: The patient has been given an overview regarding routine prenatal care. Recommendations regarding diet, weight gain, and exercise in pregnancy were given. Prenatal testing, optional genetic testing, carrier screening, and ultrasound use in pregnancy were reviewed.  Benefits of Breast Feeding were discussed. The patient is encouraged to consider nursing her baby post partum. She plans to bottle feed. Discussed current medications, zoloft use in pregnancy. PT states she is aware of potential risk to the baby. Follow up as soon as able for lab-early glucose screen, 4 wks for ROB & anatomy scan.   Philip Aspen, CNM

## 2022-12-23 NOTE — Patient Instructions (Signed)
Prenatal Care Prenatal care is health care during pregnancy. It helps you and your unborn baby (fetus) stay as healthy as possible. Prenatal care may be provided by a midwife, a family practice doctor, a mid-level practitioner (nurse practitioner or physician assistant), or a childbirth and pregnancy doctor (obstetrician). How does this affect me? During pregnancy, you will be closely monitored for any new conditions that might develop. To lower your risk of pregnancy complications, you and your health care provider will talk about any underlying conditions you have. How does this affect my baby? Early and consistent prenatal care increases the chance that your baby will be healthy during pregnancy. Prenatal care lowers the risk that your baby will be: Born early (prematurely). Smaller than expected at birth (small for gestational age). What can I expect at the first prenatal care visit? Your first prenatal care visit will likely be the longest. You should schedule your first prenatal care visit as soon as you know that you are pregnant. Your first visit is a good time to talk about any questions or concerns you have about pregnancy. Medical history At your visit, you and your health care provider will talk about your medical history, including: Any past pregnancies. Your family's medical history. Medical history of the baby's father. Any long-term (chronic) health conditions you have and how you manage them. Any surgeries or procedures you have had. Any current over-the-counter or prescription medicines, herbs, or supplements that you are taking. Other factors that could pose a risk to your baby, including: Exposure to harmful chemicals or radiation at work or at home. Any substance use, including tobacco, alcohol, and drug use. Your home setting and your stress levels, including: Exposure to abuse or violence. Household financial strain. Your daily health habits, including diet and  exercise. Tests and screenings Your health care provider will: Measure your weight, height, and blood pressure. Do a physical exam, including a pelvic and breast exam. Perform blood tests and urine tests to check for: Urinary tract infection. Sexually transmitted infections (STIs). Low iron levels in your blood (anemia). Blood type and certain proteins on red blood cells (Rh antibodies). Infections and immunity to viruses, such as hepatitis B and rubella. HIV (human immunodeficiency virus). Discuss your options for genetic screening. Tips about staying healthy Your health care provider will also give you information about how to keep yourself and your baby healthy, including: Nutrition and taking vitamins. Physical activity. How to manage pregnancy symptoms such as nausea and vomiting (morning sickness). Infections and substances that may be harmful to your baby and how to avoid them. Food safety. Dental care. Working. Travel. Warning signs to watch for and when to call your health care provider. How often will I have prenatal care visits? After your first prenatal care visit, you will have regular visits throughout your pregnancy. The visit schedule is often as follows: Up to week 28 of pregnancy: once every 4 weeks. 28-36 weeks: once every 2 weeks. After 36 weeks: every week until delivery. Some women may have visits more or less often depending on any underlying health conditions and the health of the baby. Keep all follow-up and prenatal care visits. This is important. What happens during routine prenatal care visits? Your health care provider will: Measure your weight and blood pressure. Check for fetal heart sounds. Measure the height of your uterus in your abdomen (fundal height). This may be measured starting around week 20 of pregnancy. Check the position of your baby inside your uterus. Ask questions   about your diet, sleeping patterns, and whether you can feel the baby  move. Review warning signs to watch for and signs of labor. Ask about any pregnancy symptoms you are having and how you are dealing with them. Symptoms may include: Headaches. Nausea and vomiting. Vaginal discharge. Swelling. Fatigue. Constipation. Changes in your vision. Feeling persistently sad or anxious. Any discomfort, including back or pelvic pain. Bleeding or spotting. Make a list of questions to ask your health care provider at your routine visits. What tests might I have during prenatal care visits? You may have blood, urine, and imaging tests throughout your pregnancy, such as: Urine tests to check for glucose, protein, or signs of infection. Glucose tests to check for a form of diabetes that can develop during pregnancy (gestational diabetes mellitus). This is usually done around week 24 of pregnancy. Ultrasounds to check your baby's growth and development, to check for birth defects, and to check your baby's well-being. These can also help to decide when you should deliver your baby. A test to check for group B strep (GBS) infection. This is usually done around week 36 of pregnancy. Genetic testing. This may include blood, fluid, or tissue sampling, or imaging tests, such as an ultrasound. Some genetic tests are done during the first trimester and some are done during the second trimester. What else can I expect during prenatal care visits? Your health care provider may recommend getting certain vaccines during pregnancy. These may include: A yearly flu shot (annual influenza vaccine). This is especially important if you will be pregnant during flu season. Tdap (tetanus, diphtheria, pertussis) vaccine. Getting this vaccine during pregnancy can protect your baby from whooping cough (pertussis) after birth. This vaccine may be recommended between weeks 27 and 36 of pregnancy. A COVID-19 vaccine. Later in your pregnancy, your health care provider may give you information  about: Childbirth and breastfeeding classes. Choosing a health care provider for your baby. Umbilical cord banking. Breastfeeding. Birth control after your baby is born. The hospital labor and delivery unit and how to set up a tour. Registering at the hospital before you go into labor. Where to find more information Office on Women's Health: womenshealth.gov American Pregnancy Association: americanpregnancy.org March of Dimes: marchofdimes.org Summary Prenatal care helps you and your baby stay as healthy as possible during pregnancy. Your first prenatal care visit will most likely be the longest. You will have visits and tests throughout your pregnancy to monitor your health and your baby's health. Bring a list of questions to your visits to ask your health care provider. Make sure to keep all follow-up and prenatal care visits. This information is not intended to replace advice given to you by your health care provider. Make sure you discuss any questions you have with your health care provider. Document Revised: 09/17/2020 Document Reviewed: 09/17/2020 Elsevier Patient Education  2023 Elsevier Inc.  

## 2022-12-25 LAB — CULTURE, OB URINE

## 2022-12-25 LAB — URINE CULTURE, OB REFLEX

## 2022-12-26 ENCOUNTER — Other Ambulatory Visit: Payer: Self-pay | Admitting: Licensed Practical Nurse

## 2022-12-26 DIAGNOSIS — R7309 Other abnormal glucose: Secondary | ICD-10-CM

## 2022-12-27 ENCOUNTER — Other Ambulatory Visit: Payer: BC Managed Care – PPO

## 2022-12-27 DIAGNOSIS — Z369 Encounter for antenatal screening, unspecified: Secondary | ICD-10-CM

## 2022-12-28 ENCOUNTER — Other Ambulatory Visit: Payer: Self-pay | Admitting: Licensed Practical Nurse

## 2022-12-28 DIAGNOSIS — O99810 Abnormal glucose complicating pregnancy: Secondary | ICD-10-CM

## 2022-12-28 LAB — GLUCOSE, 1 HOUR GESTATIONAL: Gestational Diabetes Screen: 169 mg/dL — ABNORMAL HIGH (ref 70–139)

## 2022-12-29 LAB — CYTOLOGY - PAP
Comment: NEGATIVE
Diagnosis: NEGATIVE
Diagnosis: REACTIVE
High risk HPV: NEGATIVE

## 2022-12-30 ENCOUNTER — Telehealth: Payer: Self-pay | Admitting: Licensed Practical Nurse

## 2022-12-30 NOTE — Telephone Encounter (Signed)
Early 1 hour elevated, call to pt. Aware of results and next step.  Pt will call to arrange 3 hour glucose test Orders in  Brianna Marshall, [facility]  12/30/2022 2:07 PM

## 2023-01-03 ENCOUNTER — Other Ambulatory Visit: Payer: BC Managed Care – PPO

## 2023-01-03 DIAGNOSIS — O99810 Abnormal glucose complicating pregnancy: Secondary | ICD-10-CM

## 2023-01-04 LAB — GESTATIONAL GLUCOSE TOLERANCE
Glucose, Fasting: 93 mg/dL (ref 70–94)
Glucose, GTT - 1 Hour: 168 mg/dL (ref 70–179)
Glucose, GTT - 2 Hour: 184 mg/dL — ABNORMAL HIGH (ref 70–154)
Glucose, GTT - 3 Hour: 137 mg/dL (ref 70–139)

## 2023-01-10 ENCOUNTER — Other Ambulatory Visit: Payer: Self-pay | Admitting: Certified Nurse Midwife

## 2023-01-19 ENCOUNTER — Encounter: Payer: BC Managed Care – PPO | Admitting: Licensed Practical Nurse

## 2023-01-19 ENCOUNTER — Other Ambulatory Visit: Payer: BC Managed Care – PPO

## 2023-01-23 ENCOUNTER — Ambulatory Visit
Admission: RE | Admit: 2023-01-23 | Discharge: 2023-01-23 | Disposition: A | Discharge status: Home / Self Care | Payer: BC Managed Care – PPO | Source: Ambulatory Visit | Attending: Certified Nurse Midwife | Admitting: Certified Nurse Midwife

## 2023-01-23 DIAGNOSIS — Z3A16 16 weeks gestation of pregnancy: Secondary | ICD-10-CM

## 2023-01-23 DIAGNOSIS — Z3689 Encounter for other specified antenatal screening: Secondary | ICD-10-CM | POA: Insufficient documentation

## 2023-01-23 DIAGNOSIS — Z3A22 22 weeks gestation of pregnancy: Secondary | ICD-10-CM | POA: Diagnosis not present

## 2023-01-23 DIAGNOSIS — Z3482 Encounter for supervision of other normal pregnancy, second trimester: Secondary | ICD-10-CM | POA: Insufficient documentation

## 2023-01-24 ENCOUNTER — Encounter: Payer: Self-pay | Admitting: Obstetrics and Gynecology

## 2023-01-24 ENCOUNTER — Ambulatory Visit (INDEPENDENT_AMBULATORY_CARE_PROVIDER_SITE_OTHER): Payer: BC Managed Care – PPO | Admitting: Obstetrics and Gynecology

## 2023-01-24 VITALS — BP 113/80 | HR 118 | Wt 219.1 lb

## 2023-01-24 DIAGNOSIS — Z3480 Encounter for supervision of other normal pregnancy, unspecified trimester: Secondary | ICD-10-CM

## 2023-01-24 DIAGNOSIS — Z3A21 21 weeks gestation of pregnancy: Secondary | ICD-10-CM

## 2023-01-24 DIAGNOSIS — O219 Vomiting of pregnancy, unspecified: Secondary | ICD-10-CM

## 2023-01-24 DIAGNOSIS — Z3482 Encounter for supervision of other normal pregnancy, second trimester: Secondary | ICD-10-CM

## 2023-01-24 LAB — POCT URINALYSIS DIPSTICK OB
Bilirubin, UA: NEGATIVE
Blood, UA: NEGATIVE
Glucose, UA: NEGATIVE
Leukocytes, UA: NEGATIVE
Nitrite, UA: NEGATIVE
Spec Grav, UA: 1.02 (ref 1.010–1.025)
Urobilinogen, UA: 0.2 E.U./dL
pH, UA: 6 (ref 5.0–8.0)

## 2023-01-24 MED ORDER — ONDANSETRON 4 MG PO TBDP: 4.0000 mg | ORAL_TABLET | Freq: Four times a day (QID) | ORAL | 2 refills | Status: DC | PRN

## 2023-01-24 NOTE — Progress Notes (Signed)
ROB. Patient states daily fetal movement, no pain or pressure at this time. She states daily nausea, is usually controlled with Zofran. Patient reports experiencing bilateral calf pain, states this morning when getting out of bed there was increased pain. Anatomy ultrasound was yesterday.

## 2023-01-24 NOTE — Progress Notes (Signed)
ROB: Doing well.  Feels daily movement.  Discussed 1 hour GCT and possible 3-hour GTT in the future.  Patient knows how to keep track of her sugars and does not want to go to lifestyles even if she is a gestational diabetic.  I have okayed this.  She still feels nausea and vomiting daily when not taking Zofran.  Would like a refill of her Zofran.

## 2023-02-22 ENCOUNTER — Ambulatory Visit (INDEPENDENT_AMBULATORY_CARE_PROVIDER_SITE_OTHER): Payer: BC Managed Care – PPO | Admitting: Obstetrics & Gynecology

## 2023-02-22 VITALS — BP 120/80 | Wt 217.0 lb

## 2023-02-22 DIAGNOSIS — Z8632 Personal history of gestational diabetes: Secondary | ICD-10-CM | POA: Insufficient documentation

## 2023-02-22 DIAGNOSIS — O26892 Other specified pregnancy related conditions, second trimester: Secondary | ICD-10-CM

## 2023-02-22 DIAGNOSIS — Z3480 Encounter for supervision of other normal pregnancy, unspecified trimester: Secondary | ICD-10-CM

## 2023-02-22 DIAGNOSIS — O09292 Supervision of pregnancy with other poor reproductive or obstetric history, second trimester: Secondary | ICD-10-CM

## 2023-02-22 DIAGNOSIS — O99210 Obesity complicating pregnancy, unspecified trimester: Secondary | ICD-10-CM | POA: Insufficient documentation

## 2023-02-22 DIAGNOSIS — E669 Obesity, unspecified: Secondary | ICD-10-CM

## 2023-02-22 DIAGNOSIS — Z3A25 25 weeks gestation of pregnancy: Secondary | ICD-10-CM

## 2023-02-22 DIAGNOSIS — O99212 Obesity complicating pregnancy, second trimester: Secondary | ICD-10-CM

## 2023-02-22 LAB — POCT URINALYSIS DIPSTICK OB
Bilirubin, UA: NEGATIVE
Blood, UA: NEGATIVE
Glucose, UA: NEGATIVE
Nitrite, UA: NEGATIVE
Spec Grav, UA: 1.02 (ref 1.010–1.025)
Urobilinogen, UA: 1 E.U./dL
pH, UA: 7.5 (ref 5.0–8.0)

## 2023-02-22 MED ORDER — OMEPRAZOLE MAGNESIUM 20 MG PO TBEC: 20.0000 mg | DELAYED_RELEASE_TABLET | Freq: Every day | ORAL | 6 refills | Status: DC

## 2023-02-22 NOTE — Addendum Note (Signed)
Addended by: Quintella Baton D on: 02/22/2023 10:57 AM   Modules accepted: Orders

## 2023-02-22 NOTE — Progress Notes (Signed)
   PRENATAL VISIT NOTE  Subjective:  Brianna Marshall is a 32 y.o. G2P1001 at 67w4dbeing seen today for ongoing prenatal care.  She is currently monitored for the following issues for this low-risk pregnancy and has PCOS (polycystic ovarian syndrome); Prediabetes; Vitamin D deficiency; Insulin resistance; Major depression in partial remission ([place]; Anxiety; Migraine without aura and without status migrainosus, not intractable; Sleep disorder; Alopecia; Fatigue; Supervision of other normal pregnancy, antepartum; Obesity in pregnancy; and History of diet controlled gestational diabetes mellitus (GDM) on their problem list.  Patient reports heartburn.  Contractions: Not present. Vag. Bleeding: None.  Movement: Present. Denies leaking of fluid.   The following portions of the patient's history were reviewed and updated as appropriate: allergies, current medications, past family history, past medical history, past social history, past surgical history and problem list.   Objective:   Vitals:   02/22/23 1033  BP: 120/80  Weight: 217 lb (98.4 kg)  FH- 27 cm FHR- 160s  Fetal Status:     Movement: Present     General:  Alert, oriented and cooperative. Patient is in no acute distress.  Skin: Skin is warm and dry. No rash noted.   Cardiovascular: Normal heart rate noted  Respiratory: Normal respiratory effort, no problems with respiration noted  Abdomen: Soft, gravid, appropriate for gestational age.  Pain/Pressure: Present     Pelvic: Cervical exam deferred        Extremities: Normal range of motion.     Mental Status: Normal mood and affect. Normal behavior. Normal judgment and thought content.   Assessment and Plan:  Pregnancy: G2P1001 at 220w4d. Supervision of other normal pregnancy, antepartum - Mat 21 not done due to cost/not covered by her insurance  2. [redacted] weeks gestation of pregnancy   3. Obesity in pregnancy   4. History of diet controlled gestational diabetes mellitus  (GDM) - glucola at next visit  5. Heartburn not relieved with pepcid - I will prescribe prilosec. She thinks that she took this with her first pregnancy.  Preterm labor symptoms and general obstetric precautions including but not limited to vaginal bleeding, contractions, leaking of fluid and fetal movement were reviewed in detail with the patient. Please refer to After Visit Summary for other counseling recommendations.   Return in about 3 weeks (around 03/15/2023).  No future appointments.  MyEmily FilbertMD

## 2023-02-24 ENCOUNTER — Ambulatory Visit: Payer: BC Managed Care – PPO | Admitting: Internal Medicine

## 2023-02-24 ENCOUNTER — Encounter: Payer: Self-pay | Admitting: Internal Medicine

## 2023-02-24 VITALS — BP 144/76 | HR 113 | Temp 98.3°F | Ht 68.0 in | Wt 205.0 lb

## 2023-02-24 DIAGNOSIS — J020 Streptococcal pharyngitis: Secondary | ICD-10-CM | POA: Diagnosis not present

## 2023-02-24 LAB — POCT RAPID STREP A (OFFICE): Rapid Strep A Screen: POSITIVE — AB

## 2023-02-24 MED ORDER — AZITHROMYCIN 250 MG PO TABS: ORAL_TABLET | ORAL | 0 refills | Status: AC

## 2023-02-24 NOTE — Progress Notes (Signed)
Date:  02/24/2023   Name:  Brianna Marshall   DOB:  [birth year]   MRN:  [id number]   Chief Complaint: Sore Throat (24 hours, body aches, facial pressure, exposure 2 weeks ago. ) [redacted] weeks pregnant.  Sore Throat  This is a new problem. The current episode started yesterday. The problem has been gradually worsening. There has been no fever. The pain is moderate. Associated symptoms include headaches, neck pain, swollen glands and trouble swallowing. Pertinent negatives include no congestion, coughing or diarrhea. She has had exposure to strep. She has tried cool liquids and acetaminophen for the symptoms. The treatment provided mild relief.    Lab Results  Component Value Date   NA 141 12/17/2019   K 4.4 12/17/2019   CO2 20 12/17/2019   GLUCOSE 96 12/17/2019   BUN 8 12/17/2019   CREATININE 0.70 12/17/2019   CALCIUM 9.8 12/17/2019   GFRNONAA 119 12/17/2019   No results found for: "CHOL", "HDL", "LDLCALC", "LDLDIRECT", "TRIG", "CHOLHDL" Lab Results  Component Value Date   TSH 1.530 12/17/2019   Lab Results  Component Value Date   HGBA1C 5.4 12/17/2019   Lab Results  Component Value Date   WBC 7.4 11/18/2022   HGB 13.5 11/18/2022   HCT 38.9 11/18/2022   MCV 89 11/18/2022   PLT 233 11/18/2022   Lab Results  Component Value Date   ALT 17 12/17/2019   AST 16 12/17/2019   ALKPHOS 75 12/17/2019   BILITOT 0.2 12/17/2019   No results found for: "25OHVITD2", "25OHVITD3", "VD25OH"   Review of Systems  Constitutional:  Negative for chills, fatigue and fever.  HENT:  Positive for sore throat and trouble swallowing. Negative for congestion.   Respiratory:  Negative for cough and wheezing.   Gastrointestinal:  Negative for diarrhea.  Musculoskeletal:  Positive for neck pain.  Neurological:  Positive for headaches.  Psychiatric/Behavioral:  Negative for dysphoric mood and sleep disturbance. The patient is not nervous/anxious.     Patient Active Problem List   Diagnosis  Date Noted   Obesity in pregnancy 02/22/2023   History of diet controlled gestational diabetes mellitus (GDM) 02/22/2023   Supervision of other normal pregnancy, antepartum 11/16/2022   Migraine without aura and without status migrainosus, not intractable 12/17/2019   Sleep disorder 12/17/2019   Alopecia 12/17/2019   Fatigue 12/17/2019   Anxiety 08/13/2018   Insulin resistance 01/02/2018   Major depression in partial remission ([place]) 01/02/2018   PCOS (polycystic ovarian syndrome) 07/08/2016   Prediabetes 07/08/2016   Vitamin D deficiency 07/08/2016    Allergies  Allergen Reactions   Shellfish Allergy Nausea And Vomiting and Nausea Only   Amoxicillin Other (See Comments) and Rash   Lidocaine Rash    topical    Past Surgical History:  Procedure Laterality Date   ENDOSCOPIC CONCHA BULLOSA RESECTION Right 12/30/2021   Procedure: RIGHT CONCHA BULLOSA REDUCTION VIA NASAL ENDOSCOPY;  Surgeon: Margaretha Sheffield, MD;  Location: [place];  Service: ENT;  Laterality: Right;   INCISE AND DRAIN ABCESS Right 2011   Posterior Thigh   sebaceous cyst removal  07/2017   Back of neck   SEPTOPLASTY N/A 12/30/2021   Procedure: SEPTOPLASTY;  Surgeon: Margaretha Sheffield, MD;  Location: [place];  Service: ENT;  Laterality: N/A;   TURBINATE REDUCTION Right 12/30/2021   Procedure: PARTIAL EXCISION RIGHT INFERIOR TURBINATE;  Surgeon: Margaretha Sheffield, MD;  Location: [place];  Service: ENT;  Laterality: Right;   WISDOM TOOTH EXTRACTION  Social History   Tobacco Use   Smoking status: Never   Smokeless tobacco: Never  Vaping Use   Vaping Use: Never used  Substance Use Topics   Alcohol use: Not Currently    Alcohol/week: 0.0 standard drinks of alcohol    Comment: rarely   Drug use: No     Medication list has been reviewed and updated.  Current Meds  Medication Sig   Acetaminophen (TYLENOL PO) Take by mouth as needed.   albuterol (VENTOLIN HFA) 108 (90 Base) MCG/ACT  inhaler Inhale 2 puffs into the lungs every 6 (six) hours as needed for wheezing or shortness of breath.   aspirin EC 81 MG tablet Take 1 tablet (81 mg total) by mouth daily. Swallow whole.   azithromycin (ZITHROMAX Z-PAK) 250 MG tablet UAD   montelukast (SINGULAIR) 10 MG tablet Take 1 tablet (10 mg total) by mouth at bedtime.   omeprazole (PRILOSEC OTC) 20 MG tablet Take 1 tablet (20 mg total) by mouth daily.   ondansetron (ZOFRAN-ODT) 4 MG disintegrating tablet Take 1 tablet (4 mg total) by mouth every 6 (six) hours as needed for nausea.   sertraline (ZOLOFT) 100 MG tablet Take 1.5 tablets (150 mg total) by mouth daily.   [DISCONTINUED] ondansetron (ZOFRAN) 4 MG tablet TAKE ONE TABLET BY MOUTH EVERY EIGHT HOURS as needed for nausea or vomiting       02/24/2023    2:12 PM 11/08/2022    9:04 AM 06/14/2022   10:27 AM 02/17/2022   10:06 AM  GAD 7 : Generalized Anxiety Score  Nervous, Anxious, on Edge 0 '1 2 2  '$ Control/stop worrying 0 0 1 1  Worry too much - different things '1 2 3 2  '$ Trouble relaxing '1 2 1 2  '$ Restless 0 '1 2 1  '$ Easily annoyed or irritable '1 3 2 1  '$ Afraid - awful might happen 0 0 1 0  Total GAD 7 Score '3 9 12 9  '$ Anxiety Difficulty Not difficult at all Somewhat difficult Not difficult at all Somewhat difficult       02/24/2023    2:11 PM 11/08/2022    9:03 AM 06/14/2022   10:27 AM  Depression screen PHQ 2/9  Decreased Interest 0 1 1  Down, Depressed, Hopeless 0 1 1  PHQ - 2 Score 0 2 2  Altered sleeping '3 3 3  '$ Tired, decreased energy '3 3 3  '$ Change in appetite '3 3 3  '$ Feeling bad or failure about yourself  0 0 1  Trouble concentrating '2 2 2  '$ Moving slowly or fidgety/restless 0 2 2  Suicidal thoughts 0 0 0  PHQ-9 Score '11 15 16  '$ Difficult doing work/chores Somewhat difficult Somewhat difficult Somewhat difficult    BP Readings from Last 3 Encounters:  02/24/23 (!) 144/76  02/22/23 120/80  01/24/23 113/80    Physical Exam Vitals and nursing note reviewed.   Constitutional:      General: She is not in acute distress.    Appearance: She is well-developed.  HENT:     Head: Normocephalic and atraumatic.     Mouth/Throat:     Mouth: Mucous membranes are moist.     Pharynx: Posterior oropharyngeal erythema present.     Tonsils: No tonsillar exudate.  Cardiovascular:     Rate and Rhythm: Normal rate and regular rhythm.     Heart sounds: Normal heart sounds.  Pulmonary:     Effort: Pulmonary effort is normal. No respiratory distress.  Breath sounds: No wheezing or rhonchi.  Skin:    General: Skin is warm and dry.     Findings: No rash.  Neurological:     Mental Status: She is alert and oriented to person, place, and time.  Psychiatric:        Mood and Affect: Mood normal.        Behavior: Behavior normal.     Wt Readings from Last 3 Encounters:  02/24/23 205 lb (93 kg)  02/22/23 217 lb (98.4 kg)  01/24/23 219 lb 1.6 oz (99.4 kg)    BP (!) 144/76   Pulse (!) 113   Temp 98.3 F (36.8 C)   Ht '5\' 8"'$  (1.727 m)   Wt 205 lb (93 kg)   SpO2 98%   BMI 31.17 kg/m   Assessment and Plan: Problem List Items Addressed This Visit   None Visit Diagnoses     Pharyngitis due to Streptococcus species    -  Primary   Take Tylenol q 6 hours as needed cold or hot liquids for comfort follow up if needed   Relevant Medications   azithromycin (ZITHROMAX Z-PAK) 250 MG tablet   Other Relevant Orders   POCT rapid strep A (Completed)        Partially dictated using Editor, commissioning. Any errors are unintentional.  Halina Maidens, MD [facility]  02/24/2023

## 2023-02-27 ENCOUNTER — Other Ambulatory Visit: Payer: BC Managed Care – PPO

## 2023-02-27 ENCOUNTER — Other Ambulatory Visit: Payer: Self-pay | Admitting: Licensed Practical Nurse

## 2023-02-27 ENCOUNTER — Other Ambulatory Visit: Payer: Self-pay

## 2023-02-27 ENCOUNTER — Encounter: Payer: Self-pay | Admitting: Internal Medicine

## 2023-02-27 ENCOUNTER — Encounter: Payer: Self-pay | Admitting: Certified Nurse Midwife

## 2023-02-27 DIAGNOSIS — Z3480 Encounter for supervision of other normal pregnancy, unspecified trimester: Secondary | ICD-10-CM

## 2023-02-27 DIAGNOSIS — Z3A25 25 weeks gestation of pregnancy: Secondary | ICD-10-CM

## 2023-02-27 DIAGNOSIS — Z3A26 26 weeks gestation of pregnancy: Secondary | ICD-10-CM

## 2023-02-27 DIAGNOSIS — O0990 Supervision of high risk pregnancy, unspecified, unspecified trimester: Secondary | ICD-10-CM

## 2023-02-27 DIAGNOSIS — Z131 Encounter for screening for diabetes mellitus: Secondary | ICD-10-CM

## 2023-02-27 NOTE — Progress Notes (Signed)
Pt here for 28wk labs, orders placed Brianna Marshall, Brianna Marshall  02/27/23  11:21 AM

## 2023-02-28 LAB — 28 WEEK RH+PANEL
Basophils Absolute: 0 10*3/uL (ref 0.0–0.2)
Basos: 0 %
EOS (ABSOLUTE): 0 10*3/uL (ref 0.0–0.4)
Eos: 0 %
Gestational Diabetes Screen: 164 mg/dL — ABNORMAL HIGH (ref 70–139)
HIV Screen 4th Generation wRfx: NONREACTIVE
Hematocrit: 35.6 % (ref 34.0–46.6)
Hemoglobin: 12.2 g/dL (ref 11.1–15.9)
Immature Grans (Abs): 0 10*3/uL (ref 0.0–0.1)
Immature Granulocytes: 0 %
Lymphocytes Absolute: 1.5 10*3/uL (ref 0.7–3.1)
Lymphs: 14 %
MCH: 31.6 pg (ref 26.6–33.0)
MCHC: 34.3 g/dL (ref 31.5–35.7)
MCV: 92 fL (ref 79–97)
Monocytes Absolute: 0.4 10*3/uL (ref 0.1–0.9)
Monocytes: 4 %
Neutrophils Absolute: 8.8 10*3/uL — ABNORMAL HIGH (ref 1.4–7.0)
Neutrophils: 82 %
Platelets: 246 10*3/uL (ref 150–450)
RBC: 3.86 x10E6/uL (ref 3.77–5.28)
RDW: 12.4 % (ref 11.7–15.4)
RPR Ser Ql: NONREACTIVE
WBC: 10.7 10*3/uL (ref 3.4–10.8)

## 2023-03-01 ENCOUNTER — Other Ambulatory Visit: Payer: Self-pay | Admitting: Licensed Practical Nurse

## 2023-03-01 DIAGNOSIS — O0993 Supervision of high risk pregnancy, unspecified, third trimester: Secondary | ICD-10-CM

## 2023-03-01 NOTE — Progress Notes (Signed)
Pt's 1 hour glucose test elevated, orders placed for 3 hour test. Pt notified through mychart.  Roberto Scales, [facility]  03/01/23  10:34 AM

## 2023-03-07 ENCOUNTER — Other Ambulatory Visit: Payer: Self-pay | Admitting: Licensed Practical Nurse

## 2023-03-07 DIAGNOSIS — O24410 Gestational diabetes mellitus in pregnancy, diet controlled: Secondary | ICD-10-CM

## 2023-03-07 MED ORDER — FREESTYLE LIBRE 3 SENSOR MISC: 1.0000 | 8 refills | Status: DC

## 2023-03-07 NOTE — Progress Notes (Signed)
Pt had elevated 1 hour, does not want to do three hour (had already done it in early pregnancy).  Will treat pt as GDM. Pt agreeable to plan. Desires continuous glucose monitoring device. Order places for [place]. Pt does have her glucometer form her last pregnancy, she will begin to check her glucose 4 times a day until she receives the [place] [place] referral as she "grew up with a diabetic." Pt will keep a log of her glucose readings and bring to her next apt.  She will notify the office is there is any trouble getting the [facility]  03/07/23  4:14 PM

## 2023-03-14 ENCOUNTER — Ambulatory Visit (INDEPENDENT_AMBULATORY_CARE_PROVIDER_SITE_OTHER): Payer: BC Managed Care – PPO | Admitting: Obstetrics

## 2023-03-14 VITALS — BP 129/82 | HR 102 | Wt 221.0 lb

## 2023-03-14 DIAGNOSIS — Z23 Encounter for immunization: Secondary | ICD-10-CM | POA: Diagnosis not present

## 2023-03-14 DIAGNOSIS — Z3A28 28 weeks gestation of pregnancy: Secondary | ICD-10-CM

## 2023-03-14 DIAGNOSIS — O0993 Supervision of high risk pregnancy, unspecified, third trimester: Secondary | ICD-10-CM

## 2023-03-14 DIAGNOSIS — O219 Vomiting of pregnancy, unspecified: Secondary | ICD-10-CM

## 2023-03-14 DIAGNOSIS — Z3480 Encounter for supervision of other normal pregnancy, unspecified trimester: Secondary | ICD-10-CM

## 2023-03-14 MED ORDER — ONDANSETRON 4 MG PO TBDP: 4.0000 mg | ORAL_TABLET | Freq: Four times a day (QID) | ORAL | 2 refills | Status: DC | PRN

## 2023-03-14 NOTE — Progress Notes (Signed)
Routine Prenatal Care Visit  Subjective  Brianna Marshall is a 32 y.o. G2P1001 at 100w3d being seen today for ongoing prenatal care.  She is currently monitored for the following issues for this high-risk pregnancy and has PCOS (polycystic ovarian syndrome); Prediabetes; Vitamin D deficiency; Insulin resistance; Major depression in partial remission ([place]); Anxiety; Migraine without aura and without status migrainosus, not intractable; Sleep disorder; Alopecia; Fatigue; Supervision of other normal pregnancy, antepartum; Obesity in pregnancy; and History of diet controlled gestational diabetes mellitus (GDM) on their problem list.  ----------------------------------------------------------------------------------- Patient reports no complaints.  She has named her baby Roni Bread. She is checking her glucose levels throughout the day, but has used a 70-150 range, rather than the 120 for the high limit.Her fasting levels per her phone are in range, but she has elevated readings- it is not clear from her phone monitor , when these occur related to meals. She is also struggling with sinus congestion  and nasal drip Contractions: Not present. Vag. Bleeding: None.  Movement: Present. Leaking Fluid denies.  ----------------------------------------------------------------------------------- The following portions of the patient's history were reviewed and updated as appropriate: allergies, current medications, past family history, past medical history, past social history, past surgical history and problem list. Problem list updated.  Objective  Blood pressure 129/82, pulse (!) 102, weight 221 lb (100.2 kg). Pregravid weight 230 lb (104.3 kg) Total Weight Gain -9 lb (-4.082 kg) Urinalysis: Urine Protein    Urine Glucose    Fetal Status:     Movement: Present     General:  Alert, oriented and cooperative. Patient is in no acute distress.  Skin: Skin is warm and dry. No rash noted.   Cardiovascular: Normal heart  rate noted  Respiratory: Normal respiratory effort, no problems with respiration noted  Abdomen: Soft, gravid, appropriate for gestational age. Pain/Pressure: Present     Pelvic:  Cervical exam deferred        Extremities: Normal range of motion.  Edema: None  Mental Status: Normal mood and affect. Normal behavior. Normal judgment and thought content.   Assessment   32 y.o. G2P1001 at [redacted]w[redacted]d by  06/03/2023, by Ultrasound presenting for  routine  prenatal visit  Gestational diabetes- relatively well controlled  Plan   SECOND Problems (from 11/16/22 to present)     No problems associated with this episode.        Preterm labor symptoms and general obstetric precautions including but not limited to vaginal bleeding, contractions, leaking of fluid and fetal movement were reviewed in detail with the patient.  She will stat on claritin to address her allergies. I have asked her to review her glucose levels with the Mds at her next visit.   Please refer to After Visit Summary for other counseling recommendations.   Return in about 2 weeks (around 03/28/2023) for return OB with MD if possible  to check glucose readings.Imagene Riches, CNM  03/14/2023 5:06 PM

## 2023-03-15 ENCOUNTER — Encounter: Payer: Self-pay | Admitting: Obstetrics

## 2023-03-20 ENCOUNTER — Telehealth: Payer: Self-pay | Admitting: Obstetrics and Gynecology

## 2023-03-20 NOTE — Telephone Encounter (Signed)
Reached out to pt to schedule ROB with MD (check glucose readings).  Scheduled appt for 03/29/23 at 10:55 with DJE.  Left message for pt to call back to confirm the appt.

## 2023-03-23 NOTE — Telephone Encounter (Signed)
The patient is aware via my chart as of 03/20/2023  4:18 PM

## 2023-03-29 ENCOUNTER — Encounter: Payer: Self-pay | Admitting: Obstetrics and Gynecology

## 2023-03-29 ENCOUNTER — Ambulatory Visit (INDEPENDENT_AMBULATORY_CARE_PROVIDER_SITE_OTHER): Payer: BC Managed Care – PPO | Admitting: Obstetrics and Gynecology

## 2023-03-29 VITALS — BP 116/89 | HR 121 | Wt 214.1 lb

## 2023-03-29 DIAGNOSIS — Z3A30 30 weeks gestation of pregnancy: Secondary | ICD-10-CM

## 2023-03-29 DIAGNOSIS — O24410 Gestational diabetes mellitus in pregnancy, diet controlled: Secondary | ICD-10-CM

## 2023-03-29 DIAGNOSIS — O0993 Supervision of high risk pregnancy, unspecified, third trimester: Secondary | ICD-10-CM

## 2023-03-29 LAB — POCT URINALYSIS DIPSTICK OB
Bilirubin, UA: NEGATIVE
Blood, UA: NEGATIVE
Glucose, UA: NEGATIVE
Leukocytes, UA: NEGATIVE
Nitrite, UA: NEGATIVE
Spec Grav, UA: 1.015 (ref 1.010–1.025)
Urobilinogen, UA: 0.2 E.U./dL
pH, UA: 6 (ref 5.0–8.0)

## 2023-03-29 NOTE — Progress Notes (Signed)
ROB. Patient states daily fetal movement with occasional pelvic pressure. States she has been monitoring her sugars however she forgot her log today. Continuing to have trouble with nausea and vomiting, taking Zofran and Prilosec. She states questions regarding induction.

## 2023-03-29 NOTE — Progress Notes (Signed)
ROB: Did not bring sugar log.  Strongly encouraged to bring it to next visit.  History of 9 pound 10 ounce baby.  Patient says she is nauseated every day and continues to take scheduled Zofran and use B6 and Unisom.  Taking 150 mg daily of Zoloft.  Asking questions about scheduling early induction-at this time it is not indicated unless her sugars become an issue or she becomes gestational diabetes on medication.  Taking aspirin and vitamins as directed.

## 2023-04-03 ENCOUNTER — Telehealth: Payer: Self-pay

## 2023-04-03 ENCOUNTER — Encounter: Payer: Self-pay | Admitting: Obstetrics and Gynecology

## 2023-04-03 ENCOUNTER — Other Ambulatory Visit: Payer: Self-pay

## 2023-04-03 ENCOUNTER — Observation Stay
Admission: EM | Admit: 2023-04-03 | Discharge: 2023-04-04 | Disposition: A | Discharge status: Home / Self Care | Payer: BC Managed Care – PPO | Attending: Obstetrics and Gynecology | Admitting: Obstetrics and Gynecology

## 2023-04-03 DIAGNOSIS — Z7982 Long term (current) use of aspirin: Secondary | ICD-10-CM | POA: Insufficient documentation

## 2023-04-03 DIAGNOSIS — O219 Vomiting of pregnancy, unspecified: Principal | ICD-10-CM | POA: Diagnosis present

## 2023-04-03 DIAGNOSIS — O99513 Diseases of the respiratory system complicating pregnancy, third trimester: Secondary | ICD-10-CM | POA: Diagnosis not present

## 2023-04-03 DIAGNOSIS — Z8616 Personal history of COVID-19: Secondary | ICD-10-CM | POA: Insufficient documentation

## 2023-04-03 DIAGNOSIS — Z91040 Latex allergy status: Secondary | ICD-10-CM | POA: Insufficient documentation

## 2023-04-03 DIAGNOSIS — J45909 Unspecified asthma, uncomplicated: Secondary | ICD-10-CM | POA: Diagnosis not present

## 2023-04-03 DIAGNOSIS — Z79899 Other long term (current) drug therapy: Secondary | ICD-10-CM | POA: Insufficient documentation

## 2023-04-03 DIAGNOSIS — Z7984 Long term (current) use of oral hypoglycemic drugs: Secondary | ICD-10-CM | POA: Diagnosis not present

## 2023-04-03 DIAGNOSIS — Z3A31 31 weeks gestation of pregnancy: Secondary | ICD-10-CM | POA: Diagnosis not present

## 2023-04-03 LAB — COMPREHENSIVE METABOLIC PANEL
ALT: 40 U/L (ref 0–44)
AST: 37 U/L (ref 15–41)
Albumin: 2.7 g/dL — ABNORMAL LOW (ref 3.5–5.0)
Alkaline Phosphatase: 97 U/L (ref 38–126)
Anion gap: 11 (ref 5–15)
BUN: 10 mg/dL (ref 6–20)
CO2: 19 mmol/L — ABNORMAL LOW (ref 22–32)
Calcium: 9.4 mg/dL (ref 8.9–10.3)
Chloride: 103 mmol/L (ref 98–111)
Creatinine, Ser: 0.6 mg/dL (ref 0.44–1.00)
GFR, Estimated: >60 mL/min (ref >60)
Glucose, Bld: 85 mg/dL (ref 70–99)
Potassium: 3.6 mmol/L (ref 3.5–5.1)
Sodium: 133 mmol/L — ABNORMAL LOW (ref 135–145)
Total Bilirubin: 0.9 mg/dL (ref 0.3–1.2)
Total Protein: 6.6 g/dL (ref 6.5–8.1)

## 2023-04-03 LAB — URINALYSIS, ROUTINE W REFLEX MICROSCOPIC
Bacteria, UA: NONE SEEN
Glucose, UA: NEGATIVE mg/dL
Hgb urine dipstick: NEGATIVE
Ketones, ur: 80 mg/dL — AB
Nitrite: NEGATIVE
Protein, ur: 100 mg/dL — AB
Specific Gravity, Urine: 1.025 (ref 1.005–1.030)
pH: 5 (ref 5.0–8.0)

## 2023-04-03 LAB — CBC
HCT: 35.8 % — ABNORMAL LOW (ref 36.0–46.0)
Hemoglobin: 12 g/dL (ref 12.0–15.0)
MCH: 28 pg (ref 26.0–34.0)
MCHC: 33.5 g/dL (ref 30.0–36.0)
MCV: 83.6 fL (ref 80.0–100.0)
Platelets: 263 10*3/uL (ref 150–400)
RBC: 4.28 MIL/uL (ref 3.87–5.11)
RDW: 12.7 % (ref 11.5–15.5)
WBC: 10.8 10*3/uL — ABNORMAL HIGH (ref 4.0–10.5)
nRBC: 0 % (ref 0.0–0.2)

## 2023-04-03 MED ORDER — SODIUM CHLORIDE 0.9 % IV SOLN
12.5000 mg | Freq: Once | INTRAVENOUS | Status: AC
  Administered 2023-04-03: 12.5 mg via INTRAVENOUS
  Filled 2023-04-03: qty 12.5

## 2023-04-03 MED ORDER — ALUM & MAG HYDROXIDE-SIMETH 200-200-20 MG/5ML PO SUSP
15.0000 mL | Freq: Once | ORAL | Status: AC
  Administered 2023-04-03: 15 mL via ORAL
  Filled 2023-04-03: qty 30

## 2023-04-03 MED ORDER — ONDANSETRON HCL 4 MG/2ML IJ SOLN
INTRAMUSCULAR | Status: AC
  Administered 2023-04-03: 4 mg
  Filled 2023-04-03: qty 2

## 2023-04-03 MED ORDER — LACTATED RINGERS IV SOLN: INTRAVENOUS | Status: DC

## 2023-04-03 NOTE — Telephone Encounter (Signed)
Patient called. She has concerns about her heartburn. She has been taking Prilosec to manage the symptoms. She reports that it got worse yesterday. She has not been able to eat. She has been throwing up bile with blood in it. Please advise.

## 2023-04-03 NOTE — H&P (Signed)
Brianna Marshall is a 32 y.o. female presenting from home complaining of nausea and vomiting numerous times this afternoon and evening. She ahs been struggling with this throughout the pregnancy, but this evening she has thrown up to the point of vomiting bile and has seen some blood in her emesis. She has noit eaten much today, and her pregnancy is notable for GDM. This is an IUI pregnancy. OB History     Gravida  2   Para  1   Term  1   Preterm      AB      Living  1      SAB      IAB      Ectopic      Multiple  0   Live Births  1          Past Medical History:  Diagnosis Date   Anemia in pregnancy 05/30/2018   Antepartum mild preeclampsia, third trimester 07/30/2018   Asthma    COVID-19 02/2020   Depression    Family history of pancreatic cancer    Gestational diabetes 06/11/2018   Gestational diabetes mellitus (GDM) affecting third pregnancy 07/29/2018   Headache, migraine 07/14/2015   approx 2x/month   Hyperemesis 06/05/2018   Infertility, anovulation 03/10/2017   Nausea/vomiting in pregnancy 01/17/2018   Obesity    PCOS (polycystic ovarian syndrome) 07/08/2016   Prediabetes 07/08/2016   Hgb 5.8  04/2015    Supervision of high risk pregnancy, antepartum 01/02/2018   Clinic [facility] Prenatal Labs Dating Early [place] Blood type: O/Positive/-- (01/29 1038)  Genetic Screen 1 Screen: Neg Antibody:Negative (01/29 1038) Anatomic [place] Normal, anterior placenta,  Rubella: <0.90 (01/29 1038) Varicella: Imm GTT Early: 123 Third trimester:  RPR: Non Reactive (01/29 1038)  Rhogam n/a HBsAg: Negative (01/29 1038)  TDaP vaccine        05/30/2018    Flu Shot: 09/2017 HIV: Non Reac   Vertigo    for short time after covid   Vitamin D deficiency 07/08/2016   18.7 in 04/2015    Past Surgical History:  Procedure Laterality Date   ENDOSCOPIC CONCHA BULLOSA RESECTION Right 12/30/2021   Procedure: RIGHT CONCHA BULLOSA REDUCTION VIA NASAL ENDOSCOPY;  Surgeon: Vernie Murders, MD;   Location: [facility];  Service: ENT;  Laterality: Right;   INCISE AND DRAIN ABCESS Right 2011   Posterior Thigh   sebaceous cyst removal  07/2017   Back of neck   SEPTOPLASTY N/A 12/30/2021   Procedure: SEPTOPLASTY;  Surgeon: Vernie Murders, MD;  Location: [facility];  Service: ENT;  Laterality: N/A;   TURBINATE REDUCTION Right 12/30/2021   Procedure: PARTIAL EXCISION RIGHT INFERIOR TURBINATE;  Surgeon: Vernie Murders, MD;  Location: [facility];  Service: ENT;  Laterality: Right;   WISDOM TOOTH EXTRACTION     Family History: family history includes ADD / ADHD in her brother; Alcohol abuse in her father and maternal aunt; Bipolar disorder in her maternal aunt; Breast cancer (age of onset: 6) in her maternal aunt; COPD in her paternal grandmother; Depression in her paternal grandmother; Diabetes in her maternal aunt, maternal grandmother, and maternal uncle; Drug abuse in her father; Healthy in her sister; OCD in her brother; Osteoporosis in her mother; Pancreatic cancer (age of onset: 71) in her maternal grandmother; Prostate cancer in her maternal uncle. Social History:  reports that she has never smoked. She has never used smokeless tobacco. She reports that she does not currently use alcohol. She reports that  she does not use drugs.     Maternal Diabetes: Yes:  Diabetes Type:  Insulin/Medication controlled Genetic Screening: Normal Maternal Ultrasounds/Referrals: Normal Fetal Ultrasounds or other Referrals:  Referred to Materal Fetal Medicine  Maternal Substance Abuse:  No Significant Maternal Medications:  Meds include: Other: Prilosec Significant Maternal Lab Results:  None Number of Prenatal Visits:greater than 3 verified prenatal visits Other Comments:  None  Review of Systems  Constitutional:  Positive for appetite change.  HENT: Negative.    Eyes: Negative.   Respiratory: Negative.    Cardiovascular: Negative.   Gastrointestinal:  Positive for nausea  and vomiting.  Endocrine: Negative.   Genitourinary:  Positive for decreased urine volume.  Allergic/Immunologic: Negative.   Neurological: Negative.   Hematological: Negative.   Psychiatric/Behavioral: Negative.     Maternal Medical History:  Reason for admission: Nausea.       Blood pressure 121/81, pulse (!) 101, temperature 98.1 F (36.7 C), temperature source Axillary, height  (1.727 m), weight 97.1 kg. Maternal Exam:  Uterine Assessment: Contraction strength is mild.  Contraction frequency is regular.  Abdomen: Fetal presentation: no presenting part Introitus: not evaluated.     Physical Exam Constitutional:      Appearance: Normal appearance. She is obese.  HENT:     Head: Normocephalic and atraumatic.  Cardiovascular:     Rate and Rhythm: Normal rate and regular rhythm.     Pulses: Normal pulses.     Heart sounds: Normal heart sounds.  Pulmonary:     Effort: Pulmonary effort is normal.     Breath sounds: Normal breath sounds.  Abdominal:     General: Bowel sounds are normal.     Palpations: Abdomen is soft.  Genitourinary:    Comments: deferred Musculoskeletal:        General: Normal range of motion.     Cervical back: Normal range of motion and neck supple.  Skin:    General: Skin is warm and dry.  Neurological:     General: No focal deficit present.     Mental Status: She is oriented to person, place, and time.  Psychiatric:        Mood and Affect: Mood normal.        Behavior: Behavior normal.     Prenatal labs: ABO, Rh: O/Positive/-- (12/01 1022) Antibody: Negative (12/01 1022) Rubella: 3.40 (12/01 1022) RPR: Non Reactive (03/11 1134)  HBsAg: Negative (12/01 1022)  HIV: Non Reactive (03/11 1134)  GBS:     Assessment/Plan: 32 yo Gravida 2 Para 1, now 31 weeks 2 days gestation IUI pregnancy Nausea and vomiting  Preterm contractions- not noticed by the patient, ie, mild.  Plan: admit to observation IV of Lactated Ringers- 500 cc bolus  to start. Send urine for UA EFM continuous CBC, CMP IV Zofran Consider IV Phenergan if Zofram prooves insufficient. Advance diet slowly. Clear liquids as tolerated Will discharge home once tolerating po  and no longer vomiting.  Mirna Mires, CNM  04/03/2023 11:41 PM    Mirna Mires 04/03/2023, 11:29 PM

## 2023-04-03 NOTE — OB Triage Note (Signed)
Brianna Marshall 31 y.o. G2P1001GA  presents to Labor & Delivery triage via wheelchair steered by ED staff reporting nausea and vomiting. This began last night, feeling like acid reflux, then she began vomiting bright red blood at around 0715. She called the nurse line to ask if she could come in and did not receive a call back, so now presents to triage. She has had 1 episode of vomiting bright red emesis since arrival. She reports this is the 3rd episode of vomiting today. She has had sips of water today and ate a banana around 1800 and vomited shortly after. She also had a popsicle that she was unable to tolerate. She reports constant N/V throughout both of her pregnancies. She denies signs and symptoms consistent with rupture of membranes or active vaginal bleeding. She denies contractions and states positive fetal movement. External FM and TOCO applied to non-tender abdomen. Initial FHR 155. Vital signs obtained- temp 99.2 and BP 133/94, cycling q15. Patient oriented to care environment including call bell and bed control use. Paula Compton, CNM notified of patient's arrival. Plan to start IV, give 500 cc bolus, draw CBC& CMP, and give IV Zofran. Ice chips & crackers provided. Supportive husband at bedside. Patient agreeable to plan.

## 2023-04-04 DIAGNOSIS — O212 Late vomiting of pregnancy: Secondary | ICD-10-CM | POA: Diagnosis not present

## 2023-04-04 DIAGNOSIS — Z3A31 31 weeks gestation of pregnancy: Secondary | ICD-10-CM | POA: Diagnosis not present

## 2023-04-04 DIAGNOSIS — O219 Vomiting of pregnancy, unspecified: Secondary | ICD-10-CM | POA: Diagnosis not present

## 2023-04-04 NOTE — Final Progress Note (Signed)
Final Progress Note  Patient ID: Brianna Marshall MRN: [id number] DOB/AGE: [birth year] 32 y.o.  Admit date: 04/03/2023 Admitting provider: Mirna Mires, CNM Discharge date: 04/04/2023   Admission Diagnoses: Nausea and vomiting in pregnancy  Discharge Diagnoses:  Principal Problem:   Nausea and vomiting in pregnancy  IUP [redacted] weeks gestation  History of Present Illness: The patient is a 32 y.o. female G2P1001 at [redacted]w[redacted]d who presents for treatment for extensive vomiting this afternoon and evening. She has not been able to keep anything down and had thrown up bile. She has struggled with N/V this pregnancy, and her ODT Zofran was snot helping when she took some today..  Her prenancy is noted for the following problems:  Past Medical History:  Diagnosis Date   Anemia in pregnancy 05/30/2018   Antepartum mild preeclampsia, third trimester 07/30/2018   Asthma    COVID-19 02/2020   Depression    Family history of pancreatic cancer    Gestational diabetes 06/11/2018   Gestational diabetes mellitus (GDM) affecting third pregnancy 07/29/2018   Headache, migraine 07/14/2015   approx 2x/month   Hyperemesis 06/05/2018   Infertility, anovulation 03/10/2017   Nausea/vomiting in pregnancy 01/17/2018   Obesity    PCOS (polycystic ovarian syndrome) 07/08/2016   Prediabetes 07/08/2016   Hgb 5.8  04/2015    Supervision of high risk pregnancy, antepartum 01/02/2018   Clinic [facility] Prenatal Labs Dating Early [place] Blood type: O/Positive/-- (01/29 1038)  Genetic Screen 1 Screen: Neg Antibody:Negative (01/29 1038) Anatomic [place] Normal, anterior placenta,  Rubella: <0.90 (01/29 1038) Varicella: Imm GTT Early: 123 Third trimester:  RPR: Non Reactive (01/29 1038)  Rhogam n/a HBsAg: Negative (01/29 1038)  TDaP vaccine        05/30/2018    Flu Shot: 09/2017 HIV: Non Reac   Vertigo    for short time after covid   Vitamin D deficiency 07/08/2016   18.7 in 04/2015     Past Surgical History:  Procedure  Laterality Date   ENDOSCOPIC CONCHA BULLOSA RESECTION Right 12/30/2021   Procedure: RIGHT CONCHA BULLOSA REDUCTION VIA NASAL ENDOSCOPY;  Surgeon: Vernie Murders, MD;  Location: [facility];  Service: ENT;  Laterality: Right;   INCISE AND DRAIN ABCESS Right 2011   Posterior Thigh   sebaceous cyst removal  07/2017   Back of neck   SEPTOPLASTY N/A 12/30/2021   Procedure: SEPTOPLASTY;  Surgeon: Vernie Murders, MD;  Location: [facility];  Service: ENT;  Laterality: N/A;   TURBINATE REDUCTION Right 12/30/2021   Procedure: PARTIAL EXCISION RIGHT INFERIOR TURBINATE;  Surgeon: Vernie Murders, MD;  Location: [facility];  Service: ENT;  Laterality: Right;   WISDOM TOOTH EXTRACTION      No current facility-administered medications on file prior to encounter.   Current Outpatient Medications on File Prior to Encounter  Medication Sig Dispense Refill   Acetaminophen (TYLENOL PO) Take by mouth as needed.     aspirin EC 81 MG tablet Take 1 tablet (81 mg total) by mouth daily. Swallow whole. 30 tablet 12   Continuous Blood Gluc Sensor (FREESTYLE LIBRE 3 SENSOR) MISC 1 patch by Does not apply route every 14 (fourteen) days. Place 1 sensor on the skin every 14 days. Use to check glucose continuously 2 each 8   doxylamine, Sleep, (UNISOM) 25 MG tablet Take 25 mg by mouth at bedtime as needed (nauea).     folic acid (FOLVITE) 400 MCG tablet Take 400 mcg by mouth daily.     montelukast (SINGULAIR)  10 MG tablet Take 1 tablet (10 mg total) by mouth at bedtime. 90 tablet 1   omeprazole (PRILOSEC OTC) 20 MG tablet Take 1 tablet (20 mg total) by mouth daily. 30 tablet 6   ondansetron (ZOFRAN-ODT) 4 MG disintegrating tablet Take 1 tablet (4 mg total) by mouth every 6 (six) hours as needed for nausea. 20 tablet 2   pyridOXINE (VITAMIN B6) 50 MG tablet Take 50 mg by mouth daily.     sertraline (ZOLOFT) 100 MG tablet Take 1.5 tablets (150 mg total) by mouth daily. 45 tablet 2   albuterol (VENTOLIN  HFA) 108 (90 Base) MCG/ACT inhaler Inhale 2 puffs into the lungs every 6 (six) hours as needed for wheezing or shortness of breath. 6.7 g 11   metFORMIN (GLUCOPHAGE) 1000 MG tablet Take 1,000 mg by mouth 2 (two) times daily. (Patient not taking: Reported on 02/24/2023)      Allergies  Allergen Reactions   Shellfish Allergy Nausea And Vomiting and Nausea Only   Latex Dermatitis   Amoxicillin Other (See Comments) and Rash   Lidocaine Rash    topical    Social History   Socioeconomic History   Marital status: Married    Spouse name: Denyse Amass   Number of children: 1   Years of education: 12   Highest education level: High school graduate  Occupational History   Occupation: Printmaker  Tobacco Use   Smoking status: Never   Smokeless tobacco: Never  Vaping Use   Vaping Use: Never used  Substance and Sexual Activity   Alcohol use: Not Currently    Alcohol/week: 0.0 standard drinks of alcohol    Comment: rarely   Drug use: No   Sexual activity: Yes    Partners: Male    Birth control/protection: None  Other Topics Concern   Not on file  Social History Narrative   Not on file   Social Determinants of Health   Financial Resource Strain: Medium Risk (11/16/2022)   Overall Financial Resource Strain (CARDIA)    Difficulty of Paying Living Expenses: Somewhat hard  Food Insecurity: Food Insecurity Present (11/16/2022)   Hunger Vital Sign    Worried About Running Out of Food in the Last Year: Sometimes true    Ran Out of Food in the Last Year: Sometimes true  Transportation Needs: No Transportation Needs (06/14/2022)   PRAPARE - Administrator, Civil Service (Medical): No    Lack of Transportation (Non-Medical): No  Physical Activity: Insufficiently Active (11/16/2022)   Exercise Vital Sign    Days of Exercise per Week: 3 days    Minutes of Exercise per Session: 20 min  Stress: No Stress Concern Present (11/16/2022)   [employer] of Occupational Health -  Occupational Stress Questionnaire    Feeling of Stress : Only a little  Social Connections: Moderately Isolated (11/16/2022)   Social Connection and Isolation Panel [NHANES]    Frequency of Communication with Friends and Family: More than three times a week    Frequency of Social Gatherings with Friends and Family: Never    Attends Religious Services: Never    Database administrator or Organizations: No    Attends Banker Meetings: Never    Marital Status: Married  Catering manager Violence: Not At Risk (11/16/2022)   Humiliation, Afraid, Rape, and Kick questionnaire    Fear of Current or Ex-Partner: No    Emotionally Abused: No    Physically Abused: No    Sexually Abused:  No    Family History  Problem Relation Age of Onset   Osteoporosis Mother    Alcohol abuse Father    Drug abuse Father    Healthy Sister    ADD / ADHD Brother    OCD Brother    Diabetes Maternal Grandmother    Pancreatic cancer Maternal Grandmother 26   Depression Paternal Grandmother    COPD Paternal Grandmother    Alcohol abuse Maternal Aunt    Bipolar disorder Maternal Aunt    Diabetes Maternal Aunt    Breast cancer Maternal Aunt 42       BRCA neg   Diabetes Maternal Uncle    Prostate cancer Maternal Uncle      Review of Systems  Constitutional: Negative.   HENT: Negative.    Eyes: Negative.   Respiratory: Negative.    Cardiovascular: Negative.   Gastrointestinal:  Positive for nausea and vomiting.  Genitourinary: Negative.   Musculoskeletal: Negative.   Skin: Negative.   Neurological: Negative.   Endo/Heme/Allergies: Negative.   Psychiatric/Behavioral: Negative.       Physical Exam: BP 121/81   Pulse (!) 101   Temp 98.1 F (36.7 C) (Axillary)   Ht 5\' 8"  (1.727 m)   Wt 97.1 kg   BMI 32.54 kg/m   Physical Exam Constitutional:      Appearance: Normal appearance.  Genitourinary:     Genitourinary Comments: Exam deferred  HENT:     Head: Normocephalic and atraumatic.   Cardiovascular:     Rate and Rhythm: Normal rate and regular rhythm.     Pulses: Normal pulses.     Heart sounds: Normal heart sounds.  Pulmonary:     Effort: Pulmonary effort is normal.     Breath sounds: Normal breath sounds.  Abdominal:     Palpations: Abdomen is soft.     Comments: Gravid uterus  Musculoskeletal:        General: Normal range of motion.     Cervical back: Normal range of motion and neck supple.  Neurological:     General: No focal deficit present.     Mental Status: She is oriented to person, place, and time.  Skin:    General: Skin is warm and dry.  Psychiatric:        Mood and Affect: Mood normal.        Behavior: Behavior normal.   EFM: FHTS: 135 baseline, moderate variability, accels pesent, decels absent UCCs have almost ceased. The patient has not been aware of her Ucs.  Consults: None  Significant Findings/ Diagnostic Studies: labs:  Results for orders placed or performed during the hospital encounter of 04/03/23 (from the past 24 hour(s))  Comprehensive metabolic panel     Status: Abnormal   Collection Time: 04/03/23  9:10 PM  Result Value Ref Range   Sodium 133 (L) 135 - 145 mmol/L   Potassium 3.6 3.5 - 5.1 mmol/L   Chloride 103 98 - 111 mmol/L   CO2 19 (L) 22 - 32 mmol/L   Glucose, Bld 85 70 - 99 mg/dL   BUN 10 6 - 20 mg/dL   Creatinine, Ser 1.61 0.44 - 1.00 mg/dL   Calcium 9.4 8.9 - 09.6 mg/dL   Total Protein 6.6 6.5 - 8.1 g/dL   Albumin 2.7 (L) 3.5 - 5.0 g/dL   AST 37 15 - 41 U/L   ALT 40 0 - 44 U/L   Alkaline Phosphatase 97 38 - 126 U/L   Total Bilirubin 0.9 0.3 -  1.2 mg/dL   GFR, Estimated >19 >14 mL/min   Anion gap 11 5 - 15  Urinalysis, Routine w reflex microscopic -Urine, Clean Catch     Status: Abnormal   Collection Time: 04/03/23  9:10 PM  Result Value Ref Range   Color, Urine AMBER (A) YELLOW   APPearance CLOUDY (A) CLEAR   Specific Gravity, Urine 1.025 1.005 - 1.030   pH 5.0 5.0 - 8.0   Glucose, UA NEGATIVE NEGATIVE  mg/dL   Hgb urine dipstick NEGATIVE NEGATIVE   Bilirubin Urine SMALL (A) NEGATIVE   Ketones, ur 80 (A) NEGATIVE mg/dL   Protein, ur 782 (A) NEGATIVE mg/dL   Nitrite NEGATIVE NEGATIVE   Leukocytes,Ua SMALL (A) NEGATIVE   RBC / HPF 0-5 0 - 5 RBC/hpf   WBC, UA 11-20 0 - 5 WBC/hpf   Bacteria, UA NONE SEEN NONE SEEN   Squamous Epithelial / HPF 6-10 0 - 5 /HPF   Mucus PRESENT    Hyaline Casts, UA PRESENT    Granular Casts, UA PRESENT   CBC     Status: Abnormal   Collection Time: 04/03/23  9:10 PM  Result Value Ref Range   WBC 10.8 (H) 4.0 - 10.5 K/uL   RBC 4.28 3.87 - 5.11 MIL/uL   Hemoglobin 12.0 12.0 - 15.0 g/dL   HCT 95.6 (L) 21.3 - 08.6 %   MCV 83.6 80.0 - 100.0 fL   MCH 28.0 26.0 - 34.0 pg   MCHC 33.5 30.0 - 36.0 g/dL   RDW 57.8 46.9 - 62.9 %   Platelets 263 150 - 400 K/uL   nRBC 0.0 0.0 - 0.2 %     Procedures: EFM NST Baseline FHR: 135 beats/min Variability: moderate Accelerations: present Decelerations: absent Tocometry: See ntoe above- mild regular contractions noted with early monitoring- after treatment with IV hydration. These have largely been eliminated.  Interpretation:  INDICATIONS: rule out uterine contractions and address her dehyration RESULTS:  A NST procedure was performed with FHR monitoring and a normal baseline established, appropriate time of 20-40 minutes of evaluation, and accels >2 seen w 15x15 characteristics.  Results show a REACTIVE NST.    Hospital Course: The patient was admitted to Labor and Delivery Triage for observation. She was placed on the fetal monitor and a reactive Nst was noted. Her contractions, although unnoticed by her, were regular, and an IV bolus was started with LR to rehydrate her. Labwork m, including a CBC, CMP, and a UA were retrieved to check her chemistries. Her UA indicated ketosis, and the patient received ample IV fluids.Both IV zofran and later IV Phenergan were provided to control her vomiting. She received some Maalox  to address heartburn as well. After several hours of hydration and antiemetics, her earlier contraction pattern was essentially eliminated, and the patient was then discharged home to continue hydrating. She will follow up at her next OB appointment, and has Zofran ODT at home to use PRN.  Discharge Condition: good  Disposition: Discharge disposition: 01-Home or Self Care       Diet: Diabetic diet  Discharge Activity: Activity as tolerated  Discharge Instructions     Notify physician for a general feeling that "something is not right"   Complete by: As directed    Notify physician for increase or change in vaginal discharge   Complete by: As directed    Notify physician for intestinal cramps, with or without diarrhea, sometimes described as "gas pain"   Complete by: As directed  Notify physician for leaking of fluid   Complete by: As directed    Notify physician for low, dull backache, unrelieved by heat or Tylenol   Complete by: As directed    Notify physician for menstrual like cramps   Complete by: As directed    Notify physician for pelvic pressure   Complete by: As directed    Notify physician for uterine contractions.  These may be painless and feel like the uterus is tightening or the baby is  "balling up"   Complete by: As directed    Notify physician for vaginal bleeding   Complete by: As directed    PRETERM LABOR:  Includes any of the follwing symptoms that occur between 20 - [redacted] weeks gestation.  If these symptoms are not stopped, preterm labor can result in preterm delivery, placing your baby at risk   Complete by: As directed       Allergies as of 04/04/2023       Reactions   Shellfish Allergy Nausea And Vomiting, Nausea Only   Latex Dermatitis   Amoxicillin Other (See Comments), Rash   Lidocaine Rash   topical        Medication List     TAKE these medications    albuterol 108 (90 Base) MCG/ACT inhaler Commonly known as: VENTOLIN HFA Inhale 2  puffs into the lungs every 6 (six) hours as needed for wheezing or shortness of breath.   aspirin EC 81 MG tablet Take 1 tablet (81 mg total) by mouth daily. Swallow whole.   doxylamine (Sleep) 25 MG tablet Commonly known as: UNISOM Take 25 mg by mouth at bedtime as needed (nauea).   folic acid 400 MCG tablet Commonly known as: FOLVITE Take 400 mcg by mouth daily.   FreeStyle Libre 3 Sensor Misc 1 patch by Does not apply route every 14 (fourteen) days. Place 1 sensor on the skin every 14 days. Use to check glucose continuously   metFORMIN 1000 MG tablet Commonly known as: GLUCOPHAGE Take 1,000 mg by mouth 2 (two) times daily.   montelukast 10 MG tablet Commonly known as: SINGULAIR Take 1 tablet (10 mg total) by mouth at bedtime.   omeprazole 20 MG tablet Commonly known as: PriLOSEC OTC Take 1 tablet (20 mg total) by mouth daily.   ondansetron 4 MG disintegrating tablet Commonly known as: ZOFRAN-ODT Take 1 tablet (4 mg total) by mouth every 6 (six) hours as needed for nausea.   pyridOXINE 50 MG tablet Commonly known as: VITAMIN B6 Take 50 mg by mouth daily.   sertraline 100 MG tablet Commonly known as: ZOLOFT Take 1.5 tablets (150 mg total) by mouth daily.   TYLENOL PO Take by mouth as needed.         Total time spent taking care of this patient: 60 minutes  Signed: Mirna Mires, CNM  04/04/2023, 1:04 AM

## 2023-04-04 NOTE — Discharge Summary (Signed)
Please see Final Progress note  Mirna Mires, CNM  04/04/2023 12:49 AM

## 2023-04-04 NOTE — OB Triage Note (Signed)

## 2023-04-05 ENCOUNTER — Telehealth: Payer: Self-pay | Admitting: Internal Medicine

## 2023-04-05 ENCOUNTER — Encounter: Payer: Self-pay | Admitting: Obstetrics and Gynecology

## 2023-04-05 NOTE — Telephone Encounter (Signed)
Called pt let her know she needs to follow up with OBGYN. Pt verbalized understanding.  KP

## 2023-04-05 NOTE — Telephone Encounter (Signed)
Copied from CRM [id number]. Topic: Appointment Scheduling - Scheduling Inquiry for Clinic >> Apr 05, 2023  2:30 PM De Blanch wrote: Reason for CRM: Pt stated she is [redacted] weeks pregnant, and she ended up in the hospital on Monday for severe acid reflux, vomiting, blood, etc. Stated she was told to follow up with her GP. However, pt is calling and requesting an appointment with PCP Dr.Berglund.  FYI to the office, unsure if I should schedule pt.  Please advise.

## 2023-04-14 ENCOUNTER — Ambulatory Visit (INDEPENDENT_AMBULATORY_CARE_PROVIDER_SITE_OTHER): Payer: BC Managed Care – PPO | Admitting: Obstetrics and Gynecology

## 2023-04-14 ENCOUNTER — Encounter: Payer: Self-pay | Admitting: Obstetrics and Gynecology

## 2023-04-14 VITALS — BP 131/91 | HR 121 | Wt 219.2 lb

## 2023-04-14 DIAGNOSIS — O24410 Gestational diabetes mellitus in pregnancy, diet controlled: Secondary | ICD-10-CM

## 2023-04-14 DIAGNOSIS — K831 Obstruction of bile duct: Secondary | ICD-10-CM

## 2023-04-14 DIAGNOSIS — O0993 Supervision of high risk pregnancy, unspecified, third trimester: Secondary | ICD-10-CM

## 2023-04-14 DIAGNOSIS — F53 Postpartum depression: Secondary | ICD-10-CM

## 2023-04-14 DIAGNOSIS — Z3A32 32 weeks gestation of pregnancy: Secondary | ICD-10-CM

## 2023-04-14 DIAGNOSIS — F3341 Major depressive disorder, recurrent, in partial remission: Secondary | ICD-10-CM

## 2023-04-14 LAB — POCT URINALYSIS DIPSTICK OB
Bilirubin, UA: NEGATIVE
Blood, UA: NEGATIVE
Glucose, UA: NEGATIVE
Ketones, UA: NEGATIVE
Leukocytes, UA: NEGATIVE
POC,PROTEIN,UA: NEGATIVE
Spec Grav, UA: 1.025 (ref 1.010–1.025)
Urobilinogen, UA: 0.2 E.U./dL
pH, UA: 6 (ref 5.0–8.0)

## 2023-04-14 MED ORDER — ONDANSETRON HCL 4 MG PO TABS: 4.0000 mg | ORAL_TABLET | Freq: Three times a day (TID) | ORAL | 0 refills | Status: DC | PRN

## 2023-04-14 MED ORDER — SERTRALINE HCL 100 MG PO TABS
150.0000 mg | ORAL_TABLET | Freq: Every day | ORAL | 2 refills | Status: AC
Start: 2023-04-14 — End: ?

## 2023-04-14 NOTE — Progress Notes (Addendum)
ROB. Patient states daily fetal movement. She states over the last week having issues with itching mainly on hands, feet and stomach. Difficulty and pain with swallowing since L&D visit, states still occasionally vomiting and noticing a tender area on her right side. Sugar log is on her phone.

## 2023-04-14 NOTE — Progress Notes (Signed)
ROB: Complains of 1 week of itching all over her whole body.  She has also had significant right upper quadrant discomfort and persistent and worsening nausea and vomiting.  Was seen in triage for nausea and vomiting.  She cannot identify if this is postprandial or not.  Review of her sugars (diet-controlled gestational diabetes) shows excellent control. PLAN: Cholestasis labs for itching  Right upper quadrant ultrasound to rule out gallstones -discussed low-fat diet  Growth scan in 2 weeks for diet-controlled diabetes

## 2023-04-16 LAB — COMPREHENSIVE METABOLIC PANEL
ALT: 23 IU/L (ref 0–32)
AST: 19 IU/L (ref 0–40)
Albumin/Globulin Ratio: 1.4 (ref 1.2–2.2)
Albumin: 2.9 g/dL — ABNORMAL LOW (ref 3.9–4.9)
Alkaline Phosphatase: 121 IU/L (ref 44–121)
BUN/Creatinine Ratio: 15 (ref 9–23)
BUN: 9 mg/dL (ref 6–20)
Bilirubin Total: <0.2 mg/dL (ref 0.0–1.2)
CO2: 20 mmol/L (ref 20–29)
Calcium: 8.5 mg/dL — ABNORMAL LOW (ref 8.7–10.2)
Chloride: 104 mmol/L (ref 96–106)
Creatinine, Ser: 0.61 mg/dL (ref 0.57–1.00)
Globulin, Total: 2.1 g/dL (ref 1.5–4.5)
Glucose: 82 mg/dL (ref 70–99)
Potassium: 4.5 mmol/L (ref 3.5–5.2)
Sodium: 136 mmol/L (ref 134–144)
Total Protein: 5 g/dL — ABNORMAL LOW (ref 6.0–8.5)
eGFR: 122 mL/min/{1.73_m2} (ref >59)

## 2023-04-16 LAB — BILE ACIDS, TOTAL: Bile Acids Total: 8.9 umol/L (ref 0.0–10.0)

## 2023-04-18 ENCOUNTER — Ambulatory Visit (INDEPENDENT_AMBULATORY_CARE_PROVIDER_SITE_OTHER): Payer: BC Managed Care – PPO

## 2023-04-18 DIAGNOSIS — K831 Obstruction of bile duct: Secondary | ICD-10-CM

## 2023-04-18 DIAGNOSIS — R112 Nausea with vomiting, unspecified: Secondary | ICD-10-CM | POA: Diagnosis not present

## 2023-04-24 ENCOUNTER — Other Ambulatory Visit: Payer: Self-pay

## 2023-04-24 ENCOUNTER — Encounter: Payer: Self-pay | Admitting: Advanced Practice Midwife

## 2023-04-24 ENCOUNTER — Other Ambulatory Visit: Payer: Self-pay | Admitting: Advanced Practice Midwife

## 2023-04-24 ENCOUNTER — Other Ambulatory Visit: Payer: BC Managed Care – PPO

## 2023-04-24 ENCOUNTER — Observation Stay
Admission: EM | Admit: 2023-04-24 | Discharge: 2023-04-24 | Disposition: A | Discharge status: Home / Self Care | Payer: BC Managed Care – PPO | Attending: Obstetrics and Gynecology | Admitting: Obstetrics and Gynecology

## 2023-04-24 DIAGNOSIS — O24419 Gestational diabetes mellitus in pregnancy, unspecified control: Secondary | ICD-10-CM | POA: Diagnosis not present

## 2023-04-24 DIAGNOSIS — Z8616 Personal history of COVID-19: Secondary | ICD-10-CM | POA: Insufficient documentation

## 2023-04-24 DIAGNOSIS — Z3A34 34 weeks gestation of pregnancy: Secondary | ICD-10-CM | POA: Insufficient documentation

## 2023-04-24 DIAGNOSIS — Z91040 Latex allergy status: Secondary | ICD-10-CM | POA: Diagnosis not present

## 2023-04-24 DIAGNOSIS — O163 Unspecified maternal hypertension, third trimester: Secondary | ICD-10-CM

## 2023-04-24 DIAGNOSIS — Z7984 Long term (current) use of oral hypoglycemic drugs: Secondary | ICD-10-CM | POA: Diagnosis not present

## 2023-04-24 DIAGNOSIS — Z79899 Other long term (current) drug therapy: Secondary | ICD-10-CM | POA: Insufficient documentation

## 2023-04-24 DIAGNOSIS — Z3480 Encounter for supervision of other normal pregnancy, unspecified trimester: Secondary | ICD-10-CM

## 2023-04-24 DIAGNOSIS — O368130 Decreased fetal movements, third trimester, not applicable or unspecified: Secondary | ICD-10-CM | POA: Diagnosis not present

## 2023-04-24 NOTE — Progress Notes (Signed)
Orders placed for clinic collect [facility] labs. Patient just left L&D following evaluation for decreased fetal movement. Noticed BP mildly elevated after she discharged. History of mild preeclampsia.

## 2023-04-24 NOTE — Discharge Summary (Signed)
Physician Final Progress Note  Patient ID: Brianna Marshall MRN: [id number] DOB/AGE: 1991-07-09 32 y.o.  Admit date: 04/24/2023 Admitting provider: Tresea Mall, CNM Discharge date: 04/24/2023   Admission Diagnoses:  1) intrauterine pregnancy at [redacted]w[redacted]d  2) decreased fetal movement  Discharge Diagnoses:  Principal Problem:   Labor and delivery, indication for care Active Problems:   [redacted] weeks gestation of pregnancy  Reactive NST  Positive fetal movement  History of Present Illness: The patient is a 32 y.o. female G2P1001 at [redacted]w[redacted]d who presents for decreased fetal movement since yesterday not resolving with usual measures. She denies contractions, vaginal bleeding, leakage of fluid. She was admitted for observation and placed on monitors. She feels some movement since arriving in triage. She has no other concerns and is discharged to home with instructions and precautions.   I noticed her blood pressure was mildly elevated after she had already discharged. She was able to go over to the office for [facility] labs after leaving the hospital. She has ROB on Friday.   Past Medical History:  Diagnosis Date   Anemia in pregnancy 05/30/2018   Antepartum mild preeclampsia, third trimester 07/30/2018   Asthma    COVID-19 02/2020   Depression    Family history of pancreatic cancer    Gestational diabetes 06/11/2018   Gestational diabetes mellitus (GDM) affecting third pregnancy 07/29/2018   Headache, migraine 07/14/2015   approx 2x/month   Hyperemesis 06/05/2018   Infertility, anovulation 03/10/2017   Nausea/vomiting in pregnancy 01/17/2018   Obesity    PCOS (polycystic ovarian syndrome) 07/08/2016   Prediabetes 07/08/2016   Hgb 5.8  04/2015    Supervision of high risk pregnancy, antepartum 01/02/2018   Clinic [facility] Prenatal Labs Dating Early [place] Blood type: O/Positive/-- (01/29 1038)  Genetic Screen 1 Screen: Neg Antibody:Negative (01/29 1038) Anatomic [place] Normal, anterior  placenta,  Rubella: <0.90 (01/29 1038) Varicella: Imm GTT Early: 123 Third trimester:  RPR: Non Reactive (01/29 1038)  Rhogam n/a HBsAg: Negative (01/29 1038)  TDaP vaccine        05/30/2018    Flu Shot: 09/2017 HIV: Non Reac   Vertigo    for short time after covid   Vitamin D deficiency 07/08/2016   18.7 in 04/2015     Past Surgical History:  Procedure Laterality Date   ENDOSCOPIC CONCHA BULLOSA RESECTION Right 12/30/2021   Procedure: RIGHT CONCHA BULLOSA REDUCTION VIA NASAL ENDOSCOPY;  Surgeon: Vernie Murders, MD;  Location: [facility];  Service: ENT;  Laterality: Right;   INCISE AND DRAIN ABCESS Right 2011   Posterior Thigh   sebaceous cyst removal  07/2017   Back of neck   SEPTOPLASTY N/A 12/30/2021   Procedure: SEPTOPLASTY;  Surgeon: Vernie Murders, MD;  Location: [facility];  Service: ENT;  Laterality: N/A;   TURBINATE REDUCTION Right 12/30/2021   Procedure: PARTIAL EXCISION RIGHT INFERIOR TURBINATE;  Surgeon: Vernie Murders, MD;  Location: [facility];  Service: ENT;  Laterality: Right;   WISDOM TOOTH EXTRACTION      No current facility-administered medications on file prior to encounter.   Current Outpatient Medications on File Prior to Encounter  Medication Sig Dispense Refill   Acetaminophen (TYLENOL PO) Take by mouth as needed.     albuterol (VENTOLIN HFA) 108 (90 Base) MCG/ACT inhaler Inhale 2 puffs into the lungs every 6 (six) hours as needed for wheezing or shortness of breath. 6.7 g 11   aspirin EC 81 MG tablet Take 1 tablet (81 mg total) by  mouth daily. Swallow whole. 30 tablet 12   doxylamine, Sleep, (UNISOM) 25 MG tablet Take 25 mg by mouth at bedtime as needed (nauea).     folic acid (FOLVITE) 400 MCG tablet Take 400 mcg by mouth daily.     montelukast (SINGULAIR) 10 MG tablet Take 1 tablet (10 mg total) by mouth at bedtime. 90 tablet 1   omeprazole (PRILOSEC OTC) 20 MG tablet Take 1 tablet (20 mg total) by mouth daily. 30 tablet 6   ondansetron  (ZOFRAN-ODT) 4 MG disintegrating tablet Take 1 tablet (4 mg total) by mouth every 6 (six) hours as needed for nausea. 20 tablet 2   pyridOXINE (VITAMIN B6) 50 MG tablet Take 50 mg by mouth daily.     sertraline (ZOLOFT) 100 MG tablet Take 1.5 tablets (150 mg total) by mouth daily. 45 tablet 2   Continuous Blood Gluc Sensor (FREESTYLE LIBRE 3 SENSOR) MISC 1 patch by Does not apply route every 14 (fourteen) days. Place 1 sensor on the skin every 14 days. Use to check glucose continuously 2 each 8   metFORMIN (GLUCOPHAGE) 1000 MG tablet Take 1,000 mg by mouth 2 (two) times daily. (Patient not taking: Reported on 04/24/2023)      Allergies  Allergen Reactions   Shellfish Allergy Nausea And Vomiting and Nausea Only   Latex Dermatitis   Amoxicillin Other (See Comments) and Rash   Lidocaine Rash    topical    Social History   Socioeconomic History   Marital status: Married    Spouse name: Denyse Amass   Number of children: 1   Years of education: 12   Highest education level: High school graduate  Occupational History   Occupation: Printmaker  Tobacco Use   Smoking status: Never   Smokeless tobacco: Never  Vaping Use   Vaping Use: Never used  Substance and Sexual Activity   Alcohol use: Not Currently    Alcohol/week: 0.0 standard drinks of alcohol    Comment: rarely   Drug use: No   Sexual activity: Yes    Partners: Male    Birth control/protection: None  Other Topics Concern   Not on file  Social History Narrative   Not on file   Social Determinants of Health   Financial Resource Strain: Medium Risk (11/16/2022)   Overall Financial Resource Strain (CARDIA)    Difficulty of Paying Living Expenses: Somewhat hard  Food Insecurity: Food Insecurity Present (11/16/2022)   Hunger Vital Sign    Worried About Running Out of Food in the Last Year: Sometimes true    Ran Out of Food in the Last Year: Sometimes true  Transportation Needs: No Transportation Needs (06/14/2022)   PRAPARE -  Administrator, Civil Service (Medical): No    Lack of Transportation (Non-Medical): No  Physical Activity: Insufficiently Active (11/16/2022)   Exercise Vital Sign    Days of Exercise per Week: 3 days    Minutes of Exercise per Session: 20 min  Stress: No Stress Concern Present (11/16/2022)   [employer] of Occupational Health - Occupational Stress Questionnaire    Feeling of Stress : Only a little  Social Connections: Moderately Isolated (11/16/2022)   Social Connection and Isolation Panel [NHANES]    Frequency of Communication with Friends and Family: More than three times a week    Frequency of Social Gatherings with Friends and Family: Never    Attends Religious Services: Never    Database administrator or Organizations: No  Attends Banker Meetings: Never    Marital Status: Married  Catering manager Violence: Not At Risk (11/16/2022)   Humiliation, Afraid, Rape, and Kick questionnaire    Fear of Current or Ex-Partner: No    Emotionally Abused: No    Physically Abused: No    Sexually Abused: No    Family History  Problem Relation Age of Onset   Osteoporosis Mother    Alcohol abuse Father    Drug abuse Father    Healthy Sister    ADD / ADHD Brother    OCD Brother    Diabetes Maternal Grandmother    Pancreatic cancer Maternal Grandmother 70   Depression Paternal Grandmother    COPD Paternal Grandmother    Alcohol abuse Maternal Aunt    Bipolar disorder Maternal Aunt    Diabetes Maternal Aunt    Breast cancer Maternal Aunt 61       BRCA neg   Diabetes Maternal Uncle    Prostate cancer Maternal Uncle      Review of Systems  Constitutional:  Negative for chills and fever.  HENT:  Negative for congestion, ear discharge, ear pain, hearing loss, sinus pain and sore throat.   Eyes:  Negative for blurred vision and double vision.  Respiratory:  Negative for cough, shortness of breath and wheezing.   Cardiovascular:  Negative for chest  pain, palpitations and leg swelling.  Gastrointestinal:  Negative for abdominal pain, blood in stool, constipation, diarrhea, heartburn, melena, nausea and vomiting.  Genitourinary:  Negative for dysuria, flank pain, frequency, hematuria and urgency.  Musculoskeletal:  Negative for back pain, joint pain and myalgias.  Skin:  Negative for itching and rash.  Neurological:  Negative for dizziness, tingling, tremors, sensory change, speech change, focal weakness, seizures, loss of consciousness, weakness and headaches.  Endo/Heme/Allergies:  Negative for environmental allergies. Does not bruise/bleed easily.  Psychiatric/Behavioral:  Negative for depression, hallucinations, memory loss, substance abuse and suicidal ideas. The patient is not nervous/anxious and does not have insomnia.      Physical Exam: BP (!) 142/99   Pulse (!) 136   Temp 98.1 F (36.7 C) (Oral)   Resp 16   Ht 5\' 8"  (1.727 m)   Wt 99.5 kg   BMI 33.35 kg/m   Constitutional: Well nourished, well developed female in no acute distress.  HEENT: normal Skin: Warm and dry.  Cardiovascular: Regular rate and rhythm.   Extremity:  trace edema   Respiratory: Clear to auscultation bilateral. Normal respiratory effort Abdomen: FHT present Psych: Alert and Oriented x3. No memory deficits. Normal mood and affect.   Toco: negative Fetal well being: 145 bpm, moderate variability, +accelerations, -decelerations  Consults: None  Significant Findings/ Diagnostic Studies: none Labs to be done at [facility] clinic this afternoon  Procedures: NST  Hospital Course: The patient was admitted to Labor and Delivery Triage for observation.   Discharge Condition: good  Disposition: Discharge disposition: 01-Home or Self Care  Diet: Diabetic diet  Discharge Activity: Activity as tolerated  Discharge Instructions     Discharge activity:  No Restrictions   Complete by: As directed    Discharge diet:   Complete by: As directed     Carb modified diet   Fetal Kick Count:  Lie on our left side for one hour after a meal, and count the number of times your baby kicks.  If it is less than 5 times, get up, move around and drink some juice.  Repeat the test 30 minutes  later.  If it is still less than 5 kicks in an hour, notify your doctor.   Complete by: As directed       Allergies as of 04/24/2023       Reactions   Shellfish Allergy Nausea And Vomiting, Nausea Only   Latex Dermatitis   Amoxicillin Other (See Comments), Rash   Lidocaine Rash   topical        Medication List     TAKE these medications    albuterol 108 (90 Base) MCG/ACT inhaler Commonly known as: VENTOLIN HFA Inhale 2 puffs into the lungs every 6 (six) hours as needed for wheezing or shortness of breath.   aspirin EC 81 MG tablet Take 1 tablet (81 mg total) by mouth daily. Swallow whole.   doxylamine (Sleep) 25 MG tablet Commonly known as: UNISOM Take 25 mg by mouth at bedtime as needed (nauea).   folic acid 400 MCG tablet Commonly known as: FOLVITE Take 400 mcg by mouth daily.   FreeStyle Libre 3 Sensor Misc 1 patch by Does not apply route every 14 (fourteen) days. Place 1 sensor on the skin every 14 days. Use to check glucose continuously   metFORMIN 1000 MG tablet Commonly known as: GLUCOPHAGE Take 1,000 mg by mouth 2 (two) times daily.   montelukast 10 MG tablet Commonly known as: SINGULAIR Take 1 tablet (10 mg total) by mouth at bedtime.   omeprazole 20 MG tablet Commonly known as: PriLOSEC OTC Take 1 tablet (20 mg total) by mouth daily.   ondansetron 4 MG disintegrating tablet Commonly known as: ZOFRAN-ODT Take 1 tablet (4 mg total) by mouth every 6 (six) hours as needed for nausea.   pyridOXINE 50 MG tablet Commonly known as: VITAMIN B6 Take 50 mg by mouth daily.   sertraline 100 MG tablet Commonly known as: ZOLOFT Take 1.5 tablets (150 mg total) by mouth daily.   TYLENOL PO Take by mouth as needed.          Total time spent taking care of this patient: 20 minutes  Signed: Tresea Mall, CNM  04/24/2023, 3:19 PM

## 2023-04-24 NOTE — OB Triage Note (Signed)
Pt discharged home stable and ambulatory. Pt showed reactive NST w no decelerations noted. Kick counts were explained and discharge instructions reviewed, pt verbalized understanding. Pt educated to return if she experiences lof, vaginal bleeding, contractions, or abdominal cramping. Pt discharged by Higinio Roger, CNM.

## 2023-04-24 NOTE — OB Triage Note (Signed)
G2P1001 presents to L&D triage at [redacted]w[redacted]d w c/o reduced fetal movement. She reports not feeling her baby since waking up this morning, but denies lof, vaginal bleeding, contractions, and pelvic/abdominal pressure. Fetal monitors applied and assessing, fetal heart tone found quickly. [place] CNM notified.

## 2023-04-25 LAB — PROTEIN / CREATININE RATIO, URINE
Creatinine, Urine: 148.2 mg/dL
Protein, Ur: 54 mg/dL
Protein/Creat Ratio: 364 mg/g creat — ABNORMAL HIGH (ref 0–200)

## 2023-04-25 LAB — COMPREHENSIVE METABOLIC PANEL
ALT: 15 IU/L (ref 0–32)
AST: 20 IU/L (ref 0–40)
Albumin/Globulin Ratio: 1.4 (ref 1.2–2.2)
Albumin: 3.1 g/dL — ABNORMAL LOW (ref 3.9–4.9)
Alkaline Phosphatase: 146 IU/L — ABNORMAL HIGH (ref 44–121)
BUN/Creatinine Ratio: 11 (ref 9–23)
BUN: 7 mg/dL (ref 6–20)
Bilirubin Total: <0.2 mg/dL (ref 0.0–1.2)
CO2: 17 mmol/L — ABNORMAL LOW (ref 20–29)
Calcium: 8.9 mg/dL (ref 8.7–10.2)
Chloride: 104 mmol/L (ref 96–106)
Creatinine, Ser: 0.63 mg/dL (ref 0.57–1.00)
Globulin, Total: 2.2 g/dL (ref 1.5–4.5)
Glucose: 126 mg/dL — ABNORMAL HIGH (ref 70–99)
Potassium: 4.3 mmol/L (ref 3.5–5.2)
Sodium: 136 mmol/L (ref 134–144)
Total Protein: 5.3 g/dL — ABNORMAL LOW (ref 6.0–8.5)
eGFR: 122 mL/min/{1.73_m2} (ref >59)

## 2023-04-25 LAB — CBC
Hematocrit: 33.3 % — ABNORMAL LOW (ref 34.0–46.6)
Hemoglobin: 10.6 g/dL — ABNORMAL LOW (ref 11.1–15.9)
MCH: 26.4 pg — ABNORMAL LOW (ref 26.6–33.0)
MCHC: 31.8 g/dL (ref 31.5–35.7)
MCV: 83 fL (ref 79–97)
Platelets: 293 10*3/uL (ref 150–450)
RBC: 4.01 x10E6/uL (ref 3.77–5.28)
RDW: 13.6 % (ref 11.7–15.4)
WBC: 8.5 10*3/uL (ref 3.4–10.8)

## 2023-04-28 ENCOUNTER — Ambulatory Visit (INDEPENDENT_AMBULATORY_CARE_PROVIDER_SITE_OTHER): Payer: BC Managed Care – PPO

## 2023-04-28 ENCOUNTER — Ambulatory Visit (INDEPENDENT_AMBULATORY_CARE_PROVIDER_SITE_OTHER): Payer: BC Managed Care – PPO | Admitting: Advanced Practice Midwife

## 2023-04-28 ENCOUNTER — Other Ambulatory Visit: Payer: Self-pay

## 2023-04-28 ENCOUNTER — Observation Stay
Admission: EM | Admit: 2023-04-28 | Discharge: 2023-04-28 | Disposition: A | Discharge status: Home / Self Care | Payer: BC Managed Care – PPO | Attending: Obstetrics and Gynecology | Admitting: Obstetrics and Gynecology

## 2023-04-28 ENCOUNTER — Encounter: Payer: Self-pay | Admitting: Advanced Practice Midwife

## 2023-04-28 ENCOUNTER — Encounter: Payer: Self-pay | Admitting: Obstetrics and Gynecology

## 2023-04-28 VITALS — BP 140/102 | HR 102 | Wt 223.0 lb

## 2023-04-28 DIAGNOSIS — W010XXA Fall on same level from slipping, tripping and stumbling without subsequent striking against object, initial encounter: Secondary | ICD-10-CM | POA: Diagnosis not present

## 2023-04-28 DIAGNOSIS — O99513 Diseases of the respiratory system complicating pregnancy, third trimester: Secondary | ICD-10-CM | POA: Diagnosis not present

## 2023-04-28 DIAGNOSIS — O4703 False labor before 37 completed weeks of gestation, third trimester: Secondary | ICD-10-CM | POA: Insufficient documentation

## 2023-04-28 DIAGNOSIS — O24419 Gestational diabetes mellitus in pregnancy, unspecified control: Secondary | ICD-10-CM

## 2023-04-28 DIAGNOSIS — O479 False labor, unspecified: Secondary | ICD-10-CM | POA: Diagnosis present

## 2023-04-28 DIAGNOSIS — Z3A34 34 weeks gestation of pregnancy: Secondary | ICD-10-CM

## 2023-04-28 DIAGNOSIS — W19XXXA Unspecified fall, initial encounter: Secondary | ICD-10-CM

## 2023-04-28 DIAGNOSIS — O24415 Gestational diabetes mellitus in pregnancy, controlled by oral hypoglycemic drugs: Secondary | ICD-10-CM

## 2023-04-28 DIAGNOSIS — O24410 Gestational diabetes mellitus in pregnancy, diet controlled: Secondary | ICD-10-CM | POA: Diagnosis not present

## 2023-04-28 DIAGNOSIS — O9A213 Injury, poisoning and certain other consequences of external causes complicating pregnancy, third trimester: Secondary | ICD-10-CM | POA: Diagnosis present

## 2023-04-28 DIAGNOSIS — Z91040 Latex allergy status: Secondary | ICD-10-CM | POA: Diagnosis not present

## 2023-04-28 DIAGNOSIS — Z8616 Personal history of COVID-19: Secondary | ICD-10-CM | POA: Insufficient documentation

## 2023-04-28 DIAGNOSIS — Z7982 Long term (current) use of aspirin: Secondary | ICD-10-CM | POA: Insufficient documentation

## 2023-04-28 DIAGNOSIS — Z7984 Long term (current) use of oral hypoglycemic drugs: Secondary | ICD-10-CM | POA: Diagnosis not present

## 2023-04-28 DIAGNOSIS — Z79899 Other long term (current) drug therapy: Secondary | ICD-10-CM | POA: Insufficient documentation

## 2023-04-28 DIAGNOSIS — O0993 Supervision of high risk pregnancy, unspecified, third trimester: Secondary | ICD-10-CM

## 2023-04-28 DIAGNOSIS — O1493 Unspecified pre-eclampsia, third trimester: Secondary | ICD-10-CM

## 2023-04-28 DIAGNOSIS — M25561 Pain in right knee: Secondary | ICD-10-CM | POA: Insufficient documentation

## 2023-04-28 DIAGNOSIS — O219 Vomiting of pregnancy, unspecified: Secondary | ICD-10-CM

## 2023-04-28 DIAGNOSIS — Y92019 Unspecified place in single-family (private) house as the place of occurrence of the external cause: Secondary | ICD-10-CM | POA: Insufficient documentation

## 2023-04-28 DIAGNOSIS — O403XX0 Polyhydramnios, third trimester, not applicable or unspecified: Secondary | ICD-10-CM | POA: Insufficient documentation

## 2023-04-28 DIAGNOSIS — O1403 Mild to moderate pre-eclampsia, third trimester: Secondary | ICD-10-CM | POA: Diagnosis not present

## 2023-04-28 LAB — POCT URINALYSIS DIPSTICK OB
Bilirubin, UA: NEGATIVE
Blood, UA: NEGATIVE
Glucose, UA: NEGATIVE
Ketones, UA: NEGATIVE
Leukocytes, UA: NEGATIVE
Nitrite, UA: NEGATIVE
Spec Grav, UA: 1.015 (ref 1.010–1.025)
Urobilinogen, UA: 1 E.U./dL
pH, UA: 6 (ref 5.0–8.0)

## 2023-04-28 MED ORDER — ONDANSETRON 4 MG PO TBDP
4.0000 mg | ORAL_TABLET | Freq: Four times a day (QID) | ORAL | 2 refills | Status: DC | PRN
Start: 2023-04-28 — End: 2023-06-15

## 2023-04-28 MED ORDER — ACETAMINOPHEN 325 MG PO TABS
650.0000 mg | ORAL_TABLET | Freq: Four times a day (QID) | ORAL | Status: DC | PRN
  Administered 2023-04-28: 650 mg via ORAL
  Filled 2023-04-28: qty 2

## 2023-04-28 MED ORDER — LACTATED RINGERS IV BOLUS
1000.0000 mL | Freq: Once | INTRAVENOUS | Status: AC
  Administered 2023-04-28: 1000 mL via INTRAVENOUS

## 2023-04-28 NOTE — Progress Notes (Signed)
Routine Prenatal Care Visit  Subjective  Brianna Marshall is a 32 y.o. G2P1001 at [redacted]w[redacted]d being seen today for ongoing prenatal care.  She is currently monitored for the following issues for this high-risk pregnancy and has PCOS (polycystic ovarian syndrome); Prediabetes; Vitamin D deficiency; Insulin resistance; Major depression in partial remission (HCC); Gestational diabetes mellitus (GDM) controlled on oral hypoglycemic drug; Preeclampsia; Anxiety; Migraine without aura and without status migrainosus, not intractable; Sleep disorder; Alopecia; Fatigue; Supervision of high-risk pregnancy; Obesity in pregnancy; History of diet controlled gestational diabetes mellitus (GDM); Nausea and vomiting in pregnancy; Labor and delivery, indication for care; [redacted] weeks gestation of pregnancy; and Polyhydramnios affecting pregnancy in third trimester on their problem list.  ----------------------------------------------------------------------------------- Patient reports  no headaches or visual changes. She reports ongoing RUQ pain. She does have some dizziness. Recent labs have been normal with the exception of P/C ratio- mild preeclampsia. She reports some pelvic pressure. We discussed u/s today- polyhydramnios. Given that and GDM/Preex will send referral to MFM for consult/growth scan/delivery recommendations. She forgot to bring her log today and reports fasting BS are usually less than 90. After meals usually less than 120. She has not been taking metformin during the pregnancy .   Contractions: Not present. Vag. Bleeding: None.  Movement: Present. Leaking Fluid denies.  ----------------------------------------------------------------------------------- The following portions of the patient's history were reviewed and updated as appropriate: allergies, current medications, past family history, past medical history, past social history, past surgical history and problem list. Problem list  updated.  Objective  Blood pressure (!) 140/102, pulse (!) 102, weight 223 lb (101.2 kg). Pregravid weight 230 lb (104.3 kg) Total Weight Gain -7 lb (-3.175 kg) Urinalysis: Urine Protein Small (1+)  Urine Glucose Negative  Fetal Status: Fetal Heart Rate (bpm): 149   Movement: Present     Ultrasound: Growth 88.7%. AC >97%, AFI 24.17  General:  Alert, oriented and cooperative. Patient is in no acute distress.  Skin: Skin is warm and dry. No rash noted.   Cardiovascular: Normal heart rate noted  Respiratory: Normal respiratory effort, no problems with respiration noted  Abdomen: Soft, gravid, appropriate for gestational age. Pain/Pressure: Present     Pelvic:  Cervical exam deferred        Extremities: Normal range of motion.  Edema: Trace  Mental Status: Normal mood and affect. Normal behavior. Normal judgment and thought content.   Assessment   32 y.o. G2P1001 at [redacted]w[redacted]d by  06/03/2023, by Ultrasound presenting for routine prenatal visit  Plan   SECOND Problems (from 11/16/22 to present)     Problem Noted Resolved   Supervision of high-risk pregnancy 11/16/2022 by Donnetta Hail, CMA No   Overview Addendum 03/14/2023  3:32 PM by Mirna Mires, CNM     Clinical Staff Provider  Office Location  [facility] Dating  Not found.  Language  English Anatomy US    Flu Vaccine  10/07/22 Genetic Screen  NIPS:   TDaP vaccine   Offer Hgb A1C or  GTT Early : Third trimester :   Covid    LAB RESULTS   Rhogam  O/Positive/-- (12/01 1022) N/A Blood Type O/Positive/-- (12/01 1022)   Feeding Plan Formula Antibody Negative (12/01 1022)  Contraception OCP's Rubella 3.40 (12/01 1022)  Circumcision No RPR Non Reactive (03/11 1134)   Pediatrician  [facility] HBsAg Negative (12/01 1022)   Support Person Denyse Amass HIV Non Reactive (03/11 1134)  Prenatal Classes No Varicella     GBS  (For PCN  allergy, check sensitivities)   BTL Consent  Hep C Non Reactive (12/01 1022)   VBAC Consent  Pap  Diagnosis  Date Value Ref Range Status  12/23/2022   Final   - Negative for Intraepithelial Lesions or Malignancy (NILM)  12/23/2022 - Benign reactive/reparative changes  Final      Hgb Electro      CF      SMA                    Preterm labor symptoms and general obstetric precautions including but not limited to vaginal bleeding, contractions, leaking of fluid and fetal movement were reviewed in detail with the patient. Please refer to After Visit Summary for other counseling recommendations.   Return in about 1 week (around 05/05/2023) for  rob, also needs consult with MFM asap.  Tresea Mall, CNM 04/28/2023 10:20 AM

## 2023-04-28 NOTE — OB Triage Note (Signed)
Patient is a G2P1, [redacted]w[redacted]d that presents with pain after a fall at home over her baby gate at approximately 1730 today. Patient states that her right foot caught the baby gate and she fell on her right side, with her right knee catching most of the fall. Patient reports +FM. No LOF or bleeding. Patient's right knee is swollen with a hematoma, patient rates pain an 8 on a 0-10 pain scale, patient mentions more pain when weight is placed on right leg. Patient states that she has been having right sided pain at home already, underneath her right rib and she was evaluated in office this am and CNM told her it was baby positioning. RN palpated abdomen, no tenderness, baby palpates mainly on right side. Patient does feel tightening in abdomen from time to time, pain is rated a 6 on a 0-10 pain scale. Patient mentioned that her right wrist has pain that radiates to her elbow and she rates that pain a 3 on a 0-10 pain scale. VSS. Initial BP 142/88, patient states that CNM advised her at morning visit that she has pre-eclampsia. Patient has gestational diabetes that is diet controlled.

## 2023-04-28 NOTE — Final Progress Note (Signed)
OB/Triage Note  Patient ID: Brianna Marshall MRN: [id number] DOB/AGE: 02-18-1991 32 y.o.  Subjective  History of Present Illness: The patient is a 32 y.o. female G2P1001 at [redacted]w[redacted]d who presents following a fall at home. She tripped on a baby gate and fell down, her right knee took most of the fall but also fell on her right side. Reports knee pain, endorses fetal movement. Fall occurred about  1730 today. After the fall she started having contractions which she rates a 3/10, states knee pain is worse. Denies VB, LOF, HA, visual changes. Does report having a panic attack after falling.  Pregnancy has been complicated by recent dx of preeclampsia four days ago by a PC ratio of 0.36 (has been referred to MFM but not seen yet), obesity, GDM diet controlled, depression, anxiety, migraines without aura, sleep disorder, fatigue and polyhydramnios.   Past Medical History:  Diagnosis Date   Anemia in pregnancy 05/30/2018   Antepartum mild preeclampsia, third trimester 07/30/2018   Asthma    COVID-19 02/2020   Depression    Family history of pancreatic cancer    Gestational diabetes 06/11/2018   Gestational diabetes mellitus (GDM) affecting third pregnancy 07/29/2018   Headache, migraine 07/14/2015   approx 2x/month   Hyperemesis 06/05/2018   Infertility, anovulation 03/10/2017   Nausea/vomiting in pregnancy 01/17/2018   Obesity    PCOS (polycystic ovarian syndrome) 07/08/2016   Prediabetes 07/08/2016   Hgb 5.8  04/2015    Supervision of high risk pregnancy, antepartum 01/02/2018   Clinic [facility] Prenatal Labs Dating Early [place] Blood type: O/Positive/-- (01/29 1038)  Genetic Screen 1 Screen: Neg Antibody:Negative (01/29 1038) Anatomic [place] Normal, anterior placenta,  Rubella: <0.90 (01/29 1038) Varicella: Imm GTT Early: 123 Third trimester:  RPR: Non Reactive (01/29 1038)  Rhogam n/a HBsAg: Negative (01/29 1038)  TDaP vaccine        05/30/2018    Flu Shot: 09/2017 HIV: Non Reac    Vertigo    for short time after covid   Vitamin D deficiency 07/08/2016   18.7 in 04/2015     Past Surgical History:  Procedure Laterality Date   ENDOSCOPIC CONCHA BULLOSA RESECTION Right 12/30/2021   Procedure: RIGHT CONCHA BULLOSA REDUCTION VIA NASAL ENDOSCOPY;  Surgeon: Vernie Murders, MD;  Location: [facility];  Service: ENT;  Laterality: Right;   INCISE AND DRAIN ABCESS Right 2011   Posterior Thigh   sebaceous cyst removal  07/2017   Back of neck   SEPTOPLASTY N/A 12/30/2021   Procedure: SEPTOPLASTY;  Surgeon: Vernie Murders, MD;  Location: [facility];  Service: ENT;  Laterality: N/A;   TURBINATE REDUCTION Right 12/30/2021   Procedure: PARTIAL EXCISION RIGHT INFERIOR TURBINATE;  Surgeon: Vernie Murders, MD;  Location: [facility];  Service: ENT;  Laterality: Right;   WISDOM TOOTH EXTRACTION      No current facility-administered medications on file prior to encounter.   Current Outpatient Medications on File Prior to Encounter  Medication Sig Dispense Refill   aspirin EC 81 MG tablet Take 1 tablet (81 mg total) by mouth daily. Swallow whole. 30 tablet 12   Continuous Blood Gluc Sensor (FREESTYLE LIBRE 3 SENSOR) MISC 1 patch by Does not apply route every 14 (fourteen) days. Place 1 sensor on the skin every 14 days. Use to check glucose continuously 2 each 8   doxylamine, Sleep, (UNISOM) 25 MG tablet Take 25 mg by mouth at bedtime as needed (nauea).     folic acid (FOLVITE)  400 MCG tablet Take 400 mcg by mouth daily.     montelukast (SINGULAIR) 10 MG tablet Take 1 tablet (10 mg total) by mouth at bedtime. 90 tablet 1   omeprazole (PRILOSEC OTC) 20 MG tablet Take 1 tablet (20 mg total) by mouth daily. 30 tablet 6   ondansetron (ZOFRAN-ODT) 4 MG disintegrating tablet Take 1 tablet (4 mg total) by mouth every 6 (six) hours as needed for nausea. 20 tablet 2   pyridOXINE (VITAMIN B6) 50 MG tablet Take 50 mg by mouth daily.     sertraline (ZOLOFT) 100 MG tablet Take  1.5 tablets (150 mg total) by mouth daily. 45 tablet 2   Acetaminophen (TYLENOL PO) Take by mouth as needed.     albuterol (VENTOLIN HFA) 108 (90 Base) MCG/ACT inhaler Inhale 2 puffs into the lungs every 6 (six) hours as needed for wheezing or shortness of breath. 6.7 g 11   metFORMIN (GLUCOPHAGE) 1000 MG tablet Take 1,000 mg by mouth 2 (two) times daily. (Patient not taking: Reported on 04/24/2023)      Allergies  Allergen Reactions   Shellfish Allergy Nausea And Vomiting and Nausea Only   Latex Dermatitis   Amoxicillin Other (See Comments) and Rash   Lidocaine Rash    topical    Social History   Socioeconomic History   Marital status: Married    Spouse name: Denyse Amass   Number of children: 1   Years of education: 12   Highest education level: High school graduate  Occupational History   Occupation: Printmaker  Tobacco Use   Smoking status: Never   Smokeless tobacco: Never  Vaping Use   Vaping Use: Never used  Substance and Sexual Activity   Alcohol use: Not Currently    Alcohol/week: 0.0 standard drinks of alcohol    Comment: rarely   Drug use: No   Sexual activity: Yes    Partners: Male    Birth control/protection: None  Other Topics Concern   Not on file  Social History Narrative   Not on file   Social Determinants of Health   Financial Resource Strain: Medium Risk (11/16/2022)   Overall Financial Resource Strain (CARDIA)    Difficulty of Paying Living Expenses: Somewhat hard  Food Insecurity: Food Insecurity Present (11/16/2022)   Hunger Vital Sign    Worried About Running Out of Food in the Last Year: Sometimes true    Ran Out of Food in the Last Year: Sometimes true  Transportation Needs: No Transportation Needs (06/14/2022)   PRAPARE - Administrator, Civil Service (Medical): No    Lack of Transportation (Non-Medical): No  Physical Activity: Insufficiently Active (11/16/2022)   Exercise Vital Sign    Days of Exercise per Week: 3 days     Minutes of Exercise per Session: 20 min  Stress: No Stress Concern Present (11/16/2022)   [employer] of Occupational Health - Occupational Stress Questionnaire    Feeling of Stress : Only a little  Social Connections: Moderately Isolated (11/16/2022)   Social Connection and Isolation Panel [NHANES]    Frequency of Communication with Friends and Family: More than three times a week    Frequency of Social Gatherings with Friends and Family: Never    Attends Religious Services: Never    Database administrator or Organizations: No    Attends Banker Meetings: Never    Marital Status: Married  Catering manager Violence: Not At Risk (11/16/2022)   Humiliation, Afraid, Rape, and Kick  questionnaire    Fear of Current or Ex-Partner: No    Emotionally Abused: No    Physically Abused: No    Sexually Abused: No    Family History  Problem Relation Age of Onset   Osteoporosis Mother    Alcohol abuse Father    Drug abuse Father    Healthy Sister    ADD / ADHD Brother    OCD Brother    Diabetes Maternal Grandmother    Pancreatic cancer Maternal Grandmother 50   Depression Paternal Grandmother    COPD Paternal Grandmother    Alcohol abuse Maternal Aunt    Bipolar disorder Maternal Aunt    Diabetes Maternal Aunt    Breast cancer Maternal Aunt 75       BRCA neg   Diabetes Maternal Uncle    Prostate cancer Maternal Uncle      ROS    Objective  Physical Exam: BP (!) 138/99   Pulse (!) 103   Temp 97.8 F (36.6 C) (Oral)   Resp 18   Ht 5\' 8"  (1.727 m)   Wt 101.2 kg   BMI 33.92 kg/m   OBGyn Exam SVE closed/thick/posterior  FHT 135, mod variability, pos accels, no decels Toco Ucs q3-61min   Hospital Course: The patient was admitted to [facility] Triage for observation. Bag of IV LR was given and Brianna Marshall also ate dinner, she felt much better after this. Initial SVE unchanged two hours later. Additionally after two hours her contractions spaced out and were q4-17min  and no longer painful. She was not always aware of when she was having a contraction. Her next ROB is this Thursday and she likely will also see MFM this week. I do not feel she is in preterm labor currently. Did discuss s/sx of preterm labor and worsening or severe preeclampsia and to return if she has any s/sx of either.  Assessment: 32 y.o. female G2P1001 at [redacted]w[redacted]d  RNST Recent mild preeclampsia dx GDMA1 well controlled No in preterm labor- no cervical change, contractions subsided Likely was dehydrated- resolved with fluids Has follow up with OB and MFM  Plan: Discharge home Follow up this week at your ROB and with MFM Teaching: warning s/sx of PTL and severe or worsening preeclampsia  Discharge Instructions     Discharge activity:  No Restrictions   Complete by: As directed    Discharge diet:  No restrictions   Complete by: As directed    Discharge instructions   Complete by: As directed    Keep ROB for Thursday Watch for s/sx of worsening preeclampsia and/or preterm labor as discussed and return if you experience these   No sexual activity restrictions   Complete by: As directed    Notify physician for a general feeling that "something is not right"   Complete by: As directed    Notify physician for increase or change in vaginal discharge   Complete by: As directed    Notify physician for intestinal cramps, with or without diarrhea, sometimes described as "gas pain"   Complete by: As directed    Notify physician for leaking of fluid   Complete by: As directed    Notify physician for low, dull backache, unrelieved by heat or Tylenol   Complete by: As directed    Notify physician for menstrual like cramps   Complete by: As directed    Notify physician for pelvic pressure   Complete by: As directed    Notify physician for uterine contractions.  These  may be painless and feel like the uterus is tightening or the baby is  "balling up"   Complete by: As directed    Notify  physician for vaginal bleeding   Complete by: As directed    PRETERM LABOR:  Includes any of the follwing symptoms that occur between 20 - [redacted] weeks gestation.  If these symptoms are not stopped, preterm labor can result in preterm delivery, placing your baby at risk   Complete by: As directed       Allergies as of 04/28/2023       Reactions   Shellfish Allergy Nausea And Vomiting, Nausea Only   Latex Dermatitis   Amoxicillin Other (See Comments), Rash   Lidocaine Rash   topical        Medication List     TAKE these medications    albuterol 108 (90 Base) MCG/ACT inhaler Commonly known as: VENTOLIN HFA Inhale 2 puffs into the lungs every 6 (six) hours as needed for wheezing or shortness of breath.   aspirin EC 81 MG tablet Take 1 tablet (81 mg total) by mouth daily. Swallow whole.   doxylamine (Sleep) 25 MG tablet Commonly known as: UNISOM Take 25 mg by mouth at bedtime as needed (nauea).   folic acid 400 MCG tablet Commonly known as: FOLVITE Take 400 mcg by mouth daily.   FreeStyle Libre 3 Sensor Misc 1 patch by Does not apply route every 14 (fourteen) days. Place 1 sensor on the skin every 14 days. Use to check glucose continuously   metFORMIN 1000 MG tablet Commonly known as: GLUCOPHAGE Take 1,000 mg by mouth 2 (two) times daily.   montelukast 10 MG tablet Commonly known as: SINGULAIR Take 1 tablet (10 mg total) by mouth at bedtime.   omeprazole 20 MG tablet Commonly known as: PriLOSEC OTC Take 1 tablet (20 mg total) by mouth daily.   ondansetron 4 MG disintegrating tablet Commonly known as: ZOFRAN-ODT Take 1 tablet (4 mg total) by mouth every 6 (six) hours as needed for nausea.   pyridOXINE 50 MG tablet Commonly known as: VITAMIN B6 Take 50 mg by mouth daily.   sertraline 100 MG tablet Commonly known as: ZOLOFT Take 1.5 tablets (150 mg total) by mouth daily.   TYLENOL PO Take by mouth as needed.         Total time spent taking care of this  patient: 45 minutes  Signed: Raeford Razor CNM, FNP 04/28/2023, 10:30 PM

## 2023-04-28 NOTE — Progress Notes (Signed)
Patient given discharge instructions. CNM states that she went over instructions with patient verbally before discharge. RN went over instructions and patient states that she understands. Discharged in good condition home,  ambulatory with significant other.

## 2023-05-03 ENCOUNTER — Telehealth: Payer: Self-pay

## 2023-05-03 ENCOUNTER — Encounter: Payer: Self-pay | Admitting: Obstetrics

## 2023-05-03 NOTE — Telephone Encounter (Signed)
Left voicemail to return call. 

## 2023-05-03 NOTE — Telephone Encounter (Signed)
Patient advised per Dr. Valentino Saxon and Dr. Logan Bores, due to preeclampsia/polyhydramnios, they plan to deliver her at 37 weeks. They will manage her care accordingly. MFM appointment not needed. This will be discussed/scheduled at her visit on Friday 05/05/23.

## 2023-05-05 ENCOUNTER — Observation Stay
Admission: EM | Admit: 2023-05-05 | Discharge: 2023-05-05 | Disposition: A | Discharge status: Home / Self Care | Payer: BC Managed Care – PPO | Attending: Family | Admitting: Family

## 2023-05-05 ENCOUNTER — Other Ambulatory Visit (HOSPITAL_COMMUNITY)
Admission: RE | Admit: 2023-05-05 | Discharge: 2023-05-05 | Disposition: A | Discharge status: Home / Self Care | Payer: BC Managed Care – PPO | Source: Ambulatory Visit | Attending: Obstetrics | Admitting: Obstetrics

## 2023-05-05 ENCOUNTER — Encounter: Payer: Self-pay | Admitting: Obstetrics and Gynecology

## 2023-05-05 ENCOUNTER — Ambulatory Visit (INDEPENDENT_AMBULATORY_CARE_PROVIDER_SITE_OTHER): Payer: BC Managed Care – PPO | Admitting: Obstetrics

## 2023-05-05 ENCOUNTER — Other Ambulatory Visit: Payer: Self-pay

## 2023-05-05 VITALS — BP 140/96 | HR 121 | Wt 225.2 lb

## 2023-05-05 DIAGNOSIS — O1493 Unspecified pre-eclampsia, third trimester: Secondary | ICD-10-CM

## 2023-05-05 DIAGNOSIS — O24419 Gestational diabetes mellitus in pregnancy, unspecified control: Secondary | ICD-10-CM | POA: Insufficient documentation

## 2023-05-05 DIAGNOSIS — F32A Depression, unspecified: Secondary | ICD-10-CM

## 2023-05-05 DIAGNOSIS — Z91040 Latex allergy status: Secondary | ICD-10-CM | POA: Diagnosis not present

## 2023-05-05 DIAGNOSIS — Z8616 Personal history of COVID-19: Secondary | ICD-10-CM | POA: Insufficient documentation

## 2023-05-05 DIAGNOSIS — O09293 Supervision of pregnancy with other poor reproductive or obstetric history, third trimester: Secondary | ICD-10-CM

## 2023-05-05 DIAGNOSIS — O99513 Diseases of the respiratory system complicating pregnancy, third trimester: Secondary | ICD-10-CM | POA: Diagnosis not present

## 2023-05-05 DIAGNOSIS — O99213 Obesity complicating pregnancy, third trimester: Secondary | ICD-10-CM

## 2023-05-05 DIAGNOSIS — Z113 Encounter for screening for infections with a predominantly sexual mode of transmission: Secondary | ICD-10-CM | POA: Insufficient documentation

## 2023-05-05 DIAGNOSIS — E669 Obesity, unspecified: Secondary | ICD-10-CM | POA: Diagnosis not present

## 2023-05-05 DIAGNOSIS — J45909 Unspecified asthma, uncomplicated: Secondary | ICD-10-CM | POA: Insufficient documentation

## 2023-05-05 DIAGNOSIS — Z8507 Personal history of malignant neoplasm of pancreas: Secondary | ICD-10-CM | POA: Insufficient documentation

## 2023-05-05 DIAGNOSIS — Z7984 Long term (current) use of oral hypoglycemic drugs: Secondary | ICD-10-CM | POA: Diagnosis not present

## 2023-05-05 DIAGNOSIS — G40909 Epilepsy, unspecified, not intractable, without status epilepticus: Secondary | ICD-10-CM

## 2023-05-05 DIAGNOSIS — O99353 Diseases of the nervous system complicating pregnancy, third trimester: Secondary | ICD-10-CM

## 2023-05-05 DIAGNOSIS — Z7982 Long term (current) use of aspirin: Secondary | ICD-10-CM | POA: Insufficient documentation

## 2023-05-05 DIAGNOSIS — O0993 Supervision of high risk pregnancy, unspecified, third trimester: Secondary | ICD-10-CM

## 2023-05-05 DIAGNOSIS — O133 Gestational [pregnancy-induced] hypertension without significant proteinuria, third trimester: Secondary | ICD-10-CM | POA: Diagnosis present

## 2023-05-05 DIAGNOSIS — Z3A35 35 weeks gestation of pregnancy: Secondary | ICD-10-CM | POA: Diagnosis not present

## 2023-05-05 DIAGNOSIS — O403XX0 Polyhydramnios, third trimester, not applicable or unspecified: Secondary | ICD-10-CM

## 2023-05-05 DIAGNOSIS — F419 Anxiety disorder, unspecified: Secondary | ICD-10-CM

## 2023-05-05 DIAGNOSIS — O24415 Gestational diabetes mellitus in pregnancy, controlled by oral hypoglycemic drugs: Secondary | ICD-10-CM

## 2023-05-05 DIAGNOSIS — O163 Unspecified maternal hypertension, third trimester: Secondary | ICD-10-CM

## 2023-05-05 DIAGNOSIS — O99343 Other mental disorders complicating pregnancy, third trimester: Secondary | ICD-10-CM

## 2023-05-05 DIAGNOSIS — O1403 Mild to moderate pre-eclampsia, third trimester: Principal | ICD-10-CM | POA: Insufficient documentation

## 2023-05-05 DIAGNOSIS — O24410 Gestational diabetes mellitus in pregnancy, diet controlled: Secondary | ICD-10-CM

## 2023-05-05 DIAGNOSIS — Z3685 Encounter for antenatal screening for Streptococcus B: Secondary | ICD-10-CM

## 2023-05-05 HISTORY — DX: Unspecified maternal hypertension, third trimester: O16.3

## 2023-05-05 LAB — COMPREHENSIVE METABOLIC PANEL
ALT: 12 U/L (ref 0–44)
AST: 21 U/L (ref 15–41)
Albumin: 2.3 g/dL — ABNORMAL LOW (ref 3.5–5.0)
Alkaline Phosphatase: 125 U/L (ref 38–126)
Anion gap: 12 (ref 5–15)
BUN: 10 mg/dL (ref 6–20)
CO2: 15 mmol/L — ABNORMAL LOW (ref 22–32)
Calcium: 8.2 mg/dL — ABNORMAL LOW (ref 8.9–10.3)
Chloride: 105 mmol/L (ref 98–111)
Creatinine, Ser: 0.65 mg/dL (ref 0.44–1.00)
GFR, Estimated: >60 mL/min (ref >60)
Glucose, Bld: 153 mg/dL — ABNORMAL HIGH (ref 70–99)
Potassium: 3.7 mmol/L (ref 3.5–5.1)
Sodium: 132 mmol/L — ABNORMAL LOW (ref 135–145)
Total Bilirubin: 0.3 mg/dL (ref 0.3–1.2)
Total Protein: 5.8 g/dL — ABNORMAL LOW (ref 6.5–8.1)

## 2023-05-05 LAB — CBC WITH DIFFERENTIAL/PLATELET
Abs Immature Granulocytes: 0.04 10*3/uL (ref 0.00–0.07)
Basophils Absolute: 0 10*3/uL (ref 0.0–0.1)
Basophils Relative: 0 %
Eosinophils Absolute: 0 10*3/uL (ref 0.0–0.5)
Eosinophils Relative: 0 %
HCT: 29.8 % — ABNORMAL LOW (ref 36.0–46.0)
Hemoglobin: 9.7 g/dL — ABNORMAL LOW (ref 12.0–15.0)
Immature Granulocytes: 0 %
Lymphocytes Relative: 18 %
Lymphs Abs: 2 10*3/uL (ref 0.7–4.0)
MCH: 26.1 pg (ref 26.0–34.0)
MCHC: 32.6 g/dL (ref 30.0–36.0)
MCV: 80.3 fL (ref 80.0–100.0)
Monocytes Absolute: 0.4 10*3/uL (ref 0.1–1.0)
Monocytes Relative: 3 %
Neutro Abs: 8.6 10*3/uL — ABNORMAL HIGH (ref 1.7–7.7)
Neutrophils Relative %: 79 %
Platelets: 281 10*3/uL (ref 150–400)
RBC: 3.71 MIL/uL — ABNORMAL LOW (ref 3.87–5.11)
RDW: 13.7 % (ref 11.5–15.5)
WBC: 11 10*3/uL — ABNORMAL HIGH (ref 4.0–10.5)
nRBC: 0 % (ref 0.0–0.2)

## 2023-05-05 LAB — POCT URINALYSIS DIPSTICK OB
Bilirubin, UA: NEGATIVE
Blood, UA: NEGATIVE
Glucose, UA: NEGATIVE
Ketones, UA: NEGATIVE
Nitrite, UA: NEGATIVE
Spec Grav, UA: 1.015 (ref 1.010–1.025)
Urobilinogen, UA: 0.2 E.U./dL
pH, UA: 6.5 (ref 5.0–8.0)

## 2023-05-05 LAB — PROTEIN / CREATININE RATIO, URINE
Creatinine, Urine: 251 mg/dL
Protein Creatinine Ratio: 0.45 mg/mg{Cre} — ABNORMAL HIGH (ref 0.00–0.15)
Total Protein, Urine: 113 mg/dL

## 2023-05-05 MED ORDER — LABETALOL HCL 200 MG PO TABS: 200.0000 mg | ORAL_TABLET | Freq: Two times a day (BID) | ORAL | 0 refills | Status: DC

## 2023-05-05 MED ORDER — LABETALOL HCL 100 MG PO TABS
200.0000 mg | ORAL_TABLET | Freq: Once | ORAL | Status: AC
  Administered 2023-05-05: 200 mg via ORAL
  Filled 2023-05-05: qty 2

## 2023-05-05 NOTE — Progress Notes (Signed)
Routine Prenatal Care Visit  Subjective  Brianna Marshall is a 32 y.o. G2P1001 at [redacted]w[redacted]d being seen today for ongoing prenatal care.  She is currently monitored for the following issues for this high-risk pregnancy and has PCOS (polycystic ovarian syndrome); Prediabetes; Vitamin D deficiency; Insulin resistance; Major depression in partial remission (HCC); Gestational diabetes mellitus (GDM) controlled on oral hypoglycemic drug; Preeclampsia; Anxiety; Migraine without aura and without status migrainosus, not intractable; Sleep disorder; Alopecia; Fatigue; Supervision of high-risk pregnancy; Obesity in pregnancy; History of diet controlled gestational diabetes mellitus (GDM); Nausea and vomiting in pregnancy; Labor and delivery, indication for care; [redacted] weeks gestation of pregnancy; Polyhydramnios affecting pregnancy in third trimester; and Uterine contractions on their problem list.  ----------------------------------------------------------------------------------- Patient reports no complaints.  Her blood pressures are elevated. She denies headache today. Reports her baby is moving.she ahs already been diagnosed with mild pre eclampsia and has an IOL set for 5/28 at 0500 Contractions: Not present. Vag. Bleeding: None.  Movement: Present. Leaking Fluid denies.  ----------------------------------------------------------------------------------- The following portions of the patient's history were reviewed and updated as appropriate: allergies, current medications, past family history, past medical history, past social history, past surgical history and problem list. Problem list updated.  Objective  Blood pressure (!) 140/96, pulse (!) 121, weight 225 lb 3.2 oz (102.2 kg). Pregravid weight 230 lb (104.3 kg) Total Weight Gain -4 lb 12.8 oz (-2.177 kg) Urinalysis: Urine Protein Small (1+)  Urine Glucose Negative  Fetal Status:     Movement: Present     General:  Alert, oriented and  cooperative. Patient is in no acute distress.  Skin: Skin is warm and dry. No rash noted.   Cardiovascular: Normal heart rate noted  Respiratory: Normal respiratory effort, no problems with respiration noted  Abdomen: Soft, gravid, appropriate for gestational age. Pain/Pressure: Present     Pelvic:  Cervical exam deferred        Extremities: Normal range of motion.  Edema: Trace  Mental Status: Normal mood and affect. Normal behavior. Normal judgment and thought content.   Assessment   32 y.o. G2P1001 at [redacted]w[redacted]d by  06/03/2023, by Ultrasound presenting for routine prenatal visit High BMI, Pre eclampsia, GDM Severe range pressures in the office today  Plan   SECOND Problems (from 11/16/22 to present)     Problem Noted Resolved   Supervision of high-risk pregnancy 11/16/2022 by Donnetta Hail, CMA No   Overview Addendum 05/03/2023  2:17 PM by Mirna Mires, CNM     Clinical Staff Provider  Office Location  [facility] Dating  Normal female  Language  English Anatomy US  Noirmal female  Flu Vaccine  10/07/22 Genetic Screen  NIPS:   TDaP vaccine   Offer Hgb A1C or  GTT Early : Third trimester : 169  Covid    LAB RESULTS   Rhogam  O/Positive/-- (12/01 1022) N/A Blood Type O/Positive/-- (12/01 1022)   Feeding Plan Formula Antibody Negative (12/01 1022)  Contraception OCP's Rubella 3.40 (12/01 1022)  Circumcision No RPR Non Reactive (03/11 1134)   Pediatrician  [facility] HBsAg Negative (12/01 1022)   Support Person Denyse Amass HIV Non Reactive (03/11 1134)  Prenatal Classes No Varicella immune    GBS  (For PCN allergy, check sensitivities)   BTL Consent  Hep C Non Reactive (12/01 1022)   VBAC Consent  Pap Diagnosis  Date Value Ref Range Status  12/23/2022   Final   - Negative for Intraepithelial Lesions or Malignancy (NILM)  12/23/2022 -  Benign reactive/reparative changes  Final      Hgb Electro      CF      SMA                    Preterm labor symptoms and  general obstetric precautions including but not limited to vaginal bleeding, contractions, leaking of fluid and fetal movement were reviewed in detail with the patient. Please refer to After Visit Summary for other counseling recommendations.  Sending her over to the unit for EFM, serial Bps and lab work. Raeford Razor CNM is notified. Zerelda is aware of the Iol date which is now set for 5/28 at 0500   No follow-ups on file.  Mirna Mires, CNM  05/05/2023 4:07 PM

## 2023-05-05 NOTE — Progress Notes (Signed)
Discharge instructions provided to patient. Patient verbalized understanding. Pt educated on signs and symptoms of labor, vaginal bleeding, LOF, and fetal movement. Red flag signs reviewed by RN. Patient discharged home with significant other in stable condition.  

## 2023-05-05 NOTE — OB Triage Note (Signed)
Patient is a 32 yo, G2P1, at 35 weeks 6 days. Patient presents to labor and delivery after being sent over from AOB for elevated BPs. Patient denies any headache, changes in vision, or sudden swelling. Patient reports occasional upper epigastric pain.  Patient denies any vaginal bleeding or LOF. Patient reports occasional, regular BH ctx. Patient endorses +FM. Monitors applied and assessing. VSS with elevated BPs. Initial fetal heart tone 145. Tonita Cong, CNM in department and notified of patients arrival to unit. Plan to place in observation for PIH workup, fetal monitoring, and serial BPs.

## 2023-05-05 NOTE — Final Progress Note (Signed)
OB/Triage Note  Patient ID: Brianna Marshall MRN: [id number] DOB/AGE: 18-Jul-1991 32 y.o.  Subjective  History of Present Illness: The patient is a 32 y.o. female G2P1001 at [redacted]w[redacted]d who presents for serial blood pressure.  Was seen in clinic today and had an initial BP of 137/102, repeat was 140/96.   She has a hx of preeclampsia with her prior pregnancy. Was dx with mild preeclampsia based on a PC ratio of 0.36 on 04/24/23. Has scheduled induction planned for 5/28 at 0500. Currently denies HA, visual changes, painful contractions, VB, LOF. Endorses good FM. Has had achy pain to RUQ, although this has been present for weeks, had normal LFTs previously and told pain was likely from baby kicking her.  Pregnancy has been complicated by dx of preeclampsia, PCOS, obesity with BMI of 34, GDM diet controlled, depression, anxiety, migraines without aura, sleep disorder, fatigue and polyhydramnios.    Past Medical History:  Diagnosis Date   Anemia in pregnancy 05/30/2018   Antepartum mild preeclampsia, third trimester 07/30/2018   Asthma    COVID-19 02/2020   Depression    Family history of pancreatic cancer    Gestational diabetes 06/11/2018   Gestational diabetes mellitus (GDM) affecting third pregnancy 07/29/2018   Headache, migraine 07/14/2015   approx 2x/month   Hyperemesis 06/05/2018   Infertility, anovulation 03/10/2017   Nausea/vomiting in pregnancy 01/17/2018   Obesity    PCOS (polycystic ovarian syndrome) 07/08/2016   Prediabetes 07/08/2016   Hgb 5.8  04/2015    Supervision of high risk pregnancy, antepartum 01/02/2018   Clinic [facility] Prenatal Labs Dating Early [place] Blood type: O/Positive/-- (01/29 1038)  Genetic Screen 1 Screen: Neg Antibody:Negative (01/29 1038) Anatomic [place] Normal, anterior placenta,  Rubella: <0.90 (01/29 1038) Varicella: Imm GTT Early: 123 Third trimester:  RPR: Non Reactive (01/29 1038)  Rhogam n/a HBsAg: Negative (01/29 1038)  TDaP vaccine         05/30/2018    Flu Shot: 09/2017 HIV: Non Reac   Vertigo    for short time after covid   Vitamin D deficiency 07/08/2016   18.7 in 04/2015     Past Surgical History:  Procedure Laterality Date   ENDOSCOPIC CONCHA BULLOSA RESECTION Right 12/30/2021   Procedure: RIGHT CONCHA BULLOSA REDUCTION VIA NASAL ENDOSCOPY;  Surgeon: Vernie Murders, MD;  Location: [facility];  Service: ENT;  Laterality: Right;   INCISE AND DRAIN ABCESS Right 2011   Posterior Thigh   sebaceous cyst removal  07/2017   Back of neck   SEPTOPLASTY N/A 12/30/2021   Procedure: SEPTOPLASTY;  Surgeon: Vernie Murders, MD;  Location: [facility];  Service: ENT;  Laterality: N/A;   TURBINATE REDUCTION Right 12/30/2021   Procedure: PARTIAL EXCISION RIGHT INFERIOR TURBINATE;  Surgeon: Vernie Murders, MD;  Location: [facility];  Service: ENT;  Laterality: Right;   WISDOM TOOTH EXTRACTION      No current facility-administered medications on file prior to encounter.   Current Outpatient Medications on File Prior to Encounter  Medication Sig Dispense Refill   Acetaminophen (TYLENOL PO) Take by mouth as needed.     aspirin EC 81 MG tablet Take 1 tablet (81 mg total) by mouth daily. Swallow whole. 30 tablet 12   Continuous Blood Gluc Sensor (FREESTYLE LIBRE 3 SENSOR) MISC 1 patch by Does not apply route every 14 (fourteen) days. Place 1 sensor on the skin every 14 days. Use to check glucose continuously 2 each 8   doxylamine, Sleep, (UNISOM) 25  MG tablet Take 25 mg by mouth at bedtime as needed (nauea).     folic acid (FOLVITE) 400 MCG tablet Take 400 mcg by mouth daily.     montelukast (SINGULAIR) 10 MG tablet Take 1 tablet (10 mg total) by mouth at bedtime. 90 tablet 1   omeprazole (PRILOSEC OTC) 20 MG tablet Take 1 tablet (20 mg total) by mouth daily. 30 tablet 6   omeprazole (PRILOSEC) 20 MG capsule Take 20 mg by mouth daily.     ondansetron (ZOFRAN-ODT) 4 MG disintegrating tablet Take 1 tablet (4 mg total) by  mouth every 6 (six) hours as needed for nausea. 20 tablet 2   pyridOXINE (VITAMIN B6) 50 MG tablet Take 50 mg by mouth daily.     sertraline (ZOLOFT) 100 MG tablet Take 1.5 tablets (150 mg total) by mouth daily. 45 tablet 2   albuterol (VENTOLIN HFA) 108 (90 Base) MCG/ACT inhaler Inhale 2 puffs into the lungs every 6 (six) hours as needed for wheezing or shortness of breath. 6.7 g 11   metFORMIN (GLUCOPHAGE) 1000 MG tablet Take 1,000 mg by mouth 2 (two) times daily.      Allergies  Allergen Reactions   Shellfish Allergy Nausea And Vomiting and Nausea Only   Latex Dermatitis   Amoxicillin Other (See Comments) and Rash   Lidocaine Rash    topical    Social History   Socioeconomic History   Marital status: Married    Spouse name: Denyse Amass   Number of children: 1   Years of education: 12   Highest education level: High school graduate  Occupational History   Occupation: Printmaker  Tobacco Use   Smoking status: Never   Smokeless tobacco: Never  Vaping Use   Vaping Use: Never used  Substance and Sexual Activity   Alcohol use: Not Currently    Alcohol/week: 0.0 standard drinks of alcohol    Comment: rarely   Drug use: No   Sexual activity: Yes    Partners: Male    Birth control/protection: None  Other Topics Concern   Not on file  Social History Narrative   Not on file   Social Determinants of Health   Financial Resource Strain: Medium Risk (11/16/2022)   Overall Financial Resource Strain (CARDIA)    Difficulty of Paying Living Expenses: Somewhat hard  Food Insecurity: Food Insecurity Present (11/16/2022)   Hunger Vital Sign    Worried About Running Out of Food in the Last Year: Sometimes true    Ran Out of Food in the Last Year: Sometimes true  Transportation Needs: No Transportation Needs (06/14/2022)   PRAPARE - Administrator, Civil Service (Medical): No    Lack of Transportation (Non-Medical): No  Physical Activity: Insufficiently Active (11/16/2022)    Exercise Vital Sign    Days of Exercise per Week: 3 days    Minutes of Exercise per Session: 20 min  Stress: No Stress Concern Present (11/16/2022)   [employer] of Occupational Health - Occupational Stress Questionnaire    Feeling of Stress : Only a little  Social Connections: Moderately Isolated (11/16/2022)   Social Connection and Isolation Panel [NHANES]    Frequency of Communication with Friends and Family: More than three times a week    Frequency of Social Gatherings with Friends and Family: Never    Attends Religious Services: Never    Database administrator or Organizations: No    Attends Banker Meetings: Never    Marital Status:  Married  Intimate Partner Violence: Not At Risk (11/16/2022)   Humiliation, Afraid, Rape, and Kick questionnaire    Fear of Current or Ex-Partner: No    Emotionally Abused: No    Physically Abused: No    Sexually Abused: No    Family History  Problem Relation Age of Onset   Osteoporosis Mother    Alcohol abuse Father    Drug abuse Father    Healthy Sister    ADD / ADHD Brother    OCD Brother    Diabetes Maternal Grandmother    Pancreatic cancer Maternal Grandmother 68   Depression Paternal Grandmother    COPD Paternal Grandmother    Alcohol abuse Maternal Aunt    Bipolar disorder Maternal Aunt    Diabetes Maternal Aunt    Breast cancer Maternal Aunt 77       BRCA neg   Diabetes Maternal Uncle    Prostate cancer Maternal Uncle      ROS    Objective  Physical Exam: BP (!) 142/89   Pulse 93   Temp 98.3 F (36.8 C) (Oral)   Resp 16   Ht 5\' 8"  (1.727 m)   Wt 102.2 kg   BMI 34.24 kg/m   Repeat Bps: 148/96, 151/91, 133/92, 142/89  OBGyn Exam  FHT 145, mod variability, pos accels, no decels Toco rare contractions  Significant Findings/ Diagnostic Studies: PC ratio 0.45, LFTs WNL, creatinine 0.65, hgb 9.7, platelets 281   Hospital Course: The patient was admitted to [facility] for observation. PIH labs  were redrawn and serial blood pressures done. Had a RNST. No severe range Bps in clinic. PC ratio only slightly increased from previous value. No s/sx of severe preeclampsia. Consulted with Dr. Valentino Saxon who feels patient can be discharged home, started on oral labetalol 200mg  BID, call clinic Monday morning to return for BP check. Patient is in agreement with plan  Assessment: 32 y.o. female G2P1001 at [redacted]w[redacted]d  Mild preeclampsia  Plan: Discharge home Follow up on Monday, call clinic for BP check either Monday or Tuesday Teaching: return to [facility] triage if you have severe headache, visual changes or worsened RUQ pain Start labetalol 200mg  twice daily  Discharge Instructions     Discharge activity:  No Restrictions   Complete by: As directed    Discharge diet:  No restrictions   Complete by: As directed    Discharge instructions   Complete by: As directed    Call clinic Monday morning to schedule a BP check early in the week (either Monday or Tuesday) Return to [facility] triage if you develop severe headache, visual changes or RUQ pain Can use home BP cuff, come to triage if blood pressure is 160/110 or higher   Notify physician for a general feeling that "something is not right"   Complete by: As directed    Notify physician for increase or change in vaginal discharge   Complete by: As directed    Notify physician for intestinal cramps, with or without diarrhea, sometimes described as "gas pain"   Complete by: As directed    Notify physician for leaking of fluid   Complete by: As directed    Notify physician for low, dull backache, unrelieved by heat or Tylenol   Complete by: As directed    Notify physician for menstrual like cramps   Complete by: As directed    Notify physician for pelvic pressure   Complete by: As directed    Notify physician for uterine contractions.  These may be painless and feel like the uterus is tightening or the baby is  "balling up"   Complete by: As directed     Notify physician for vaginal bleeding   Complete by: As directed    PRETERM LABOR:  Includes any of the follwing symptoms that occur between 20 - [redacted] weeks gestation.  If these symptoms are not stopped, preterm labor can result in preterm delivery, placing your baby at risk   Complete by: As directed       Allergies as of 05/05/2023       Reactions   Shellfish Allergy Nausea And Vomiting, Nausea Only   Latex Dermatitis   Amoxicillin Other (See Comments), Rash   Lidocaine Rash   topical        Medication List     TAKE these medications    albuterol 108 (90 Base) MCG/ACT inhaler Commonly known as: VENTOLIN HFA Inhale 2 puffs into the lungs every 6 (six) hours as needed for wheezing or shortness of breath.   aspirin EC 81 MG tablet Take 1 tablet (81 mg total) by mouth daily. Swallow whole.   doxylamine (Sleep) 25 MG tablet Commonly known as: UNISOM Take 25 mg by mouth at bedtime as needed (nauea).   folic acid 400 MCG tablet Commonly known as: FOLVITE Take 400 mcg by mouth daily.   FreeStyle Libre 3 Sensor Misc 1 patch by Does not apply route every 14 (fourteen) days. Place 1 sensor on the skin every 14 days. Use to check glucose continuously   labetalol 200 MG tablet Commonly known as: NORMODYNE Take 1 tablet (200 mg total) by mouth 2 (two) times daily.   metFORMIN 1000 MG tablet Commonly known as: GLUCOPHAGE Take 1,000 mg by mouth 2 (two) times daily.   montelukast 10 MG tablet Commonly known as: SINGULAIR Take 1 tablet (10 mg total) by mouth at bedtime.   omeprazole 20 MG capsule Commonly known as: PRILOSEC Take 20 mg by mouth daily.   omeprazole 20 MG tablet Commonly known as: PriLOSEC OTC Take 1 tablet (20 mg total) by mouth daily.   ondansetron 4 MG disintegrating tablet Commonly known as: ZOFRAN-ODT Take 1 tablet (4 mg total) by mouth every 6 (six) hours as needed for nausea.   pyridOXINE 50 MG tablet Commonly known as: VITAMIN B6 Take 50 mg by  mouth daily.   sertraline 100 MG tablet Commonly known as: ZOLOFT Take 1.5 tablets (150 mg total) by mouth daily.   TYLENOL PO Take by mouth as needed.         Total time spent taking care of this patient: 75 minutes  Signed: Raeford Razor CNM, FNP 05/05/2023

## 2023-05-07 LAB — STREP GP B NAA: Strep Gp B NAA: NEGATIVE

## 2023-05-08 ENCOUNTER — Observation Stay
Admission: EM | Admit: 2023-05-08 | Discharge: 2023-05-09 | Disposition: A | Discharge status: Home / Self Care | Payer: BC Managed Care – PPO | Attending: Certified Nurse Midwife | Admitting: Certified Nurse Midwife

## 2023-05-08 ENCOUNTER — Encounter: Payer: Self-pay | Admitting: Obstetrics and Gynecology

## 2023-05-08 ENCOUNTER — Other Ambulatory Visit: Payer: Self-pay

## 2023-05-08 ENCOUNTER — Ambulatory Visit (INDEPENDENT_AMBULATORY_CARE_PROVIDER_SITE_OTHER): Payer: BC Managed Care – PPO

## 2023-05-08 VITALS — BP 120/79 | HR 104 | Resp 16 | Ht 68.0 in | Wt 219.7 lb

## 2023-05-08 DIAGNOSIS — O368130 Decreased fetal movements, third trimester, not applicable or unspecified: Secondary | ICD-10-CM | POA: Diagnosis not present

## 2023-05-08 DIAGNOSIS — Z3A36 36 weeks gestation of pregnancy: Secondary | ICD-10-CM | POA: Insufficient documentation

## 2023-05-08 DIAGNOSIS — O0993 Supervision of high risk pregnancy, unspecified, third trimester: Principal | ICD-10-CM

## 2023-05-08 DIAGNOSIS — O2653 Maternal hypotension syndrome, third trimester: Principal | ICD-10-CM | POA: Insufficient documentation

## 2023-05-08 DIAGNOSIS — Z0130 Encounter for examination of blood pressure without abnormal findings: Secondary | ICD-10-CM

## 2023-05-08 LAB — COMPREHENSIVE METABOLIC PANEL
ALT: 13 U/L (ref 0–44)
AST: 19 U/L (ref 15–41)
Albumin: 2.2 g/dL — ABNORMAL LOW (ref 3.5–5.0)
Alkaline Phosphatase: 124 U/L (ref 38–126)
Anion gap: 8 (ref 5–15)
BUN: 11 mg/dL (ref 6–20)
CO2: 20 mmol/L — ABNORMAL LOW (ref 22–32)
Calcium: 8.4 mg/dL — ABNORMAL LOW (ref 8.9–10.3)
Chloride: 103 mmol/L (ref 98–111)
Creatinine, Ser: 0.71 mg/dL (ref 0.44–1.00)
GFR, Estimated: >60 mL/min (ref >60)
Glucose, Bld: 118 mg/dL — ABNORMAL HIGH (ref 70–99)
Potassium: 3.8 mmol/L (ref 3.5–5.1)
Sodium: 131 mmol/L — ABNORMAL LOW (ref 135–145)
Total Bilirubin: 0.4 mg/dL (ref 0.3–1.2)
Total Protein: 5.4 g/dL — ABNORMAL LOW (ref 6.5–8.1)

## 2023-05-08 LAB — CBC
HCT: 28.6 % — ABNORMAL LOW (ref 36.0–46.0)
Hemoglobin: 9.4 g/dL — ABNORMAL LOW (ref 12.0–15.0)
MCH: 26.6 pg (ref 26.0–34.0)
MCHC: 32.9 g/dL (ref 30.0–36.0)
MCV: 80.8 fL (ref 80.0–100.0)
Platelets: 287 10*3/uL (ref 150–400)
RBC: 3.54 MIL/uL — ABNORMAL LOW (ref 3.87–5.11)
RDW: 14.4 % (ref 11.5–15.5)
WBC: 9.3 10*3/uL (ref 4.0–10.5)
nRBC: 0 % (ref 0.0–0.2)

## 2023-05-08 LAB — PROTEIN / CREATININE RATIO, URINE
Creatinine, Urine: 72 mg/dL
Protein Creatinine Ratio: 0.28 mg/mg{Cre} — ABNORMAL HIGH (ref 0.00–0.15)
Total Protein, Urine: 20 mg/dL

## 2023-05-08 LAB — GLUCOSE, CAPILLARY: Glucose-Capillary: 110 mg/dL — ABNORMAL HIGH (ref 70–99)

## 2023-05-08 MED ORDER — LACTATED RINGERS IV BOLUS
1000.0000 mL | Freq: Once | INTRAVENOUS | Status: AC
  Administered 2023-05-08: 1000 mL via INTRAVENOUS

## 2023-05-08 MED ORDER — DIPHENHYDRAMINE HCL 50 MG/ML IJ SOLN: 25.0000 mg | Freq: Once | INTRAMUSCULAR | Status: DC | PRN

## 2023-05-08 MED ORDER — SODIUM CHLORIDE 0.9 % IV SOLN: INTRAVENOUS | Status: DC | PRN

## 2023-05-08 MED ORDER — ALBUTEROL SULFATE (2.5 MG/3ML) 0.083% IN NEBU: 2.5000 mg | INHALATION_SOLUTION | Freq: Once | RESPIRATORY_TRACT | Status: DC | PRN

## 2023-05-08 MED ORDER — IRON SUCROSE 500 MG IVPB - SIMPLE MED: 500.0000 mg | Freq: Once | INTRAVENOUS | Status: DC

## 2023-05-08 MED ORDER — METHYLPREDNISOLONE SODIUM SUCC 125 MG IJ SOLR: 125.0000 mg | Freq: Once | INTRAMUSCULAR | Status: DC | PRN

## 2023-05-08 MED ORDER — EPINEPHRINE PF 1 MG/ML IJ SOLN: 0.3000 mg | Freq: Once | INTRAMUSCULAR | Status: DC | PRN

## 2023-05-08 MED ORDER — LACTATED RINGERS IV SOLN: 500.0000 mL | INTRAVENOUS | Status: DC | PRN

## 2023-05-08 MED ORDER — LABETALOL HCL 100 MG PO TABS
100.0000 mg | ORAL_TABLET | Freq: Once | ORAL | Status: AC
  Administered 2023-05-08: 100 mg via ORAL
  Filled 2023-05-08: qty 1

## 2023-05-08 MED ORDER — SODIUM CHLORIDE 0.9 % IV BOLUS: 500.0000 mL | Freq: Once | INTRAVENOUS | Status: DC | PRN

## 2023-05-08 MED ORDER — LACTATED RINGERS IV SOLN: INTRAVENOUS | Status: DC

## 2023-05-08 MED ORDER — ACETAMINOPHEN 500 MG PO TABS
1000.0000 mg | ORAL_TABLET | Freq: Four times a day (QID) | ORAL | Status: DC | PRN
  Administered 2023-05-08: 1000 mg via ORAL
  Filled 2023-05-08: qty 2

## 2023-05-08 MED ORDER — SODIUM CHLORIDE 0.9 % IV SOLN
500.0000 mg | Freq: Once | INTRAVENOUS | Status: AC
  Administered 2023-05-08: 500 mg via INTRAVENOUS
  Filled 2023-05-08: qty 25

## 2023-05-08 NOTE — OB Triage Note (Addendum)
L&D OB Triage Note  SUBJECTIVE Brianna Marshall is a 32 y.o. G2P1001 female at [redacted]w[redacted]d, EDD Estimated Date of Delivery: 06/03/23 who presented to triage from the office due ty hypotension. Pt was started on 200 mg labetalol bid and was in the office earlier for BP check . At that time pt had hypotensive episodes and was sent to l&D for IV hydration and monitoring.   OB History  Gravida Para Term Preterm AB Living  2 1 1  0 0 1  SAB IAB Ectopic Multiple Live Births  0 0 0 0 1    # Outcome Date GA Lbr Len/2nd Weight Sex Delivery Anes PTL Lv  2 Current           1 Term 07/30/18 [redacted]w[redacted]d / 01:12 4230 g M Vag-Spont EPI  LIV     Name: Preece,BOY Jennessy     Apgar1: 7  Apgar5: 9    Medications Prior to Admission  Medication Sig Dispense Refill Last Dose   labetalol (NORMODYNE) 200 MG tablet Take 1 tablet (200 mg total) by mouth 2 (two) times daily. 60 tablet 0 05/08/2023 at 1000   Acetaminophen (TYLENOL PO) Take by mouth as needed.      albuterol (VENTOLIN HFA) 108 (90 Base) MCG/ACT inhaler Inhale 2 puffs into the lungs every 6 (six) hours as needed for wheezing or shortness of breath. 6.7 g 11    aspirin EC 81 MG tablet Take 1 tablet (81 mg total) by mouth daily. Swallow whole. 30 tablet 12    Continuous Blood Gluc Sensor (FREESTYLE LIBRE 3 SENSOR) MISC 1 patch by Does not apply route every 14 (fourteen) days. Place 1 sensor on the skin every 14 days. Use to check glucose continuously 2 each 8    doxylamine, Sleep, (UNISOM) 25 MG tablet Take 25 mg by mouth at bedtime as needed (nauea).      folic acid (FOLVITE) 400 MCG tablet Take 400 mcg by mouth daily.      metFORMIN (GLUCOPHAGE) 1000 MG tablet Take 1,000 mg by mouth 2 (two) times daily. (Patient not taking: Reported on 05/08/2023)   Not Taking   montelukast (SINGULAIR) 10 MG tablet Take 1 tablet (10 mg total) by mouth at bedtime. 90 tablet 1    omeprazole (PRILOSEC OTC) 20 MG tablet Take 1 tablet (20 mg total) by mouth daily. 30  tablet 6    omeprazole (PRILOSEC) 20 MG capsule Take 20 mg by mouth daily.      ondansetron (ZOFRAN-ODT) 4 MG disintegrating tablet Take 1 tablet (4 mg total) by mouth every 6 (six) hours as needed for nausea. 20 tablet 2    pyridOXINE (VITAMIN B6) 50 MG tablet Take 50 mg by mouth daily.      sertraline (ZOLOFT) 100 MG tablet Take 1.5 tablets (150 mg total) by mouth daily. 45 tablet 2      OBJECTIVE  Nursing Evaluation:   BP 133/81 (BP Location: Right Arm)   Pulse (!) 106   Temp 97.6 F (36.4 C) (Oral)   Resp 18   Ht 5\' 8"  (1.727 m)   Wt 99.3 kg   BMI 33.30 kg/m    Findings:   hypotension , new onset headache and epigastric pain . Anemia in pregnancy       NST was performed and has been reviewed by me.  NST INTERPRETATION: Category I  Mode: External Baseline Rate (A): 135 bpm Variability: Moderate Accelerations: 15 x 15 Decelerations: None     Contraction  Frequency (min): x3 ctx  ASSESSMENT Impression:  1.  Pregnancy:  G2P1001 at [redacted]w[redacted]d , EDD Estimated Date of Delivery: 06/03/23 2.  Reassuring fetal and maternal status 3.  IV hydration, improvement in bp  4. Repeat labs for evaluation or worsening pre eclampsia        Component Ref Range & Units 19:14 3 d ago 4 yr ago  Creatinine, Urine mg/dL 72 161 096  Total Protein, Urine mg/dL 20 045 CM 57 CM  Comment: NO NORMAL RANGE ESTABLISHED FOR THIS TEST  Protein Creatinine Ratio 0.00 - 0.15 mg/mg 0.28 High  0.45 High  CM 0.48 High  C        Latest Ref Rng & Units 05/08/2023    7:04 PM 05/05/2023    4:51 PM 04/24/2023    3:43 PM  CMP  Glucose 70 - 99 mg/dL 409  811  914   BUN 6 - 20 mg/dL 11  10  7    Creatinine 0.44 - 1.00 mg/dL 7.82  9.56  2.13   Sodium 135 - 145 mmol/L 131  132  136   Potassium 3.5 - 5.1 mmol/L 3.8  3.7  4.3   Chloride 98 - 111 mmol/L 103  105  104   CO2 22 - 32 mmol/L 20  15  17    Calcium 8.9 - 10.3 mg/dL 8.4  8.2  8.9   Total Protein 6.5 - 8.1 g/dL 5.4  5.8  5.3   Total Bilirubin 0.3 - 1.2  mg/dL 0.4  0.3  <0.8   Alkaline Phos 38 - 126 U/L 124  125  146   AST 15 - 41 U/L 19  21  20    ALT 0 - 44 U/L 13  12  15     CBC    Component Value Date/Time   WBC 9.3 05/08/2023 1904   RBC 3.54 (L) 05/08/2023 1904   HGB 9.4 (L) 05/08/2023 1904   HGB 10.6 (L) 04/24/2023 1543   HCT 28.6 (L) 05/08/2023 1904   HCT 33.3 (L) 04/24/2023 1543   PLT 287 05/08/2023 1904   PLT 293 04/24/2023 1543   MCV 80.8 05/08/2023 1904   MCV 83 04/24/2023 1543   MCH 26.6 05/08/2023 1904   MCHC 32.9 05/08/2023 1904   RDW 14.4 05/08/2023 1904   RDW 13.6 04/24/2023 1543   LYMPHSABS 2.0 05/05/2023 1651   LYMPHSABS 1.5 02/27/2023 1134   MONOABS 0.4 05/05/2023 1651   EOSABS 0.0 05/05/2023 1651   EOSABS 0.0 02/27/2023 1134   BASOSABS 0.0 05/05/2023 1651   BASOSABS 0.0 02/27/2023 1134   Anemia- iron infusion given   PLAN 1. Current condition and above findings reviewed.  Reassuring fetal and maternal condition. Labetalol dose decreased to 100 mg bid 2. Discharge home with standard labor precautions given to return to L&D or call the office for problems. 3. Continue routine prenatal care. Follow up in office 2 days for BP check.   Dr. Logan Bores consulted on plan of care and in agreement.   I was present and evaluated this pt in person.   Doreene Burke, CNM

## 2023-05-08 NOTE — OB Triage Note (Signed)
Patient is a G2P1 at [redacted]w[redacted]d who was sent over from the office after she had a dizzy spell and her blood pressure was too low. Reports starting 200mg  labetalol BID last night and not being able to eat anything today because she was too tired. Reports irregular ctx and +FM, denies LOF and vaginal bleeding. External monitors applied and assessing. Initial FHT 140. Initial BP 135/76, cycling q .

## 2023-05-08 NOTE — Progress Notes (Signed)
    NURSE VISIT NOTE  Subjective:    Patient ID: Brianna Marshall, female    DOB: 1991/06/15, 32 y.o.   MRN: [id number]  HPI  Patient is a 32 y.o. G22P1001 female who presents for BP check per order from Hildred Laser, MD. She has had some dizziness when changing positions. She continues to have some visual disturbances, such as focusing. She denies chest pain, palpitations and sob.   Patient reports compliance with prescribed BP medications: yes Labetalol 200 mg twice daily Last dose of BP medication:  this morning at 10:00 am  BP Readings from Last 3 Encounters:  05/08/23 107/63  05/05/23 (!) 142/89  05/05/23 (!) 140/96   Pulse Readings from Last 3 Encounters:  05/05/23 93  05/05/23 (!) 121  04/28/23 (!) 103    Objective:    BP (!) 87/60   Pulse (!) 103   Resp 16   Ht 5\' 8"  (1.727 m)   Wt 219 lb 11.2 oz (99.7 kg)   BMI 33.41 kg/m   Assessment:   1. Blood pressure check      Plan:   Per Dr. Brennan Bailey, MD Patient was given some juice and crackers and told to rest in exam room before leaving. Treatment: Decrease dose of Labetalol 200 mg twice daily to Labetalol 100 mg twice daily   Transfer patient to L&D for IV fluid per Guadlupe Spanish, CNM Continue to monitor blood pressure at home and rest. Follow up for Nurse Visit: Blood pressure check in 2 days.  Patient verbalized understanding of instructions.     Santiago Bumpers, CMA [facility]

## 2023-05-08 NOTE — Patient Instructions (Signed)
Hypertension During Pregnancy Hypertension is also called high blood pressure. High blood pressure means that the force of the blood moving in your body is high enough to cause problems for you and your baby. Different types of high blood pressure can happen during pregnancy. The types are: High blood pressure before you got pregnant. This is called chronic hypertension.  This can continue during your pregnancy. Your doctor will want to keep checking your blood pressure. You may need medicine to control your blood pressure while you are pregnant. You will need follow-up visits after you have your baby. High blood pressure that goes up during pregnancy when it was normal before. This is called gestational hypertension. It will often get better after you have your baby, but your doctor will need to watch your blood pressure to make sure that it is getting better. You may develop high blood pressure after giving birth. This is called postpartum hypertension. This often occurs within 48 hours after childbirth but may occur up to 6 weeks after giving birth. Very high blood pressure during pregnancy is an emergency that needs treatment right away. How does this affect me? If you have high blood pressure during pregnancy, you have a higher chance of developing high blood pressure: As you get older. If you get pregnant again. In some cases, high blood pressure during pregnancy can cause: Stroke. Heart attack. Damage to the kidneys, lungs, or liver. Preeclampsia. HELLP syndrome. Seizures. Problems with the placenta. How does this affect my baby? Your baby may: Be born early. Not weigh as much as he or she should. Not handle labor well, leading to a C-section. This condition may also result in a baby's death before birth (stillbirth). What are the risks? Having high blood pressure during a past pregnancy. Being overweight. Being age 35 or older. Being pregnant for the first time. Being pregnant  with more than one baby. Becoming pregnant using fertility methods, such as IVF. Having other problems, such as diabetes or kidney disease. What can I do to lower my risk?  Keep a healthy weight. Eat a healthy diet. Follow what your doctor tells you about treating any medical problems that you had before you got pregnant. It is very important to go to all of your doctor visits. Your doctor will check your blood pressure and make sure that your pregnancy is progressing as it should. Treatment should start early if a problem is found. How is this treated? Treatment for high blood pressure during pregnancy can vary. It depends on the type of high blood pressure you have and how serious it is. If you were taking medicine for your blood pressure before you got pregnant, talk with your doctor. You may need to change the medicine during pregnancy if it is not safe for your baby. If your blood pressure goes up during pregnancy, your doctor may order medicine to treat this. If you are at risk for preeclampsia, your doctor may tell you to take a low-dose aspirin while you are pregnant. If you have very high blood pressure, you may need to stay in the hospital so you and your baby can be watched closely. You may also need to take medicine to lower your blood pressure. In some cases, if your condition gets worse, you may need to have your baby early. Follow these instructions at home: Eating and drinking  Drink enough fluid to keep your pee (urine) pale yellow. Avoid caffeine. Lifestyle Do not smoke or use any products that contain   nicotine or tobacco. If you need help quitting, ask your doctor. Do not use alcohol or drugs. Avoid stress. Rest and get plenty of sleep. Regular exercise can help. Ask your doctor what kinds of exercise are best for you. General instructions Take over-the-counter and prescription medicines only as told by your doctor. Keep all prenatal and follow-up visits. Contact a  doctor if: You have symptoms that your doctor told you to watch for, such as: Headaches. A feeling like you may vomit (nausea). Vomiting. Belly (abdominal) pain. Feeling dizzy or light-headed. Get help right away if: You have symptoms of serious problems, such as: Very bad belly pain that does not get better with treatment. A very bad headache that does not get better. Blurry vision. Double vision. Vomiting that does not get better. Sudden, fast weight gain. Sudden swelling in your hands, ankles, or face. Bleeding from your vagina. Blood in your pee. Shortness of breath. Chest pain. Weakness on one side of your body. Trouble talking. Your baby is not moving as much as usual. These symptoms may be an emergency. Get help right away. Call your local emergency services (911 in the [place] Do not wait to see if the symptoms will go away. Do not drive yourself to the hospital. Summary High blood pressure is also called hypertension. High blood pressure means that the force of the blood moving in your body is high enough to cause problems for you and your baby. Get help right away if you have symptoms of serious problems due to high blood pressure. Keep all prenatal and follow-up visits. This information is not intended to replace advice given to you by your health care provider. Make sure you discuss any questions you have with your health care provider. Document Revised: 08/27/2020 Document Reviewed: 08/27/2020 Elsevier Patient Education  2023 Elsevier Inc.  

## 2023-05-09 DIAGNOSIS — O2653 Maternal hypotension syndrome, third trimester: Secondary | ICD-10-CM | POA: Diagnosis not present

## 2023-05-09 LAB — CERVICOVAGINAL ANCILLARY ONLY
Chlamydia: NEGATIVE
Comment: NEGATIVE
Comment: NORMAL
Neisseria Gonorrhea: NEGATIVE

## 2023-05-09 NOTE — Progress Notes (Signed)
Patient discharged home per Provider order. Discharge education completed with patient and support person including follow up instructions, appointments, and medication list. She received labor and bleeding precautions. Patient verbalized understanding and reported no further questions. Patient discharged home via personal vehicle with support person and all belongings.

## 2023-05-10 ENCOUNTER — Telehealth: Payer: Self-pay

## 2023-05-10 ENCOUNTER — Ambulatory Visit (INDEPENDENT_AMBULATORY_CARE_PROVIDER_SITE_OTHER): Payer: BC Managed Care – PPO

## 2023-05-10 VITALS — BP 140/104 | Ht 68.0 in | Wt 224.0 lb

## 2023-05-10 DIAGNOSIS — O1493 Unspecified pre-eclampsia, third trimester: Secondary | ICD-10-CM

## 2023-05-10 DIAGNOSIS — Z3A36 36 weeks gestation of pregnancy: Secondary | ICD-10-CM

## 2023-05-10 LAB — CBC
Hematocrit: 31.6 % — ABNORMAL LOW (ref 34.0–46.6)
Hemoglobin: 10.1 g/dL — ABNORMAL LOW (ref 11.1–15.9)
MCH: 25.7 pg — ABNORMAL LOW (ref 26.6–33.0)
MCHC: 32 g/dL (ref 31.5–35.7)
MCV: 80 fL (ref 79–97)
Platelets: 305 10*3/uL (ref 150–450)
RBC: 3.93 x10E6/uL (ref 3.77–5.28)
RDW: 14.1 % (ref 11.7–15.4)
WBC: 10.4 10*3/uL (ref 3.4–10.8)

## 2023-05-10 LAB — COMPREHENSIVE METABOLIC PANEL
ALT: 14 IU/L (ref 0–32)
AST: 19 IU/L (ref 0–40)
Albumin/Globulin Ratio: 1.3 (ref 1.2–2.2)
Albumin: 3.1 g/dL — ABNORMAL LOW (ref 3.9–4.9)
Alkaline Phosphatase: 174 IU/L — ABNORMAL HIGH (ref 44–121)
BUN/Creatinine Ratio: 11 (ref 9–23)
BUN: 7 mg/dL (ref 6–20)
Bilirubin Total: <0.2 mg/dL (ref 0.0–1.2)
CO2: 22 mmol/L (ref 20–29)
Calcium: 8.9 mg/dL (ref 8.7–10.2)
Chloride: 104 mmol/L (ref 96–106)
Creatinine, Ser: 0.64 mg/dL (ref 0.57–1.00)
Globulin, Total: 2.3 g/dL (ref 1.5–4.5)
Glucose: 89 mg/dL (ref 70–99)
Potassium: 5 mmol/L (ref 3.5–5.2)
Sodium: 136 mmol/L (ref 134–144)
Total Protein: 5.4 g/dL — ABNORMAL LOW (ref 6.0–8.5)
eGFR: 121 mL/min/{1.73_m2} (ref >59)

## 2023-05-10 LAB — PROTEIN / CREATININE RATIO, URINE
Creatinine, Urine: 194.4 mg/dL
Protein, Ur: 160.6 mg/dL
Protein/Creat Ratio: 826 mg/g creat — ABNORMAL HIGH (ref 0–200)

## 2023-05-10 NOTE — Telephone Encounter (Signed)
Pt calling; was seen today for a BP ck which was elevated and labs done stat; hasn't heard anything about results; would like to know if she is having a baby tonight.  [phone]

## 2023-05-10 NOTE — Telephone Encounter (Signed)
All of her labs had not resulted prior to her calling but have now all been updated.  Her pre-eclampsia labs have only slightly worsened, but not significant enough to move her induction at this time. We will keep her induction scheduled for Tuesday unless she begins to develop symptoms (headaches unrelieved by Tylenol, RUQ pain that is intense and constant, vision changes (feeling like there is a curtain shade over her eyes, one-sided loss of vision), or if her blood pressures become significantly higher when checking at home despite the increase in her medication back to 200 mg BID.

## 2023-05-10 NOTE — Telephone Encounter (Addendum)
Pt aware; asked what was 'significantly higher Bps; adv 150/115 or if she isn't comfortable with her Bps to let us know.

## 2023-05-10 NOTE — Progress Notes (Signed)
    NURSE VISIT NOTE  Subjective:    Patient ID: Brianna Marshall, female    DOB: [birth year], 32 y.o.   MRN: [id number]  HPI  Patient is a 32 y.o. G80P1001 female who presents for BP check per order from Doreene Burke, CNM.   Patient reports compliance with prescribed BP medications: yes Labetalol 100 mgtwice daily Last dose of BP medication: 745am    BP Readings from Last 3 Encounters:  05/10/23 (!) 140/104  05/09/23 130/89  05/08/23 120/79   Pulse Readings from Last 3 Encounters:  05/09/23 96  05/08/23 (!) 104  05/05/23 93    Objective:    BP (!) 140/104   Ht 5\' 8"  (1.727 m)   Wt 224 lb (101.6 kg)   BMI 34.06 kg/m   Assessment:   1. Pre-eclampsia in third trimester      Plan:   Per Dr. Hildred Laser, MD:  Treatment: Increase dose of Labetalol 100 mg twice daily to Labetalol 200 mg twice daily   Labs per Dr Valentino Saxon ordered CMP CBC Urine CREAT Patient verbalized understanding of instructions.   Loney Laurence, CMA

## 2023-05-12 ENCOUNTER — Ambulatory Visit (INDEPENDENT_AMBULATORY_CARE_PROVIDER_SITE_OTHER): Payer: BC Managed Care – PPO | Admitting: Certified Nurse Midwife

## 2023-05-12 VITALS — BP 133/90 | HR 121 | Wt 221.0 lb

## 2023-05-12 DIAGNOSIS — Z3A36 36 weeks gestation of pregnancy: Secondary | ICD-10-CM

## 2023-05-12 DIAGNOSIS — Z3483 Encounter for supervision of other normal pregnancy, third trimester: Secondary | ICD-10-CM

## 2023-05-12 LAB — POCT URINALYSIS DIPSTICK OB
Bilirubin, UA: NEGATIVE
Blood, UA: NEGATIVE
Glucose, UA: NEGATIVE
Ketones, UA: NEGATIVE
Leukocytes, UA: NEGATIVE
Nitrite, UA: NEGATIVE
Spec Grav, UA: 1.01 (ref 1.010–1.025)
Urobilinogen, UA: 1 E.U./dL
pH, UA: 7 (ref 5.0–8.0)

## 2023-05-12 NOTE — Progress Notes (Signed)
ROB doing well, feeling good movement. Pt denies any changes in regards to pre eclampsia signs /symptoms. She states her BS are well controlled with fasting's less than 100's ,  ( 1-2 out of range in 102 ) 2 hr pp in normal range . She did not bring in a log. She has induction scheduled on Tuesday. Discussed going to hospital should anything change priror to inductions. She verbalizes and agree.   Doreene Burke, CNM

## 2023-05-12 NOTE — Patient Instructions (Signed)

## 2023-05-14 ENCOUNTER — Observation Stay (HOSPITAL_BASED_OUTPATIENT_CLINIC_OR_DEPARTMENT_OTHER)
Admission: EM | Admit: 2023-05-14 | Discharge: 2023-05-14 | Disposition: A | Discharge status: Home / Self Care | Payer: BC Managed Care – PPO | Source: Home / Self Care | Admitting: Advanced Practice Midwife

## 2023-05-14 ENCOUNTER — Encounter: Payer: Self-pay | Admitting: Obstetrics and Gynecology

## 2023-05-14 ENCOUNTER — Other Ambulatory Visit: Payer: Self-pay

## 2023-05-14 DIAGNOSIS — O26893 Other specified pregnancy related conditions, third trimester: Secondary | ICD-10-CM

## 2023-05-14 DIAGNOSIS — Z3A37 37 weeks gestation of pregnancy: Secondary | ICD-10-CM | POA: Insufficient documentation

## 2023-05-14 DIAGNOSIS — O99613 Diseases of the digestive system complicating pregnancy, third trimester: Secondary | ICD-10-CM | POA: Insufficient documentation

## 2023-05-14 DIAGNOSIS — O0993 Supervision of high risk pregnancy, unspecified, third trimester: Secondary | ICD-10-CM

## 2023-05-14 DIAGNOSIS — O219 Vomiting of pregnancy, unspecified: Secondary | ICD-10-CM | POA: Diagnosis present

## 2023-05-14 DIAGNOSIS — Z79899 Other long term (current) drug therapy: Secondary | ICD-10-CM | POA: Insufficient documentation

## 2023-05-14 DIAGNOSIS — O99513 Diseases of the respiratory system complicating pregnancy, third trimester: Secondary | ICD-10-CM | POA: Insufficient documentation

## 2023-05-14 DIAGNOSIS — R12 Heartburn: Secondary | ICD-10-CM | POA: Insufficient documentation

## 2023-05-14 DIAGNOSIS — Z7982 Long term (current) use of aspirin: Secondary | ICD-10-CM | POA: Insufficient documentation

## 2023-05-14 DIAGNOSIS — O1414 Severe pre-eclampsia complicating childbirth: Secondary | ICD-10-CM | POA: Diagnosis present

## 2023-05-14 DIAGNOSIS — O1490 Unspecified pre-eclampsia, unspecified trimester: Secondary | ICD-10-CM | POA: Diagnosis present

## 2023-05-14 DIAGNOSIS — O1493 Unspecified pre-eclampsia, third trimester: Secondary | ICD-10-CM | POA: Diagnosis not present

## 2023-05-14 DIAGNOSIS — J45909 Unspecified asthma, uncomplicated: Secondary | ICD-10-CM | POA: Insufficient documentation

## 2023-05-14 DIAGNOSIS — Z8616 Personal history of COVID-19: Secondary | ICD-10-CM | POA: Insufficient documentation

## 2023-05-14 DIAGNOSIS — Z91040 Latex allergy status: Secondary | ICD-10-CM | POA: Insufficient documentation

## 2023-05-14 DIAGNOSIS — O212 Late vomiting of pregnancy: Secondary | ICD-10-CM | POA: Insufficient documentation

## 2023-05-14 LAB — COMPREHENSIVE METABOLIC PANEL
ALT: 18 U/L (ref 0–44)
AST: 24 U/L (ref 15–41)
Albumin: 2.5 g/dL — ABNORMAL LOW (ref 3.5–5.0)
Alkaline Phosphatase: 151 U/L — ABNORMAL HIGH (ref 38–126)
Anion gap: 8 (ref 5–15)
BUN: 17 mg/dL (ref 6–20)
CO2: 23 mmol/L (ref 22–32)
Calcium: 8.7 mg/dL — ABNORMAL LOW (ref 8.9–10.3)
Chloride: 103 mmol/L (ref 98–111)
Creatinine, Ser: 0.77 mg/dL (ref 0.44–1.00)
GFR, Estimated: >60 mL/min (ref >60)
Glucose, Bld: 90 mg/dL (ref 70–99)
Potassium: 4.2 mmol/L (ref 3.5–5.1)
Sodium: 134 mmol/L — ABNORMAL LOW (ref 135–145)
Total Bilirubin: 0.6 mg/dL (ref 0.3–1.2)
Total Protein: 6.2 g/dL — ABNORMAL LOW (ref 6.5–8.1)

## 2023-05-14 LAB — CBC
HCT: 34.3 % — ABNORMAL LOW (ref 36.0–46.0)
Hemoglobin: 11 g/dL — ABNORMAL LOW (ref 12.0–15.0)
MCH: 26.4 pg (ref 26.0–34.0)
MCHC: 32.1 g/dL (ref 30.0–36.0)
MCV: 82.5 fL (ref 80.0–100.0)
Platelets: 335 10*3/uL (ref 150–400)
RBC: 4.16 MIL/uL (ref 3.87–5.11)
RDW: 16.3 % — ABNORMAL HIGH (ref 11.5–15.5)
WBC: 9.1 10*3/uL (ref 4.0–10.5)
nRBC: 0.2 % (ref 0.0–0.2)

## 2023-05-14 LAB — PROTEIN / CREATININE RATIO, URINE
Creatinine, Urine: 163 mg/dL
Protein Creatinine Ratio: 0.42 mg/mg{Cre} — ABNORMAL HIGH (ref 0.00–0.15)
Total Protein, Urine: 68 mg/dL

## 2023-05-14 MED ORDER — LACTATED RINGERS IV SOLN: INTRAVENOUS | Status: DC

## 2023-05-14 MED ORDER — LACTATED RINGERS IV BOLUS
1000.0000 mL | Freq: Once | INTRAVENOUS | Status: AC
  Administered 2023-05-14: 1000 mL via INTRAVENOUS

## 2023-05-14 MED ORDER — SOD CITRATE-CITRIC ACID 500-334 MG/5ML PO SOLN
30.0000 mL | Freq: Once | ORAL | Status: AC
  Administered 2023-05-14: 30 mL via ORAL

## 2023-05-14 NOTE — Discharge Instructions (Signed)
Return for IOL on Tuesday 05/16/23

## 2023-05-14 NOTE — Discharge Summary (Signed)
Physician Final Progress Note  Patient ID: Brianna Marshall MRN: [id number] DOB/AGE: 1991/11/24 32 y.o.  Admit date: 05/14/2023 Admitting provider: Tresea Mall, CNM Discharge date: 05/14/2023   Admission Diagnoses:  1) intrauterine pregnancy at [redacted]w[redacted]d  2) nausea and vomiting of pregnancy  Discharge Diagnoses:  Principal Problem:   Labor and delivery, indication for care Active Problems:   Preeclampsia   Nausea and vomiting in pregnancy   Heartburn during pregnancy in third trimester   [redacted] weeks gestation of pregnancy    History of Present Illness: The patient is a 32 y.o. female G2P1001 at [redacted]w[redacted]d who presents for nausea, vomiting and diarrhea that started yesterday. She also admits irregular contractions that started this evening. She is admitted for observation, placed on monitors, IV started with fluid bolus. PIH labs collected. She denies headache or visual changes currently. She has had epigastric pain for several weeks. Monitoring does show irregular contractions and category I fetal tracing. Labs and Cervical exam are reassuring; FT/thick/high. She feels better after fluids and is able to keep liquids and crackers down prior to discharge. She is discharged to home with instructions and precautions. She has scheduled IOL on Tuesday.  Past Medical History:  Diagnosis Date   Anemia in pregnancy 05/30/2018   Antepartum mild preeclampsia, third trimester 07/30/2018   Asthma    COVID-19 02/2020   Depression    Family history of pancreatic cancer    Gestational diabetes 06/11/2018   Gestational diabetes mellitus (GDM) affecting third pregnancy 07/29/2018   Headache, migraine 07/14/2015   approx 2x/month   Hyperemesis 06/05/2018   Infertility, anovulation 03/10/2017   Nausea/vomiting in pregnancy 01/17/2018   Obesity    PCOS (polycystic ovarian syndrome) 07/08/2016   Prediabetes 07/08/2016   Hgb 5.8  04/2015    Supervision of high risk pregnancy, antepartum  01/02/2018   [facility] Prenatal Labs Dating Early [place] Blood type: O/Positive/-- (01/29 1038)  Genetic Screen 1 Screen: Neg Antibody:Negative (01/29 1038) Anatomic [place] Normal, anterior placenta,  Rubella: <0.90 (01/29 1038) Varicella: Imm GTT Early: 123 Third trimester:  RPR: Non Reactive (01/29 1038)  Rhogam n/a HBsAg: Negative (01/29 1038)  TDaP vaccine        05/30/2018    Flu Shot: 09/2017 HIV: Non Reac   Vertigo    for short time after covid   Vitamin D deficiency 07/08/2016   18.7 in 04/2015     Past Surgical History:  Procedure Laterality Date   ENDOSCOPIC CONCHA BULLOSA RESECTION Right 12/30/2021   Procedure: RIGHT CONCHA BULLOSA REDUCTION VIA NASAL ENDOSCOPY;  Surgeon: Vernie Murders, MD;  Location: [facility];  Service: ENT;  Laterality: Right;   INCISE AND DRAIN ABCESS Right 2011   Posterior Thigh   sebaceous cyst removal  07/2017   Back of neck   SEPTOPLASTY N/A 12/30/2021   Procedure: SEPTOPLASTY;  Surgeon: Vernie Murders, MD;  Location: [facility];  Service: ENT;  Laterality: N/A;   TURBINATE REDUCTION Right 12/30/2021   Procedure: PARTIAL EXCISION RIGHT INFERIOR TURBINATE;  Surgeon: Vernie Murders, MD;  Location: [facility];  Service: ENT;  Laterality: Right;   WISDOM TOOTH EXTRACTION      No current facility-administered medications on file prior to encounter.   Current Outpatient Medications on File Prior to Encounter  Medication Sig Dispense Refill   Acetaminophen (TYLENOL PO) Take by mouth as needed.     aspirin EC 81 MG tablet Take 1 tablet (81 mg total) by mouth daily. Swallow whole. 30 tablet  12   Continuous Blood Gluc Sensor (FREESTYLE LIBRE 3 SENSOR) MISC 1 patch by Does not apply route every 14 (fourteen) days. Place 1 sensor on the skin every 14 days. Use to check glucose continuously 2 each 8   doxylamine, Sleep, (UNISOM) 25 MG tablet Take 25 mg by mouth at bedtime as needed (nauea).     folic acid (FOLVITE) 400 MCG tablet Take 400 mcg  by mouth daily.     labetalol (NORMODYNE) 200 MG tablet Take 1 tablet (200 mg total) by mouth 2 (two) times daily. 60 tablet 0   montelukast (SINGULAIR) 10 MG tablet Take 1 tablet (10 mg total) by mouth at bedtime. 90 tablet 1   omeprazole (PRILOSEC) 20 MG capsule Take 20 mg by mouth daily.     ondansetron (ZOFRAN-ODT) 4 MG disintegrating tablet Take 1 tablet (4 mg total) by mouth every 6 (six) hours as needed for nausea. 20 tablet 2   pyridOXINE (VITAMIN B6) 50 MG tablet Take 50 mg by mouth daily.     sertraline (ZOLOFT) 100 MG tablet Take 1.5 tablets (150 mg total) by mouth daily. 45 tablet 2    Allergies  Allergen Reactions   Shellfish Allergy Nausea And Vomiting and Nausea Only   Latex Dermatitis   Amoxicillin Other (See Comments) and Rash   Lidocaine Rash    topical    Social History   Socioeconomic History   Marital status: Married    Spouse name: Denyse Amass   Number of children: 1   Years of education: 12   Highest education level: High school graduate  Occupational History   Occupation: Printmaker  Tobacco Use   Smoking status: Never   Smokeless tobacco: Never  Vaping Use   Vaping Use: Never used  Substance and Sexual Activity   Alcohol use: Not Currently    Alcohol/week: 0.0 standard drinks of alcohol    Comment: rarely   Drug use: No   Sexual activity: Yes    Partners: Male    Comment: undecided  Other Topics Concern   Not on file  Social History Narrative   Not on file   Social Determinants of Health   Financial Resource Strain: Medium Risk (11/16/2022)   Overall Financial Resource Strain (CARDIA)    Difficulty of Paying Living Expenses: Somewhat hard  Food Insecurity: Food Insecurity Present (11/16/2022)   Hunger Vital Sign    Worried About Running Out of Food in the Last Year: Sometimes true    Ran Out of Food in the Last Year: Sometimes true  Transportation Needs: No Transportation Needs (06/14/2022)   PRAPARE - Scientist, research (physical sciences) (Medical): No    Lack of Transportation (Non-Medical): No  Physical Activity: Insufficiently Active (11/16/2022)   Exercise Vital Sign    Days of Exercise per Week: 3 days    Minutes of Exercise per Session: 20 min  Stress: No Stress Concern Present (11/16/2022)   [employer] of Occupational Health - Occupational Stress Questionnaire    Feeling of Stress : Only a little  Social Connections: Moderately Isolated (11/16/2022)   Social Connection and Isolation Panel [NHANES]    Frequency of Communication with Friends and Family: More than three times a week    Frequency of Social Gatherings with Friends and Family: Never    Attends Religious Services: Never    Database administrator or Organizations: No    Attends Banker Meetings: Never    Marital Status: Married  Intimate Partner Violence: Not At Risk (11/16/2022)   Humiliation, Afraid, Rape, and Kick questionnaire    Fear of Current or Ex-Partner: No    Emotionally Abused: No    Physically Abused: No    Sexually Abused: No    Family History  Problem Relation Age of Onset   Osteoporosis Mother    Alcohol abuse Father    Drug abuse Father    Healthy Sister    ADD / ADHD Brother    OCD Brother    Diabetes Maternal Grandmother    Pancreatic cancer Maternal Grandmother 26   Depression Paternal Grandmother    COPD Paternal Grandmother    Alcohol abuse Maternal Aunt    Bipolar disorder Maternal Aunt    Diabetes Maternal Aunt    Breast cancer Maternal Aunt 61       BRCA neg   Diabetes Maternal Uncle    Prostate cancer Maternal Uncle      Review of Systems  Constitutional:  Negative for chills and fever.  HENT:  Negative for congestion, ear discharge, ear pain, hearing loss, sinus pain and sore throat.   Eyes:  Negative for blurred vision and double vision.  Respiratory:  Negative for cough, shortness of breath and wheezing.   Cardiovascular:  Negative for chest pain, palpitations and leg  swelling.  Gastrointestinal:  Positive for abdominal pain, diarrhea, nausea and vomiting. Negative for blood in stool, constipation, heartburn and melena.  Genitourinary:  Negative for dysuria, flank pain, frequency, hematuria and urgency.  Musculoskeletal:  Negative for back pain, joint pain and myalgias.  Skin:  Negative for itching and rash.  Neurological:  Negative for dizziness, tingling, tremors, sensory change, speech change, focal weakness, seizures, loss of consciousness, weakness and headaches.  Endo/Heme/Allergies:  Negative for environmental allergies. Does not bruise/bleed easily.  Psychiatric/Behavioral:  Negative for depression, hallucinations, memory loss, substance abuse and suicidal ideas. The patient is not nervous/anxious and does not have insomnia.      Physical Exam: BP (!) 141/85   Pulse (!) 101   Temp 98.3 F (36.8 C) (Oral)   Resp 18   Ht 5\' 8"  (1.727 m)   Wt 100.7 kg   BMI 33.75 kg/m   Constitutional: Well nourished, well developed female in no acute distress.  HEENT: normal Skin: Warm and dry.  Cardiovascular: Regular rate and rhythm.   Extremity:  no edema   Respiratory: Clear to auscultation bilateral. Normal respiratory effort Abdomen: FHT present Neuro: DTRs 2+ Psych: Alert and Oriented x3. No memory deficits. Normal mood and affect.  MS: normal gait, normal bilateral lower extremity ROM/strength/stability.  Pelvic exam:  is not limited by body habitus EGBUS: within normal limits Vagina: within normal limits and with normal mucosa  Cervix: FT/thick/high  Toco: every 3-7, mild to palpation Fetal well being: 135 bpm, moderate variability, +accelerations, -decelerations  Consults: None  Significant Findings/ Diagnostic Studies: labs:   Latest Reference Range & Units 05/14/23 15:42 05/14/23 17:34  COMPREHENSIVE METABOLIC PANEL  Rpt !   Sodium 135 - 145 mmol/L 134 (L)   Potassium 3.5 - 5.1 mmol/L 4.2   Chloride 98 - 111 mmol/L 103   CO2 22 -  32 mmol/L 23   Glucose 70 - 99 mg/dL 90   BUN 6 - 20 mg/dL 17   Creatinine 1.30 - 1.00 mg/dL 8.65   Calcium 8.9 - 78.4 mg/dL 8.7 (L)   Anion gap 5 - 15  8   Alkaline Phosphatase 38 - 126 U/L 151 (H)  Albumin 3.5 - 5.0 g/dL 2.5 (L)   AST 15 - 41 U/L 24   ALT 0 - 44 U/L 18   Total Protein 6.5 - 8.1 g/dL 6.2 (L)   Total Bilirubin 0.3 - 1.2 mg/dL 0.6   GFR, Estimated >16 mL/min >60   WBC 4.0 - 10.5 K/uL 9.1   RBC 3.87 - 5.11 MIL/uL 4.16   Hemoglobin 12.0 - 15.0 g/dL 10.9 (L)   HCT 60.4 - 54.0 % 34.3 (L)   MCV 80.0 - 100.0 fL 82.5   MCH 26.0 - 34.0 pg 26.4   MCHC 30.0 - 36.0 g/dL 98.1   RDW 19.1 - 47.8 % 16.3 (H)   Platelets 150 - 400 K/uL 335   nRBC 0.0 - 0.2 % 0.2   Total Protein, Urine mg/dL  68  Protein Creatinine Ratio 0.00 - 0.15 mg/mgCre  0.42 (H)  Creatinine, Urine mg/dL  295  !: Data is abnormal (L): Data is abnormally low (H): Data is abnormally high Rpt: View report in Results Review for more information  Procedures: NST  Hospital Course: The patient was admitted to Labor and Delivery Triage for observation.   Discharge Condition: good  Disposition: Discharge disposition: 01-Home or Self Care  Diet: Diabetic diet  Discharge Activity: Activity as tolerated  Discharge Instructions     Discharge activity:  No Restrictions   Complete by: As directed    As tolerated   Discharge diet:   Complete by: As directed    Carb modified      Allergies as of 05/14/2023       Reactions   Shellfish Allergy Nausea And Vomiting, Nausea Only   Latex Dermatitis   Amoxicillin Other (See Comments), Rash   Lidocaine Rash   topical        Medication List     STOP taking these medications    albuterol 108 (90 Base) MCG/ACT inhaler Commonly known as: VENTOLIN HFA   metFORMIN 1000 MG tablet Commonly known as: GLUCOPHAGE       TAKE these medications    aspirin EC 81 MG tablet Take 1 tablet (81 mg total) by mouth daily. Swallow whole.   doxylamine (Sleep)  25 MG tablet Commonly known as: UNISOM Take 25 mg by mouth at bedtime as needed (nauea).   folic acid 400 MCG tablet Commonly known as: FOLVITE Take 400 mcg by mouth daily.   FreeStyle Libre 3 Sensor Misc 1 patch by Does not apply route every 14 (fourteen) days. Place 1 sensor on the skin every 14 days. Use to check glucose continuously   labetalol 200 MG tablet Commonly known as: NORMODYNE Take 1 tablet (200 mg total) by mouth 2 (two) times daily.   montelukast 10 MG tablet Commonly known as: SINGULAIR Take 1 tablet (10 mg total) by mouth at bedtime.   omeprazole 20 MG capsule Commonly known as: PRILOSEC Take 20 mg by mouth daily.   ondansetron 4 MG disintegrating tablet Commonly known as: ZOFRAN-ODT Take 1 tablet (4 mg total) by mouth every 6 (six) hours as needed for nausea.   pyridOXINE 50 MG tablet Commonly known as: VITAMIN B6 Take 50 mg by mouth daily.   sertraline 100 MG tablet Commonly known as: ZOLOFT Take 1.5 tablets (150 mg total) by mouth daily.   TYLENOL PO Take by mouth as needed.         Total time spent taking care of this patient: 41 minutes  Signed: Tresea Mall, CNM  05/14/2023, 9:07 PM

## 2023-05-14 NOTE — OB Triage Note (Signed)
Pt is a 32yo G2P1, 37w 1d. She arrived to the unit with complaints of nausea and vomiting since 5/25 at 0800 and diarrhea since today at 0800. She states that she has vomited at least 4 times in the last 24hrs and cannot keep anything down. She states that she took 4mg  of PO Zofran at 1000.   She states she has had R epigastric pain that has been ongoing for a month now. She does not have a HA present, no vision disturbances, and on assessment there is 2+ DTR and absent clonus.  She denies vaginal bleeding, reports positive fetal movement, and reports no contractions. VS stable, monitors applied and assessing.   Initial FHT 145 at 1448.  CNM aware of pt arrival.

## 2023-05-16 ENCOUNTER — Inpatient Hospital Stay: Payer: BC Managed Care – PPO | Admitting: Anesthesiology

## 2023-05-16 ENCOUNTER — Inpatient Hospital Stay
Admission: EM | Admit: 2023-05-16 | Discharge: 2023-05-18 | DRG: 806 | Disposition: A | Discharge status: Home / Self Care | Payer: BC Managed Care – PPO | Attending: Certified Nurse Midwife | Admitting: Certified Nurse Midwife

## 2023-05-16 ENCOUNTER — Encounter: Payer: Self-pay | Admitting: Certified Nurse Midwife

## 2023-05-16 ENCOUNTER — Other Ambulatory Visit: Payer: Self-pay

## 2023-05-16 DIAGNOSIS — Z3A37 37 weeks gestation of pregnancy: Secondary | ICD-10-CM | POA: Diagnosis not present

## 2023-05-16 DIAGNOSIS — O99613 Diseases of the digestive system complicating pregnancy, third trimester: Secondary | ICD-10-CM | POA: Diagnosis present

## 2023-05-16 DIAGNOSIS — O9902 Anemia complicating childbirth: Secondary | ICD-10-CM

## 2023-05-16 DIAGNOSIS — O99513 Diseases of the respiratory system complicating pregnancy, third trimester: Secondary | ICD-10-CM | POA: Diagnosis present

## 2023-05-16 DIAGNOSIS — Z7982 Long term (current) use of aspirin: Secondary | ICD-10-CM

## 2023-05-16 DIAGNOSIS — O212 Late vomiting of pregnancy: Secondary | ICD-10-CM | POA: Diagnosis present

## 2023-05-16 DIAGNOSIS — D62 Acute posthemorrhagic anemia: Secondary | ICD-10-CM | POA: Diagnosis not present

## 2023-05-16 DIAGNOSIS — O9081 Anemia of the puerperium: Secondary | ICD-10-CM | POA: Diagnosis not present

## 2023-05-16 DIAGNOSIS — O403XX0 Polyhydramnios, third trimester, not applicable or unspecified: Secondary | ICD-10-CM | POA: Diagnosis present

## 2023-05-16 DIAGNOSIS — J45909 Unspecified asthma, uncomplicated: Secondary | ICD-10-CM | POA: Diagnosis present

## 2023-05-16 DIAGNOSIS — O99214 Obesity complicating childbirth: Secondary | ICD-10-CM | POA: Diagnosis present

## 2023-05-16 DIAGNOSIS — Z8616 Personal history of COVID-19: Secondary | ICD-10-CM | POA: Diagnosis not present

## 2023-05-16 DIAGNOSIS — O1404 Mild to moderate pre-eclampsia, complicating childbirth: Principal | ICD-10-CM | POA: Diagnosis present

## 2023-05-16 DIAGNOSIS — O0993 Supervision of high risk pregnancy, unspecified, third trimester: Secondary | ICD-10-CM

## 2023-05-16 DIAGNOSIS — O24410 Gestational diabetes mellitus in pregnancy, diet controlled: Secondary | ICD-10-CM

## 2023-05-16 DIAGNOSIS — O26893 Other specified pregnancy related conditions, third trimester: Secondary | ICD-10-CM

## 2023-05-16 DIAGNOSIS — O24420 Gestational diabetes mellitus in childbirth, diet controlled: Secondary | ICD-10-CM | POA: Diagnosis present

## 2023-05-16 DIAGNOSIS — O99344 Other mental disorders complicating childbirth: Secondary | ICD-10-CM | POA: Diagnosis present

## 2023-05-16 DIAGNOSIS — O1414 Severe pre-eclampsia complicating childbirth: Secondary | ICD-10-CM | POA: Diagnosis present

## 2023-05-16 DIAGNOSIS — D649 Anemia, unspecified: Secondary | ICD-10-CM | POA: Diagnosis not present

## 2023-05-16 DIAGNOSIS — O1493 Unspecified pre-eclampsia, third trimester: Secondary | ICD-10-CM | POA: Diagnosis present

## 2023-05-16 DIAGNOSIS — Z79899 Other long term (current) drug therapy: Secondary | ICD-10-CM

## 2023-05-16 DIAGNOSIS — Z91040 Latex allergy status: Secondary | ICD-10-CM | POA: Diagnosis not present

## 2023-05-16 DIAGNOSIS — F419 Anxiety disorder, unspecified: Secondary | ICD-10-CM | POA: Diagnosis present

## 2023-05-16 LAB — CBC WITH DIFFERENTIAL/PLATELET
Abs Immature Granulocytes: 0.07 10*3/uL (ref 0.00–0.07)
Basophils Absolute: 0.1 10*3/uL (ref 0.0–0.1)
Basophils Relative: 0 %
Eosinophils Absolute: 0 10*3/uL (ref 0.0–0.5)
Eosinophils Relative: 0 %
HCT: 29.5 % — ABNORMAL LOW (ref 36.0–46.0)
Hemoglobin: 9.4 g/dL — ABNORMAL LOW (ref 12.0–15.0)
Immature Granulocytes: 0 %
Lymphocytes Relative: 17 %
Lymphs Abs: 2.8 10*3/uL (ref 0.7–4.0)
MCH: 26.8 pg (ref 26.0–34.0)
MCHC: 31.9 g/dL (ref 30.0–36.0)
MCV: 84 fL (ref 80.0–100.0)
Monocytes Absolute: 0.7 10*3/uL (ref 0.1–1.0)
Monocytes Relative: 5 %
Neutro Abs: 12.6 10*3/uL — ABNORMAL HIGH (ref 1.7–7.7)
Neutrophils Relative %: 78 %
Platelets: 317 10*3/uL (ref 150–400)
RBC: 3.51 MIL/uL — ABNORMAL LOW (ref 3.87–5.11)
RDW: 17.1 % — ABNORMAL HIGH (ref 11.5–15.5)
WBC: 16.3 10*3/uL — ABNORMAL HIGH (ref 4.0–10.5)
nRBC: 0.2 % (ref 0.0–0.2)

## 2023-05-16 LAB — COMPREHENSIVE METABOLIC PANEL
ALT: 18 U/L (ref 0–44)
AST: 28 U/L (ref 15–41)
Albumin: 2.3 g/dL — ABNORMAL LOW (ref 3.5–5.0)
Alkaline Phosphatase: 152 U/L — ABNORMAL HIGH (ref 38–126)
Anion gap: 10 (ref 5–15)
BUN: 17 mg/dL (ref 6–20)
CO2: 19 mmol/L — ABNORMAL LOW (ref 22–32)
Calcium: 8.8 mg/dL — ABNORMAL LOW (ref 8.9–10.3)
Chloride: 105 mmol/L (ref 98–111)
Creatinine, Ser: 0.79 mg/dL (ref 0.44–1.00)
GFR, Estimated: >60 mL/min (ref >60)
Glucose, Bld: 152 mg/dL — ABNORMAL HIGH (ref 70–99)
Potassium: 3.7 mmol/L (ref 3.5–5.1)
Sodium: 134 mmol/L — ABNORMAL LOW (ref 135–145)
Total Bilirubin: 0.5 mg/dL (ref 0.3–1.2)
Total Protein: 6.2 g/dL — ABNORMAL LOW (ref 6.5–8.1)

## 2023-05-16 LAB — CBC
HCT: 32.4 % — ABNORMAL LOW (ref 36.0–46.0)
Hemoglobin: 10.3 g/dL — ABNORMAL LOW (ref 12.0–15.0)
MCH: 26.8 pg (ref 26.0–34.0)
MCHC: 31.8 g/dL (ref 30.0–36.0)
MCV: 84.4 fL (ref 80.0–100.0)
Platelets: 325 10*3/uL (ref 150–400)
RBC: 3.84 MIL/uL — ABNORMAL LOW (ref 3.87–5.11)
RDW: 17.1 % — ABNORMAL HIGH (ref 11.5–15.5)
WBC: 11.3 10*3/uL — ABNORMAL HIGH (ref 4.0–10.5)
nRBC: 0 % (ref 0.0–0.2)

## 2023-05-16 LAB — GLUCOSE, CAPILLARY
Glucose-Capillary: 101 mg/dL — ABNORMAL HIGH (ref 70–99)
Glucose-Capillary: 93 mg/dL (ref 70–99)
Glucose-Capillary: 69 mg/dL — ABNORMAL LOW (ref 70–99)
Glucose-Capillary: 76 mg/dL (ref 70–99)

## 2023-05-16 LAB — RPR: RPR Ser Ql: NONREACTIVE

## 2023-05-16 LAB — TYPE AND SCREEN

## 2023-05-16 MED ORDER — CALCIUM CARBONATE ANTACID 500 MG PO CHEW
CHEWABLE_TABLET | ORAL | Status: AC
  Administered 2023-05-16: 400 mg via ORAL
  Filled 2023-05-16: qty 2

## 2023-05-16 MED ORDER — TETANUS-DIPHTH-ACELL PERTUSSIS 5-2.5-18.5 LF-MCG/0.5 IM SUSY: 0.5000 mL | PREFILLED_SYRINGE | Freq: Once | INTRAMUSCULAR | Status: DC

## 2023-05-16 MED ORDER — LABETALOL HCL 5 MG/ML IV SOLN: 20.0000 mg | INTRAVENOUS | Status: DC | PRN

## 2023-05-16 MED ORDER — TERBUTALINE SULFATE 1 MG/ML IJ SOLN: 0.2500 mg | Freq: Once | INTRAMUSCULAR | Status: DC | PRN

## 2023-05-16 MED ORDER — ACETAMINOPHEN 325 MG PO TABS
650.0000 mg | ORAL_TABLET | ORAL | Status: DC | PRN
  Administered 2023-05-16 – 2023-05-17 (×2): 650 mg via ORAL
  Filled 2023-05-16 (×2): qty 2

## 2023-05-16 MED ORDER — HYDRALAZINE HCL 20 MG/ML IJ SOLN: 10.0000 mg | INTRAMUSCULAR | Status: DC | PRN

## 2023-05-16 MED ORDER — SODIUM CHLORIDE 0.9 % IV SOLN: INTRAVENOUS | Status: DC

## 2023-05-16 MED ORDER — COCONUT OIL OIL: 1.0000 | TOPICAL_OIL | Status: DC | PRN

## 2023-05-16 MED ORDER — MISOPROSTOL 200 MCG PO TABS
ORAL_TABLET | ORAL | Status: AC
  Administered 2023-05-16: 800 ug via RECTAL
  Filled 2023-05-16: qty 4

## 2023-05-16 MED ORDER — LABETALOL HCL 5 MG/ML IV SOLN
20.0000 mg | INTRAVENOUS | Status: DC | PRN
Start: 2023-05-16 — End: 2023-05-16

## 2023-05-16 MED ORDER — FENTANYL CITRATE (PF) 100 MCG/2ML IJ SOLN: 50.0000 ug | INTRAMUSCULAR | Status: DC | PRN

## 2023-05-16 MED ORDER — LABETALOL HCL 5 MG/ML IV SOLN: 40.0000 mg | INTRAVENOUS | Status: DC | PRN

## 2023-05-16 MED ORDER — EPHEDRINE 5 MG/ML INJ: 10.0000 mg | INTRAVENOUS | Status: DC | PRN

## 2023-05-16 MED ORDER — SOD CITRATE-CITRIC ACID 500-334 MG/5ML PO SOLN: 30.0000 mL | ORAL | Status: DC | PRN

## 2023-05-16 MED ORDER — LACTATED RINGERS IV SOLN
500.0000 mL | INTRAVENOUS | Status: DC | PRN
  Administered 2023-05-16: 500 mL via INTRAVENOUS

## 2023-05-16 MED ORDER — PANTOPRAZOLE SODIUM 40 MG PO TBEC
40.0000 mg | DELAYED_RELEASE_TABLET | Freq: Every day | ORAL | Status: DC
  Administered 2023-05-17 – 2023-05-18 (×2): 40 mg via ORAL
  Filled 2023-05-16 (×2): qty 1

## 2023-05-16 MED ORDER — SIMETHICONE 80 MG PO CHEW: 80.0000 mg | CHEWABLE_TABLET | ORAL | Status: DC | PRN

## 2023-05-16 MED ORDER — WITCH HAZEL-GLYCERIN EX PADS
1.0000 | MEDICATED_PAD | CUTANEOUS | Status: DC | PRN
  Filled 2023-05-16: qty 100

## 2023-05-16 MED ORDER — BENZOCAINE-MENTHOL 20-0.5 % EX AERO
1.0000 | INHALATION_SPRAY | CUTANEOUS | Status: DC | PRN
  Filled 2023-05-16: qty 56

## 2023-05-16 MED ORDER — PRENATAL MULTIVITAMIN CH
1.0000 | ORAL_TABLET | Freq: Every day | ORAL | Status: DC
  Administered 2023-05-18: 1 via ORAL
  Filled 2023-05-16: qty 1

## 2023-05-16 MED ORDER — ONDANSETRON HCL 4 MG/2ML IJ SOLN
4.0000 mg | INTRAMUSCULAR | Status: DC | PRN
  Administered 2023-05-16: 4 mg via INTRAVENOUS
  Filled 2023-05-16: qty 2

## 2023-05-16 MED ORDER — ACETAMINOPHEN 325 MG PO TABS: 650.0000 mg | ORAL_TABLET | ORAL | Status: DC | PRN

## 2023-05-16 MED ORDER — MONTELUKAST SODIUM 10 MG PO TABS
10.0000 mg | ORAL_TABLET | Freq: Every day | ORAL | Status: DC
  Administered 2023-05-16 – 2023-05-17 (×2): 10 mg via ORAL
  Filled 2023-05-16 (×3): qty 1

## 2023-05-16 MED ORDER — OXYTOCIN-SODIUM CHLORIDE 30-0.9 UT/500ML-% IV SOLN
1.0000 m[IU]/min | INTRAVENOUS | Status: DC
  Administered 2023-05-16: 2 m[IU]/min via INTRAVENOUS

## 2023-05-16 MED ORDER — DIPHENHYDRAMINE HCL 25 MG PO CAPS: 25.0000 mg | ORAL_CAPSULE | Freq: Four times a day (QID) | ORAL | Status: DC | PRN

## 2023-05-16 MED ORDER — LACTATED RINGERS IV SOLN: INTRAVENOUS | Status: DC

## 2023-05-16 MED ORDER — BUPIVACAINE HCL (PF) 0.25 % IJ SOLN
INTRAMUSCULAR | Status: DC | PRN
  Administered 2023-05-16: 8 mL via EPIDURAL

## 2023-05-16 MED ORDER — MISOPROSTOL 50MCG HALF TABLET
50.0000 ug | ORAL_TABLET | Freq: Once | ORAL | Status: AC
  Administered 2023-05-16: 50 ug via ORAL
  Filled 2023-05-16: qty 1

## 2023-05-16 MED ORDER — MISOPROSTOL 50MCG HALF TABLET
50.0000 ug | ORAL_TABLET | Freq: Once | ORAL | Status: AC
  Administered 2023-05-16: 50 ug via VAGINAL
  Filled 2023-05-16: qty 1

## 2023-05-16 MED ORDER — OXYCODONE-ACETAMINOPHEN 5-325 MG PO TABS: 2.0000 | ORAL_TABLET | ORAL | Status: DC | PRN

## 2023-05-16 MED ORDER — LABETALOL HCL 200 MG PO TABS
200.0000 mg | ORAL_TABLET | Freq: Two times a day (BID) | ORAL | Status: DC
  Administered 2023-05-16 – 2023-05-18 (×5): 200 mg via ORAL
  Filled 2023-05-16 (×3): qty 2
  Filled 2023-05-16: qty 1
  Filled 2023-05-16: qty 2

## 2023-05-16 MED ORDER — LABETALOL HCL 5 MG/ML IV SOLN: 80.0000 mg | INTRAVENOUS | Status: DC | PRN

## 2023-05-16 MED ORDER — IBUPROFEN 600 MG PO TABS
600.0000 mg | ORAL_TABLET | Freq: Four times a day (QID) | ORAL | Status: DC
  Administered 2023-05-17 – 2023-05-18 (×7): 600 mg via ORAL
  Filled 2023-05-16 (×7): qty 1

## 2023-05-16 MED ORDER — LABETALOL HCL 5 MG/ML IV SOLN
INTRAVENOUS | Status: AC
  Administered 2023-05-16: 20 mg
  Filled 2023-05-16: qty 4

## 2023-05-16 MED ORDER — ONDANSETRON HCL 4 MG/2ML IJ SOLN
4.0000 mg | Freq: Four times a day (QID) | INTRAMUSCULAR | Status: DC | PRN
  Administered 2023-05-16: 4 mg via INTRAVENOUS
  Filled 2023-05-16: qty 2

## 2023-05-16 MED ORDER — ZOLPIDEM TARTRATE 5 MG PO TABS: 5.0000 mg | ORAL_TABLET | Freq: Every evening | ORAL | Status: DC | PRN

## 2023-05-16 MED ORDER — OXYTOCIN 10 UNIT/ML IJ SOLN
INTRAMUSCULAR | Status: AC
  Filled 2023-05-16: qty 2

## 2023-05-16 MED ORDER — LIDOCAINE-EPINEPHRINE (PF) 1.5 %-1:200000 IJ SOLN
INTRAMUSCULAR | Status: DC | PRN
  Administered 2023-05-16: 3 mL via EPIDURAL

## 2023-05-16 MED ORDER — TRANEXAMIC ACID-NACL 1000-0.7 MG/100ML-% IV SOLN
INTRAVENOUS | Status: AC
  Filled 2023-05-16: qty 100

## 2023-05-16 MED ORDER — PHENYLEPHRINE 80 MCG/ML (10ML) SYRINGE FOR IV PUSH (FOR BLOOD PRESSURE SUPPORT): 80.0000 ug | PREFILLED_SYRINGE | INTRAVENOUS | Status: DC | PRN

## 2023-05-16 MED ORDER — CEFAZOLIN SODIUM-DEXTROSE 2-4 GM/100ML-% IV SOLN
2.0000 g | Freq: Once | INTRAVENOUS | Status: AC
  Administered 2023-05-16: 2 g via INTRAVENOUS
  Filled 2023-05-16: qty 100

## 2023-05-16 MED ORDER — MISOPROSTOL 50MCG HALF TABLET
50.0000 ug | ORAL_TABLET | ORAL | Status: DC | PRN
  Filled 2023-05-16: qty 1

## 2023-05-16 MED ORDER — ONDANSETRON HCL 4 MG PO TABS: 4.0000 mg | ORAL_TABLET | ORAL | Status: DC | PRN

## 2023-05-16 MED ORDER — SERTRALINE HCL 25 MG PO TABS
150.0000 mg | ORAL_TABLET | Freq: Every day | ORAL | Status: DC
  Administered 2023-05-17 – 2023-05-18 (×2): 150 mg via ORAL
  Filled 2023-05-16: qty 1
  Filled 2023-05-16: qty 2
  Filled 2023-05-16: qty 1

## 2023-05-16 MED ORDER — CALCIUM CARBONATE ANTACID 500 MG PO CHEW
2.0000 | CHEWABLE_TABLET | Freq: Three times a day (TID) | ORAL | Status: DC
  Administered 2023-05-18: 400 mg via ORAL
  Filled 2023-05-16 (×5): qty 2

## 2023-05-16 MED ORDER — MAGNESIUM SULFATE 40 GM/1000ML IV SOLN
2.0000 g/h | INTRAVENOUS | Status: DC
  Administered 2023-05-16 – 2023-05-17 (×2): 2 g/h via INTRAVENOUS
  Filled 2023-05-16 (×2): qty 1000

## 2023-05-16 MED ORDER — SENNOSIDES-DOCUSATE SODIUM 8.6-50 MG PO TABS
2.0000 | ORAL_TABLET | Freq: Every day | ORAL | Status: DC
  Administered 2023-05-17 – 2023-05-18 (×2): 2 via ORAL
  Filled 2023-05-16 (×2): qty 2

## 2023-05-16 MED ORDER — DOCUSATE SODIUM 100 MG PO CAPS
100.0000 mg | ORAL_CAPSULE | Freq: Two times a day (BID) | ORAL | Status: DC
  Administered 2023-05-17 – 2023-05-18 (×2): 100 mg via ORAL
  Filled 2023-05-16 (×2): qty 1

## 2023-05-16 MED ORDER — LABETALOL HCL 5 MG/ML IV SOLN
80.0000 mg | INTRAVENOUS | Status: DC | PRN
Start: 2023-05-16 — End: 2023-05-16

## 2023-05-16 MED ORDER — TRANEXAMIC ACID-NACL 1000-0.7 MG/100ML-% IV SOLN
1000.0000 mg | INTRAVENOUS | Status: AC
  Administered 2023-05-16: 1000 mg via INTRAVENOUS

## 2023-05-16 MED ORDER — DIPHENHYDRAMINE HCL 50 MG/ML IJ SOLN: 12.5000 mg | INTRAMUSCULAR | Status: DC | PRN

## 2023-05-16 MED ORDER — LACTATED RINGERS IV SOLN
500.0000 mL | Freq: Once | INTRAVENOUS | Status: AC
  Administered 2023-05-16: 500 mL via INTRAVENOUS

## 2023-05-16 MED ORDER — LIDOCAINE HCL (PF) 1 % IJ SOLN
INTRAMUSCULAR | Status: AC
  Filled 2023-05-16: qty 30

## 2023-05-16 MED ORDER — MAGNESIUM SULFATE BOLUS VIA INFUSION
4.0000 g | Freq: Once | INTRAVENOUS | Status: AC
  Administered 2023-05-16: 4 g via INTRAVENOUS
  Filled 2023-05-16: qty 1000

## 2023-05-16 MED ORDER — OXYTOCIN BOLUS FROM INFUSION
333.0000 mL | Freq: Once | INTRAVENOUS | Status: AC
  Administered 2023-05-16: 333 mL via INTRAVENOUS

## 2023-05-16 MED ORDER — CALCIUM GLUCONATE 10 % IV SOLN
INTRAVENOUS | Status: AC
  Filled 2023-05-16: qty 10

## 2023-05-16 MED ORDER — AMMONIA AROMATIC IN INHA
RESPIRATORY_TRACT | Status: AC
  Filled 2023-05-16: qty 10

## 2023-05-16 MED ORDER — OXYCODONE-ACETAMINOPHEN 5-325 MG PO TABS: 1.0000 | ORAL_TABLET | ORAL | Status: DC | PRN

## 2023-05-16 MED ORDER — DIBUCAINE (PERIANAL) 1 % EX OINT: 1.0000 | TOPICAL_OINTMENT | CUTANEOUS | Status: DC | PRN

## 2023-05-16 MED ORDER — OXYTOCIN-SODIUM CHLORIDE 30-0.9 UT/500ML-% IV SOLN
2.5000 [IU]/h | INTRAVENOUS | Status: DC
  Administered 2023-05-16: 2.5 [IU]/h via INTRAVENOUS
  Filled 2023-05-16 (×2): qty 500

## 2023-05-16 MED ORDER — LABETALOL HCL 5 MG/ML IV SOLN
40.0000 mg | INTRAVENOUS | Status: DC | PRN
  Administered 2023-05-16: 40 mg via INTRAVENOUS
  Filled 2023-05-16: qty 8

## 2023-05-16 MED ORDER — FENTANYL-BUPIVACAINE-NACL 0.5-0.125-0.9 MG/250ML-% EP SOLN
12.0000 mL/h | EPIDURAL | Status: DC | PRN
  Administered 2023-05-16: 12 mL/h via EPIDURAL
  Filled 2023-05-16: qty 250

## 2023-05-16 MED ORDER — HYDRALAZINE HCL 20 MG/ML IJ SOLN
10.0000 mg | INTRAMUSCULAR | Status: DC | PRN
Start: 2023-05-16 — End: 2023-05-16

## 2023-05-16 NOTE — Progress Notes (Signed)
  Labor Progress Note   32 y.o. G2P1001 @ [redacted]w[redacted]d   Subjective:  Patient is now comfortable with epidural  Objective:  BP (!) 146/89 (BP Location: Left Arm)   Pulse 96   Temp 97.7 F (36.5 C) (Oral)   Resp 16   Ht 5\' 8"  (1.727 m)   Wt 100.2 kg   SpO2 99%   BMI 33.60 kg/m  Abd: gravid SVE: 5cm/80%/-2, per RN  SROM for light mec at 1300  EFM: 140, mod variability, accels present, no decles Toco: Ucs q2-64min, Pit 59mu/min   Assessment  G2P1001 @ [redacted]w[redacted]d Early labor Mild range BP FHR Cat I   Plan:   1. Will continue to titrate pitocin as needed 2. Plan repeat SVE in 2-4hr or sooner PRN  All discussed with patient  Raeford Razor CNM, FNP [facility] 05/16/2023  1:14 PM

## 2023-05-16 NOTE — Progress Notes (Signed)
Was called by RN due to patient having severe range blood pressure. BP after first dose of IV labetalol was still severe range. When called RN was preparing to give second dose. [name] also recently started experiencing a HA. Denies visual changes or RUQ pain.  Called Dr. Feliberto Gottron to consult and recommended we start mag. He was in agreement. Orders placed for mag. Will continue to monitor blood pressure closely and treat all severe range pressures.  Plan of care discussed with patient and family who are in agreement.  Brianna Marshall CNM, FNP 9:41 PM 05/16/23

## 2023-05-16 NOTE — Progress Notes (Deleted)
Notified Doreene Burke, CNM provider of patient's request for pain control management and wanting to participate in [facility] inpatient substance abuse program for suboxone. Patient is unwilling to take any IV narcotic pain medication as she was also noted to have a clean UDS upon arrival. New orders will be placed and referral made. Will notify new nurse of above noted items.

## 2023-05-16 NOTE — Inpatient Diabetes Management (Signed)
Inpatient Diabetes Program Recommendations  Diabetes Treatment Program Recommendations  ADA Standards of Care Diabetes in Pregnancy Target Glucose Ranges:  Fasting: 70 - 95 mg/dL 1 hr postprandial: Less than 140mg /dL (from first bite of meal) 2 hr postprandial: Less than 120 mg/dL (from first bite of meal)     Latest Reference Range & Units 05/16/23 05:46  Glucose 70 - 99 mg/dL 130 (H)   Review of Glycemic Control  Diabetes history: Prediabetes; [redacted]W[redacted]D gestation Outpatient Diabetes medications: None Current orders for Inpatient glycemic control: None  Inpatient Diabetes Program Recommendations:    Insulin: Lab glucose 152 mg/dl today at 8:65 am. May want to consider ordering IV insulin drip to ensure tight glycemic control during labor.  NOTE: Patient admitted for IOL. Per chart, patient has hx of prediabetes. Patient was prescribed Metformin 1000 mg BID during pregnancy but per discharge summary on 05/14/23, Metformin was discontinued at that time.   Thanks, Orlando Penner, RN, MSN, CDCES Diabetes Coordinator Inpatient Diabetes Program [phone] (Team Pager from 8am to 5pm)

## 2023-05-16 NOTE — Progress Notes (Signed)
  Labor Progress Note   32 y.o. G2P1001 @ [redacted]w[redacted]d  IOL for mild preeclampsia, GDMA1. Received one dose of cytotec about 0600, PO and PV.  Subjective:  Reports pain 7-8 with contractions, feels it has intensified over the past hour. Would like to get an epidural now. Was up in the shower sitting on birthing ball for a little. Patient did report eating breakfast at [employer] on the way to the hospital, was likely cause of elevated blood sugar on admission. States that usually her fasting blood sugars were 90-100 and postprandial's were 120s.   Objective:  BP (!) 159/101   Pulse 98   Temp 99.1 F (37.3 C) (Oral)   Resp 16   Ht 5\' 8"  (1.727 m)   Wt 100.2 kg   BMI 33.60 kg/m  Abd: gravid SVE: 4cm/75%/-2, vertex  EFM: 145, mod variability, pos accels, no decels Toco: Ucs q2-80min  BS on admission CMP was 153, repeat just now was 101.  Assessment  G2P1001 @ [redacted]w[redacted]d Elevated blood pressure- mild range FHR Cat I Adequate cervical change   Plan:   1. Will start process to get epidural for pain management 2. Was given regularly ordered labetalol 200mg  tablet PO for blood pressure management 3. Had placed consult to diabetic coordinator due to elevated initial blood sugar, current blood sugar does not require treatment, will continue to monitor 4. After patient is comfortable with epidural with start pitocin for next step of induction  All discussed with patient  Raeford Razor CNM, FNP [facility] 05/16/2023  10:09 AM

## 2023-05-16 NOTE — Anesthesia Preprocedure Evaluation (Signed)
Anesthesia Evaluation  Patient identified by MRN, date of birth, ID band Patient awake    Reviewed: Allergy & Precautions, NPO status , Patient's Chart, lab work & pertinent test results  History of Anesthesia Complications Negative for: history of anesthetic complications  Airway Mallampati: III   Neck ROM: Full    Dental   Pulmonary asthma    Pulmonary exam normal breath sounds clear to auscultation       Cardiovascular hypertension, Normal cardiovascular exam Rhythm:Regular Rate:Normal     Neuro/Psych  Headaches PSYCHIATRIC DISORDERS  Depression    Vertigo     GI/Hepatic negative GI ROS,,,  Endo/Other  diabetes, Gestational  PCOS; obesity  Renal/GU negative Renal ROS     Musculoskeletal   Abdominal   Peds  Hematology  (+) Blood dyscrasia, anemia   Anesthesia Other Findings   Reproductive/Obstetrics                              Anesthesia Physical Anesthesia Plan  ASA: 2  Anesthesia Plan: Epidural   Post-op Pain Management:    Induction:   PONV Risk Score and Plan: 2 and Treatment may vary due to age or medical condition  Airway Management Planned: Natural Airway  Additional Equipment:   Intra-op Plan:   Post-operative Plan:   Informed Consent: I have reviewed the patients History and Physical, chart, labs and discussed the procedure including the risks, benefits and alternatives for the proposed anesthesia with the patient or authorized representative who has indicated his/her understanding and acceptance.     Dental Advisory Given  Plan Discussed with:   Anesthesia Plan Comments: (Patient reports no bleeding problems and no anticoagulant use.   Patient consented for risks of anesthesia including but not limited to:  - adverse reactions to medications - risk of bleeding, infection and or nerve damage from epidural that could lead to paralysis - risk of headache  or failed epidural - nerve damage due to positioning - that if epidural is used for C-section that there is a chance of epidural failure requiring spinal placement or conversion to GA - damage to heart, brain, lungs, other parts of body or loss of life  Patient voiced understanding.)         Anesthesia Quick Evaluation

## 2023-05-16 NOTE — Anesthesia Procedure Notes (Signed)
Epidural Patient location during procedure: OB Start time: 05/16/2023 10:45 AM End time: 05/16/2023 10:54 AM  Staffing Anesthesiologist: Reed Breech, MD Performed: anesthesiologist   Preanesthetic Checklist Completed: patient identified, IV checked, risks and benefits discussed, surgical consent, monitors and equipment checked, pre-op evaluation and timeout performed  Epidural Patient position: sitting Prep: Betadine Patient monitoring: heart rate, continuous pulse ox and blood pressure Approach: midline Location: L2-L3 Injection technique: LOR air  Needle:  Needle type: Tuohy  Needle gauge: 17 G Needle length: 9 cm Needle insertion depth: 6 cm Catheter at skin depth: 11 cm Test dose: negative and 1.5% lidocaine with Epi 1:200 K  Assessment Sensory level: T4  Additional Notes Straightforward placement without apparent complications. Reason for block:procedure for pain

## 2023-05-16 NOTE — H&P (Signed)
History and Physical   HPI  Brianna Marshall is a 32 y.o. G2P1001 at [redacted]w[redacted]d Estimated Date of Delivery: 06/03/23 by 8wk u/s, who is being admitted for induction of labor for mild preeclampsia, GDMA1 and polyhydramnios. Reports good fetal movement. Denies VB, LOF, HA, painful contractions, visual changes.   She has a hx of preeclampsia with her prior pregnancy. Was dx with mild preeclampsia based on a PC ratio of 0.36 on 04/24/23. Most recent PC ratio was 0.42 on 5/26. Has had achy pain to RUQ, although this has been present for weeks, had normal LFTs previously and told pain was likely from baby kicking her. Had u/s of abd on 4/30 for ongoing abd pain, showed slight amount of sludge, although study was WNL. Was started on labetalol 200mg  BID 5/22, had iron infusion on 5/20.   Pregnancy has been complicated by dx of anemia, preeclampsia, PCOS, obesity with BMI of 33, GDM diet controlled, depression, anxiety, migraines without aura, pre DM on NOB labs with A1c of 5.8, sleep disorder, fatigue and polyhydramnios. Pregnancy conceived with IUI.  Ultrasound on 04/28/23 showed AFI of 24.17. Growth percentile was 88.7% with AC greater than 97.7. EFW 3266g. TWG is negative 4.082kg.    OB History  OB History  Gravida Para Term Preterm AB Living  2 1 1  0 0 1  SAB IAB Ectopic Multiple Live Births  0 0 0 0 1    # Outcome Date GA Lbr Len/2nd Weight Sex Delivery Anes PTL Lv  2 Current           1 Term 07/30/18 [redacted]w[redacted]d / 01:12 4230 g M Vag-Spont EPI  LIV     Name: Godden,BOY Demeshia     Apgar1: 7  Apgar5: 9    PROBLEM LIST  Pregnancy complications or risks: Patient Active Problem List   Diagnosis Date Noted   Heartburn during pregnancy in third trimester 05/14/2023   [redacted] weeks gestation of pregnancy 05/14/2023   Elevated blood pressure affecting pregnancy in third trimester, antepartum 05/05/2023   Polyhydramnios affecting pregnancy in third trimester 04/28/2023   Uterine  contractions 04/28/2023   Labor and delivery, indication for care 04/24/2023   Nausea and vomiting in pregnancy 04/03/2023   Obesity in pregnancy 02/22/2023   History of diet controlled gestational diabetes mellitus (GDM) 02/22/2023   Supervision of high-risk pregnancy 11/16/2022   Migraine without aura and without status migrainosus, not intractable 12/17/2019   Sleep disorder 12/17/2019   Alopecia 12/17/2019   Fatigue 12/17/2019   Anxiety 08/13/2018   Preeclampsia 07/30/2018   Gestational diabetes mellitus (GDM) controlled on oral hypoglycemic drug 06/11/2018   Insulin resistance 01/02/2018   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   PCOS (polycystic ovarian syndrome) 07/08/2016   Prediabetes 07/08/2016   Vitamin D deficiency 07/08/2016    Prenatal labs and studies: ABO, Rh: --/--/O POS (05/28 0546) Antibody: NEG (05/28 0546) Rubella: 3.40 (12/01 1022) RPR: Non Reactive (03/11 1134)  HBsAg: Negative (12/01 1022)  HIV: Non Reactive (03/11 1134)  ZOX:WRUEAVWU/-- (05/17 1618) Varicella immune 1 hr GTT 169, declined 3 hr, treated as GDMA1. Repeat 1hr GTT 3/11 on 164 H&H:  Hemoglobin  Date Value Ref Range Status  05/16/2023 10.3 (L) 12.0 - 15.0 g/dL Final  98/10/9146 82.9 (L) 12.0 - 15.0 g/dL Final  56/21/3086 57.8 (L) 11.1 - 15.9 g/dL Final  46/96/2952 9.4 (L) 12.0 - 15.0 g/dL Final  84/13/2440 9.7 (L) 12.0 - 15.0 g/dL Final  10/15/2535 64.4 (L)  11.1 - 15.9 g/dL Final  16/09/9603 54.0 11.1 - 15.9 g/dL Final  98/10/9146 82.9 11.1 - 15.9 g/dL Final   GC/CT: neg/neg   Past Medical History:  Diagnosis Date   Anemia in pregnancy 05/30/2018   Antepartum mild preeclampsia, third trimester 07/30/2018   Asthma    COVID-19 02/2020   Depression    Family history of pancreatic cancer    Gestational diabetes 06/11/2018   Gestational diabetes mellitus (GDM) affecting third pregnancy 07/29/2018   Headache, migraine 07/14/2015   approx 2x/month   Hyperemesis  06/05/2018   Infertility, anovulation 03/10/2017   Nausea/vomiting in pregnancy 01/17/2018   Obesity    PCOS (polycystic ovarian syndrome) 07/08/2016   Prediabetes 07/08/2016   Hgb 5.8  04/2015    Supervision of high risk pregnancy, antepartum 01/02/2018   Clinic [facility] Prenatal Labs Dating Early [place] Blood type: O/Positive/-- (01/29 1038)  Genetic Screen 1 Screen: Neg Antibody:Negative (01/29 1038) Anatomic [place] Normal, anterior placenta,  Rubella: <0.90 (01/29 1038) Varicella: Imm GTT Early: 123 Third trimester:  RPR: Non Reactive (01/29 1038)  Rhogam n/a HBsAg: Negative (01/29 1038)  TDaP vaccine        05/30/2018    Flu Shot: 09/2017 HIV: Non Reac   Vertigo    for short time after covid   Vitamin D deficiency 07/08/2016   18.7 in 04/2015      Past Surgical History:  Procedure Laterality Date   ENDOSCOPIC CONCHA BULLOSA RESECTION Right 12/30/2021   Procedure: RIGHT CONCHA BULLOSA REDUCTION VIA NASAL ENDOSCOPY;  Surgeon: Vernie Murders, MD;  Location: [facility];  Service: ENT;  Laterality: Right;   INCISE AND DRAIN ABCESS Right 2011   Posterior Thigh   sebaceous cyst removal  07/2017   Back of neck   SEPTOPLASTY N/A 12/30/2021   Procedure: SEPTOPLASTY;  Surgeon: Vernie Murders, MD;  Location: [facility];  Service: ENT;  Laterality: N/A;   TURBINATE REDUCTION Right 12/30/2021   Procedure: PARTIAL EXCISION RIGHT INFERIOR TURBINATE;  Surgeon: Vernie Murders, MD;  Location: [facility];  Service: ENT;  Laterality: Right;   WISDOM TOOTH EXTRACTION       Medications    Current Discharge Medication List     CONTINUE these medications which have NOT CHANGED   Details  Acetaminophen (TYLENOL PO) Take by mouth as needed.    aspirin EC 81 MG tablet Take 1 tablet (81 mg total) by mouth daily. Swallow whole. Qty: 30 tablet, Refills: 12    Continuous Blood Gluc Sensor (FREESTYLE LIBRE 3 SENSOR) MISC 1 patch by Does not apply route every 14 (fourteen) days. Place 1  sensor on the skin every 14 days. Use to check glucose continuously Qty: 2 each, Refills: 8   Associated Diagnoses: GDM, class A1    doxylamine, Sleep, (UNISOM) 25 MG tablet Take 25 mg by mouth at bedtime as needed (nauea).    folic acid (FOLVITE) 400 MCG tablet Take 400 mcg by mouth daily.    labetalol (NORMODYNE) 200 MG tablet Take 1 tablet (200 mg total) by mouth 2 (two) times daily. Qty: 60 tablet, Refills: 0    montelukast (SINGULAIR) 10 MG tablet Take 1 tablet (10 mg total) by mouth at bedtime. Qty: 90 tablet, Refills: 1   Associated Diagnoses: Environmental and seasonal allergies    omeprazole (PRILOSEC) 20 MG capsule Take 20 mg by mouth daily.    ondansetron (ZOFRAN-ODT) 4 MG disintegrating tablet Take 1 tablet (4 mg total) by mouth every 6 (six) hours as  needed for nausea. Qty: 20 tablet, Refills: 2   Associated Diagnoses: Nausea and vomiting during pregnancy    pyridOXINE (VITAMIN B6) 50 MG tablet Take 50 mg by mouth daily.    sertraline (ZOLOFT) 100 MG tablet Take 1.5 tablets (150 mg total) by mouth daily. Qty: 45 tablet, Refills: 2   Associated Diagnoses: Postpartum depression; Recurrent major depressive disorder, in partial remission (HCC)         Allergies  Shellfish allergy, Latex, Amoxicillin, and Lidocaine  Review of Systems  Pertinent items are noted in HPI.  Physical Exam  BP 128/80 (BP Location: Left Arm)   Pulse 98   Temp 99.1 F (37.3 C) (Oral)   Resp 16   Ht 5\' 8"  (1.727 m)   Wt 100.2 kg   BMI 33.60 kg/m   Lungs:  CTA B Cardio: S1S2, RRR Abd: Soft, gravid, NT Presentation: cephalic DTRs: 2+ B SVE: 2cm/50%/ballotable  FHR 145, mod variability, pos accels, no decels Toco no contractions detected   Test Results  Results for orders placed or performed during the hospital encounter of 05/16/23 (from the past 24 hour(s))  CBC     Status: Abnormal   Collection Time: 05/16/23  5:46 AM  Result Value Ref Range   WBC 11.3 (H) 4.0 - 10.5  K/uL   RBC 3.84 (L) 3.87 - 5.11 MIL/uL   Hemoglobin 10.3 (L) 12.0 - 15.0 g/dL   HCT 02.7 (L) 25.3 - 66.4 %   MCV 84.4 80.0 - 100.0 fL   MCH 26.8 26.0 - 34.0 pg   MCHC 31.8 30.0 - 36.0 g/dL   RDW 40.3 (H) 47.4 - 25.9 %   Platelets 325 150 - 400 K/uL   nRBC 0.0 0.0 - 0.2 %  Type and screen [facility]     Status: None   Collection Time: 05/16/23  5:46 AM  Result Value Ref Range   ABO/RH(D) O POS    Antibody Screen NEG    Sample Expiration      05/19/2023,2359 Performed at [facility], [address] [place], [place] [address]   Comprehensive metabolic panel     Status: Abnormal   Collection Time: 05/16/23  5:46 AM  Result Value Ref Range   Sodium 134 (L) 135 - 145 mmol/L   Potassium 3.7 3.5 - 5.1 mmol/L   Chloride 105 98 - 111 mmol/L   CO2 19 (L) 22 - 32 mmol/L   Glucose, Bld 152 (H) 70 - 99 mg/dL   BUN 17 6 - 20 mg/dL   Creatinine, Ser 5.64 0.44 - 1.00 mg/dL   Calcium 8.8 (L) 8.9 - 10.3 mg/dL   Total Protein 6.2 (L) 6.5 - 8.1 g/dL   Albumin 2.3 (L) 3.5 - 5.0 g/dL   AST 28 15 - 41 U/L   ALT 18 0 - 44 U/L   Alkaline Phosphatase 152 (H) 38 - 126 U/L   Total Bilirubin 0.5 0.3 - 1.2 mg/dL   GFR, Estimated >33 >29 mL/min   Anion gap 10 5 - 15   Group B Strep negative  Assessment  G2P1001 at [redacted]w[redacted]d Estimated Date of Delivery: 06/03/23  RNST Mild preeclampsia GDMA1 GBS neg  Patient Active Problem List   Diagnosis Date Noted   Heartburn during pregnancy in third trimester 05/14/2023   [redacted] weeks gestation of pregnancy 05/14/2023   Elevated blood pressure affecting pregnancy in third trimester, antepartum 05/05/2023   Polyhydramnios affecting pregnancy in third trimester 04/28/2023   Uterine contractions  04/28/2023   Labor and delivery, indication for care 04/24/2023   Nausea and vomiting in pregnancy 04/03/2023   Obesity in pregnancy 02/22/2023   History of diet controlled gestational diabetes mellitus (GDM) 02/22/2023   Supervision of  high-risk pregnancy 11/16/2022   Migraine without aura and without status migrainosus, not intractable 12/17/2019   Sleep disorder 12/17/2019   Alopecia 12/17/2019   Fatigue 12/17/2019   Anxiety 08/13/2018   Preeclampsia 07/30/2018   Gestational diabetes mellitus (GDM) controlled on oral hypoglycemic drug 06/11/2018   Insulin resistance 01/02/2018   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   PCOS (polycystic ovarian syndrome) 07/08/2016   Prediabetes 07/08/2016   Vitamin D deficiency 07/08/2016    Plan  1. Admit to L&D   2. EFM per unit policy 3. Labs : T&S, CBC, RPR, CMP by off going CNM 4. MD notified of patient admission and status  5. IOL started with cytotec PO and PV  Raeford Razor, [place], [place] 05/16/2023 8:48 AM

## 2023-05-16 NOTE — Progress Notes (Signed)
Patient ID: Brianna Marshall, female   DOB: May 05, 1991, 32 y.o.   MRN: [id number] I have been updated on the pts progress . Currently clinically stable and H/H stable

## 2023-05-16 NOTE — Discharge Summary (Signed)
OB Discharge Summary     Patient Name: Brianna Marshall DOB: [birth year] MRN: [id number]  Date of admission: 05/16/2023 Delivering MD: Mirna Mires, CNM  Date of Delivery: 05/18/2023  Date of discharge: 05/18/2023  Admitting diagnosis: Labor and delivery, indication for care [O75.9] Intrauterine pregnancy: [redacted]w[redacted]d     Secondary diagnosis: Preeclampsia, Gestational Diabetes diet controlled (A1), Anemia, and polyhydramnios     Discharge diagnosis: Term Pregnancy Delivered                                                                                                Post partum procedures:blood transfusion  Augmentation: Pitocin and Cytotec  Complications: Hemorrhage>1062mL  Hospital course:  Induction of Labor With Vaginal Delivery   32 y.o. yo G2P1001 at [redacted]w[redacted]d was admitted to the hospital 05/16/2023 for induction of labor.  Indication for induction: Preeclampsia, A1 DM, and polyhydramnios .  Patient had an labor course complicated by PPH. Membrane Rupture Time/Date: 1:01 PM ,05/16/2023   Delivery Method:Vaginal, Spontaneous  Episiotomy: None  Lacerations:  2nd degree;1st degree;Labial;Vaginal   Details of delivery can be found in separate delivery note.  Patient had a postpartum course complicated by a low Hgb and Hct that was addressed with infusion of two units of RBCs. Elevated blood pressures have also been treated with magnesium sulphate and po antihypertensives.. Patient is discharged home 05/18/23.  Newborn Data: Birth date:05/16/2023  Birth time:4:32 PM  Gender:Female  Living status:Living  Apgars:7 ,8  Weight:3380 g   Subjective:   Physical exam  Vitals:   05/18/23 1554 05/18/23 1742 05/18/23 1743 05/18/23 1920  BP: (!) 132/90  135/80 (!) 141/86  Pulse: 90 100 94 91  Resp: 20 20  16   Temp: 98.5 F (36.9 C) 99.2 F (37.3 C)  98 F (36.7 C)  TempSrc: Oral Oral  Oral  SpO2: 99% 100%  100%  Weight:      Height:       General: alert,  cooperative, and no distress Breast: soft, non-tender, nipples without breakdown Lochia: appropriate Uterine Fundus: firm Perineum: no erythema or foul odor discharge, minimal edema DVT Evaluation: No evidence of DVT seen on physical exam. Negative Homan's sign. No cords or calf tenderness. No significant calf/ankle edema.  Labs: Lab Results  Component Value Date   WBC 11.4 (H) 05/18/2023   HGB 9.6 (L) 05/18/2023   HCT 29.9 (L) 05/18/2023   MCV 84.7 05/18/2023   PLT 331 05/18/2023    Discharge instruction: in After Visit Summary.  Medications:  Allergies as of 05/18/2023       Reactions   Shellfish Allergy Nausea And Vomiting, Nausea Only   Latex Dermatitis   Amoxicillin Other (See Comments), Rash   Lidocaine Rash   topical        Medication List     STOP taking these medications    aspirin EC 81 MG tablet   doxylamine (Sleep) 25 MG tablet Commonly known as: UNISOM   folic acid 400 MCG tablet Commonly known as: Buyer, retail 3 Sensor Misc   montelukast 10 MG tablet  Commonly known as: SINGULAIR   pyridOXINE 50 MG tablet Commonly known as: VITAMIN B6   TYLENOL PO       TAKE these medications    ibuprofen 600 MG tablet Commonly known as: ADVIL Take 1 tablet (600 mg total) by mouth every 6 (six) hours. Start taking on: May 19, 2023   labetalol 200 MG tablet Commonly known as: NORMODYNE Take 1 tablet (200 mg total) by mouth 2 (two) times daily.   omeprazole 20 MG capsule Commonly known as: PRILOSEC Take 20 mg by mouth daily.   ondansetron 4 MG disintegrating tablet Commonly known as: ZOFRAN-ODT Take 1 tablet (4 mg total) by mouth every 6 (six) hours as needed for nausea.   sertraline 100 MG tablet Commonly known as: ZOLOFT Take 1.5 tablets (150 mg total) by mouth daily.         Activity: Advance as tolerated. Pelvic rest for 6 weeks.   Outpatient follow up:  Follow-up Information     [facility] at  [facility]. Schedule an appointment as soon as possible for a visit in 1 week(s).   Specialty: Obstetrics and Gynecology Why: call to schedule a one week blood pressure check/mood check  and six week in person visit Contact information: [address] [place] [address] [phone]                  Postpartum contraception: Combination OCPs Rhogam Given postpartum: not indicated Rubella vaccine given postpartum: not indicated Varicella vaccine given postpartum: not indicated TDaP given antepartum or postpartum: received 03/14/23  Newborn Data: Live born female  Birth Weight: 7 lb 7.2 oz (3380 g) APGAR: 7, 8  Newborn Delivery   Birth date/time: 05/16/2023 16:32:00 Delivery type: Vaginal, Spontaneous       Baby Feeding: Bottle  Disposition:home with mother  Mirna Mires, [facility]  05/18/2023 8:24 PM

## 2023-05-17 ENCOUNTER — Encounter: Payer: Self-pay | Admitting: Certified Nurse Midwife

## 2023-05-17 LAB — CBC
HCT: 27.9 % — ABNORMAL LOW (ref 36.0–46.0)
Hemoglobin: 9 g/dL — ABNORMAL LOW (ref 12.0–15.0)
MCH: 26.9 pg (ref 26.0–34.0)
MCHC: 32.3 g/dL (ref 30.0–36.0)
MCV: 83.5 fL (ref 80.0–100.0)
Platelets: 294 10*3/uL (ref 150–400)
RBC: 3.34 MIL/uL — ABNORMAL LOW (ref 3.87–5.11)
RDW: 17.2 % — ABNORMAL HIGH (ref 11.5–15.5)
WBC: 14.1 10*3/uL — ABNORMAL HIGH (ref 4.0–10.5)
nRBC: 0 % (ref 0.0–0.2)

## 2023-05-17 NOTE — Progress Notes (Signed)
Magnesium sulfate infusion complete, blood pressures normotensive, single mild range earlier today, no severe range. Adequate urine output. Brianna Marshall feeling weak but overall much better. Discontinue foley catheter. Continue to monitor blood pressure overnight. AM CBC ordered.

## 2023-05-17 NOTE — Progress Notes (Signed)
Subjective:   Brianna Marshall had a NSVB on 05/16/23. She was induced for pre-eclampsia without severe features at term. Magnesium started after delivery for severe range BP requiring IV Labetalol  Pt. Is eating, hydrating, Foley catheter in place to closely monitor urine output. Reports mild/moderate vaginal bleeding, denies passing large blood clots. Has had cramping abdomen pain relieved with tylenol/ibuprofen. Bottle feeding infant daughter Wilson Singer. Endorses good support from partner and family.   Objective:  Vital signs in last 24 hours: Temp:  [97.7 F (36.5 C)-100 F (37.8 C)] 98.1 F (36.7 C) (05/29 0710) Pulse Rate:  [73-109] 81 (05/29 0930) Resp:  [16] 16 (05/29 0930) BP: (108-174)/(63-116) 108/82 (05/29 0930) SpO2:  [95 %-100 %] 98 % (05/29 0930)    General: NAD Pulmonary: no increased work of breathing, CTAB Cardiovascular: RRR, no murmur Neuro: DTRs 2-3+ Abdomen: soft, non-tender Fundus: firm, midline, at umbilicus Lochia: light rubra, no clots Perineum: no erythema or foul odor discharge, minimal edema, laceration well approximated  Extremities: no edema, no erythema, no tenderness. SCDs in place  Results for orders placed or performed during the hospital encounter of 05/16/23 (from the past 72 hour(s))  CBC     Status: Abnormal   Collection Time: 05/16/23  5:46 AM  Result Value Ref Range   WBC 11.3 (H) 4.0 - 10.5 K/uL   RBC 3.84 (L) 3.87 - 5.11 MIL/uL   Hemoglobin 10.3 (L) 12.0 - 15.0 g/dL   HCT 29.5 (L) 28.4 - 13.2 %   MCV 84.4 80.0 - 100.0 fL   MCH 26.8 26.0 - 34.0 pg   MCHC 31.8 30.0 - 36.0 g/dL   RDW 44.0 (H) 10.2 - 72.5 %   Platelets 325 150 - 400 K/uL   nRBC 0.0 0.0 - 0.2 %    Comment: Performed at [facility], [address] [place], [place] [address]  Type and screen [facility]     Status: None   Collection Time: 05/16/23  5:46 AM  Result Value Ref Range   ABO/RH(D) O POS    Antibody Screen NEG     Sample Expiration      05/19/2023,2359 Performed at [facility], [address] [place], [place] [address]   RPR     Status: None   Collection Time: 05/16/23  5:46 AM  Result Value Ref Range   RPR Ser Ql NON REACTIVE NON REACTIVE    Comment: Performed at [facility], [address] [place], [place] [address]  Comprehensive metabolic panel     Status: Abnormal   Collection Time: 05/16/23  5:46 AM  Result Value Ref Range   Sodium 134 (L) 135 - 145 mmol/L   Potassium 3.7 3.5 - 5.1 mmol/L   Chloride 105 98 - 111 mmol/L   CO2 19 (L) 22 - 32 mmol/L   Glucose, Bld 152 (H) 70 - 99 mg/dL    Comment: Glucose reference range applies only to samples taken after fasting for at least 8 hours.   BUN 17 6 - 20 mg/dL   Creatinine, Ser 3.87 0.44 - 1.00 mg/dL   Calcium 8.8 (L) 8.9 - 10.3 mg/dL   Total Protein 6.2 (L) 6.5 - 8.1 g/dL   Albumin 2.3 (L) 3.5 - 5.0 g/dL   AST 28 15 - 41 U/L   ALT 18 0 - 44 U/L   Alkaline Phosphatase 152 (H) 38 - 126 U/L   Total Bilirubin 0.5 0.3 - 1.2 mg/dL  GFR, Estimated >60 >60 mL/min    Comment: (NOTE) Calculated using the CKD-EPI Creatinine Equation (2021)    Anion gap 10 5 - 15    Comment: Performed at [facility], [address] [place], [place] [address]  Glucose, capillary     Status: Abnormal   Collection Time: 05/16/23 10:06 AM  Result Value Ref Range   Glucose-Capillary 101 (H) 70 - 99 mg/dL    Comment: Glucose reference range applies only to samples taken after fasting for at least 8 hours.  Glucose, capillary     Status: None   Collection Time: 05/16/23  2:05 PM  Result Value Ref Range   Glucose-Capillary 93 70 - 99 mg/dL    Comment: Glucose reference range applies only to samples taken after fasting for at least 8 hours.  Glucose, capillary     Status: Abnormal   Collection Time: 05/16/23  6:38 PM  Result Value Ref Range   Glucose-Capillary 69 (L) 70 - 99 mg/dL    Comment: Glucose reference range applies only  to samples taken after fasting for at least 8 hours.  CBC with Differential/Platelet     Status: Abnormal   Collection Time: 05/16/23  6:45 PM  Result Value Ref Range   WBC 16.3 (H) 4.0 - 10.5 K/uL   RBC 3.51 (L) 3.87 - 5.11 MIL/uL   Hemoglobin 9.4 (L) 12.0 - 15.0 g/dL   HCT 60.4 (L) 54.0 - 98.1 %   MCV 84.0 80.0 - 100.0 fL   MCH 26.8 26.0 - 34.0 pg   MCHC 31.9 30.0 - 36.0 g/dL   RDW 19.1 (H) 47.8 - 29.5 %   Platelets 317 150 - 400 K/uL   nRBC 0.2 0.0 - 0.2 %   Neutrophils Relative % 78 %   Neutro Abs 12.6 (H) 1.7 - 7.7 K/uL   Lymphocytes Relative 17 %   Lymphs Abs 2.8 0.7 - 4.0 K/uL   Monocytes Relative 5 %   Monocytes Absolute 0.7 0.1 - 1.0 K/uL   Eosinophils Relative 0 %   Eosinophils Absolute 0.0 0.0 - 0.5 K/uL   Basophils Relative 0 %   Basophils Absolute 0.1 0.0 - 0.1 K/uL   Immature Granulocytes 0 %   Abs Immature Granulocytes 0.07 0.00 - 0.07 K/uL    Comment: Performed at [facility], [address] [place], [place] [address]  Glucose, capillary     Status: None   Collection Time: 05/16/23  6:54 PM  Result Value Ref Range   Glucose-Capillary 76 70 - 99 mg/dL    Comment: Glucose reference range applies only to samples taken after fasting for at least 8 hours.  CBC     Status: Abnormal   Collection Time: 05/17/23  7:32 AM  Result Value Ref Range   WBC 14.1 (H) 4.0 - 10.5 K/uL   RBC 3.34 (L) 3.87 - 5.11 MIL/uL   Hemoglobin 9.0 (L) 12.0 - 15.0 g/dL   HCT 86.5 (L) 78.4 - 69.6 %   MCV 83.5 80.0 - 100.0 fL   MCH 26.9 26.0 - 34.0 pg   MCHC 32.3 30.0 - 36.0 g/dL   RDW 29.5 (H) 28.4 - 13.2 %   Platelets 294 150 - 400 K/uL   nRBC 0.0 0.0 - 0.2 %    Comment: Performed at [facility], [address], [place], [place] [address]    Assessment:   32 y.o. [phone] 1 day(s)  s/p NSVB Pre-eclampsia with severe features by BP Anemia  secondary to acute blood loss- hemodynamically stable VSS, BP currently normotensive Pain well controlled A1GDM  Plan:     PO Fe, recheck AM CBC Blood Type --/--/O POS (05/28 0546) / Rubella 3.40 (12/01 1022) / Varicella Immune Rhogam not indicated Tdap/varicella/rubella to be offered before discharge if indicated Feeding plan bottle Continue magnesium x24h postpartum. Monitor urine output. AM fasting CBG ordered    Dominica Severin, CNM  OB/GYN 05/17/2023, 10:26 AM

## 2023-05-17 NOTE — Anesthesia Postprocedure Evaluation (Signed)
Anesthesia Post Note  Patient: Brianna Marshall  Procedure(s) Performed: AN AD HOC LABOR EPIDURAL  Patient location during evaluation: Mother Baby Anesthesia Type: Epidural Level of consciousness: awake and alert Pain management: pain level controlled Vital Signs Assessment: post-procedure vital signs reviewed and stable Respiratory status: spontaneous breathing, nonlabored ventilation and respiratory function stable Cardiovascular status: stable Postop Assessment: no headache, no backache and epidural receding Anesthetic complications: no  No notable events documented.   Last Vitals:  Vitals:   05/17/23 0705 05/17/23 0710  BP:  (!) 147/92  Pulse:  78  Resp:  16  Temp:  36.7 C  SpO2: 98% 100%    Last Pain:  Vitals:   05/17/23 0710  TempSrc: Oral  PainSc:                  Elmarie Mainland

## 2023-05-18 LAB — CBC
HCT: 24.3 % — ABNORMAL LOW (ref 36.0–46.0)
Hemoglobin: 7.6 g/dL — ABNORMAL LOW (ref 12.0–15.0)
MCH: 26.5 pg (ref 26.0–34.0)
MCHC: 31.3 g/dL (ref 30.0–36.0)
MCV: 84.7 fL (ref 80.0–100.0)
Platelets: 331 10*3/uL (ref 150–400)
RBC: 2.87 MIL/uL — ABNORMAL LOW (ref 3.87–5.11)
RDW: 17.9 % — ABNORMAL HIGH (ref 11.5–15.5)
WBC: 11.4 10*3/uL — ABNORMAL HIGH (ref 4.0–10.5)
nRBC: 0 % (ref 0.0–0.2)

## 2023-05-18 LAB — BPAM RBC
ISSUE DATE / TIME: 202405301255
ISSUE DATE / TIME: 202405301531
Unit Type and Rh: 5100

## 2023-05-18 LAB — PREPARE RBC (CROSSMATCH)

## 2023-05-18 LAB — GLUCOSE, CAPILLARY: Glucose-Capillary: 80 mg/dL (ref 70–99)

## 2023-05-18 LAB — HEMOGLOBIN AND HEMATOCRIT, BLOOD
HCT: 29.9 % — ABNORMAL LOW (ref 36.0–46.0)
Hemoglobin: 9.6 g/dL — ABNORMAL LOW (ref 12.0–15.0)

## 2023-05-18 LAB — TYPE AND SCREEN
ABO/RH(D): O POS
Antibody Screen: NEGATIVE

## 2023-05-18 MED ORDER — SODIUM CHLORIDE 0.9% IV SOLUTION: Freq: Once | INTRAVENOUS | Status: AC

## 2023-05-18 MED ORDER — LABETALOL HCL 200 MG PO TABS: 200.0000 mg | ORAL_TABLET | Freq: Two times a day (BID) | ORAL | 3 refills | Status: DC

## 2023-05-18 MED ORDER — IBUPROFEN 600 MG PO TABS: 600.0000 mg | ORAL_TABLET | Freq: Four times a day (QID) | ORAL | 0 refills | Status: DC

## 2023-05-18 NOTE — Progress Notes (Signed)
Patient discharged home with family.  Discharge instructions, when to follow up, and prescriptions reviewed with patient.  Patient verbalized understanding. Patient will be escorted out by RN for discharge.

## 2023-05-18 NOTE — Discharge Instructions (Signed)

## 2023-05-18 NOTE — Progress Notes (Signed)
Post Partum Day 2 Subjective: no complaints, up ad lib, voiding, tolerating PO, and and admits to feeling dizzy when she gets up. A  bit lightheaded.  She denies any headaches, or blurred vision.  Objective: Blood pressure 117/65, pulse 97, temperature 98.1 F (36.7 C), temperature source Oral, resp. rate 20, height 5\' 8"  (1.727 m), weight 100.2 kg, SpO2 100 %, unknown if currently breastfeeding.  Physical Exam:  General: cooperative, fatigued, no distress, and pale Lochia: appropriate Uterine Fundus: firm Incision: healing well DVT Evaluation: No evidence of DVT seen on physical exam. Negative Homan's sign. No cords or calf tenderness.  Recent Labs    05/17/23 0732 05/18/23 0629  HGB 9.0* 7.6*  HCT 27.9* 24.3*    Assessment/Plan: Plan for discharge tomorrow and Discharge homeS?P magnesium sulphate  for 24 hours due to pre eclampsia. Low iron- recommend either iron infusion or a unit of RBCs. Will consutl with Dr. Valentino Saxon and make plans for infusion prior to her discharge later today.   LOS: 2 days   Mirna Mires, CNM 05/18/2023, 10:57 AM

## 2023-05-19 LAB — BPAM RBC
Blood Product Expiration Date: 202406302359
Blood Product Expiration Date: 202406302359
Unit Type and Rh: 5100

## 2023-05-19 LAB — TYPE AND SCREEN
Unit division: 0
Unit division: 0

## 2023-05-23 ENCOUNTER — Other Ambulatory Visit: Payer: Self-pay | Admitting: Internal Medicine

## 2023-05-23 DIAGNOSIS — J3089 Other allergic rhinitis: Secondary | ICD-10-CM

## 2023-05-23 LAB — PROTEIN / CREATININE RATIO, URINE

## 2023-05-24 NOTE — Telephone Encounter (Signed)
Unable to refill per protocol, Rx expired. Discontinued 05/18/23.  Requested Prescriptions  Pending Prescriptions Disp Refills   montelukast (SINGULAIR) 10 MG tablet [Pharmacy Med Name: MONTELUKAST 10MG  TABLETS] 90 tablet 1    Sig: TAKE 1 TABLET(10 MG) BY MOUTH AT BEDTIME     Pulmonology:  Leukotriene Inhibitors Passed - 05/23/2023 10:06 PM      Passed - Valid encounter within last 12 months    Recent Outpatient Visits           2 months ago Pharyngitis due to Streptococcus species   [place] [facility] at [facility], Nyoka Cowden, MD   6 months ago Eustachian tube dysfunction, bilateral   [place] [facility] at [facility], Nyoka Cowden, MD   11 months ago Pharyngitis, unspecified etiology   [place] [facility] at [facility], Nyoka Cowden, MD   1 year ago Recurrent major depressive disorder, in partial remission [facility])   [place] [facility] at [facility], Nyoka Cowden, MD   1 year ago Acute non-recurrent maxillary sinusitis   [place] [facility] at [facility], Nyoka Cowden, MD       Future Appointments             In 1 week Mirna Mires, CNM [facility] [place] [facility]

## 2023-05-25 ENCOUNTER — Ambulatory Visit (INDEPENDENT_AMBULATORY_CARE_PROVIDER_SITE_OTHER): Payer: BC Managed Care – PPO

## 2023-05-25 VITALS — BP 118/83 | HR 85 | Ht 68.0 in | Wt 192.9 lb

## 2023-05-25 DIAGNOSIS — Z0130 Encounter for examination of blood pressure without abnormal findings: Secondary | ICD-10-CM

## 2023-05-25 NOTE — Progress Notes (Signed)
    NURSE VISIT NOTE  Subjective:    Patient ID: Brianna Marshall, female    DOB: 1991/04/29, 32 y.o.   MRN: [id number]  HPI  Patient is a 32 y.o. [id number] female who presents for BP check per order from Paula Compton, CNM.   Patient reports compliance with prescribed BP medications: yes Labetalol 200 mgtwice daily Last dose of BP medication:  200 mg at 8:45 am  She states occasional headaches but believes they are due to lack of sleep.   BP Readings from Last 3 Encounters:  05/18/23 (!) 141/86  05/14/23 (!) 141/85  05/12/23 (!) 133/90   Pulse Readings from Last 3 Encounters:  05/18/23 91  05/14/23 (!) 101  05/12/23 (!) 121    Objective:    Ht 5\' 8"  (1.727 m)   Wt 192 lb 14.4 oz (87.5 kg)   BMI 29.33 kg/m   Assessment:   1. BP check      Plan:   Per Paula Compton, CNM:  Continue current treatment regimen. Continue to monitor blood pressure at home. Report any reading >140/90 or with any associated symptoms. Return to clinic as scheduled.  Patient verbalized understanding of instructions.   Loman Chroman, CMA

## 2023-05-29 ENCOUNTER — Encounter: Payer: Self-pay | Admitting: Obstetrics

## 2023-05-29 ENCOUNTER — Ambulatory Visit (INDEPENDENT_AMBULATORY_CARE_PROVIDER_SITE_OTHER): Payer: BC Managed Care – PPO | Admitting: Obstetrics

## 2023-05-29 DIAGNOSIS — Z0130 Encounter for examination of blood pressure without abnormal findings: Secondary | ICD-10-CM

## 2023-05-29 DIAGNOSIS — O0993 Supervision of high risk pregnancy, unspecified, third trimester: Secondary | ICD-10-CM

## 2023-05-29 NOTE — Progress Notes (Signed)
Postpartum Visit  Chief Complaint:  Chief Complaint  Patient presents with   Wound Check    History of Present Illness: Patient is a 32 y.o. [id number] presents for postpartum visit.she delivered vaginally  14 days ago. Her pregnancy was high risk, with Pre eclampsia and GDM. She also had a Ppp bleed, and received two units of RBCs prior to discharge. Today she shares that her perineum is very sore, and she has seen some exposed sutures. She denies any headache or visual changes. Take zoloft 150 mg po daily for mood issues, and is followed by a psychiatrist with whom she has an appointment later this month.  After she went home from [facility], she had several episodes of completely forgetting who her family members were, and not knowing why she had a baby in her arms, for example. This is not curently happening. She is continuing on daily Labetalol.    Date of delivery: 05/18/2023 Type of delivery: Vaginal delivery - Vacuum or forceps assisted  no Episiotomy No.  Laceration: yes, both labial lacerations and bilateral perineal Pregnancy or labor problems:  Pre eclampsia and GDM, Any problems since the delivery:  she has continued on labetalol for her blood pressure and has had perineal pain from her stitches. She also reports earlier episodes of not knowing who she was with ( in her family, or not recognizing that her baby was hers) this has not happened over the last 5 days or sol.  Newborn Details:  SINGLETON :  1. Baby's name: girl. Birth weight: 3380 gms Maternal Details:  Breast Feeding:  yes Post partum depression/anxiety noted:  yes Edinburgh Post-Partum Depression Score:  11    Past Medical History:  Diagnosis Date   Anemia in pregnancy 05/30/2018   Antepartum mild preeclampsia, third trimester 07/30/2018   Asthma    COVID-19 02/2020   Depression    Elevated blood pressure affecting pregnancy in third trimester, antepartum 05/05/2023   Family history of pancreatic cancer     Gestational diabetes 06/11/2018   Gestational diabetes mellitus (GDM) affecting third pregnancy 07/29/2018   Headache, migraine 07/14/2015   approx 2x/month   Hyperemesis 06/05/2018   Infertility, anovulation 03/10/2017   Nausea/vomiting in pregnancy 01/17/2018   Obesity    PCOS (polycystic ovarian syndrome) 07/08/2016   Prediabetes 07/08/2016   Hgb 5.8  04/2015    Supervision of high risk pregnancy, antepartum 01/02/2018   Clinic [facility] Prenatal Labs Dating Early [place] Blood type: O/Positive/-- (01/29 1038)  Genetic Screen 1 Screen: Neg Antibody:Negative (01/29 1038) Anatomic [place] Normal, anterior placenta,  Rubella: <0.90 (01/29 1038) Varicella: Imm GTT Early: 123 Third trimester:  RPR: Non Reactive (01/29 1038)  Rhogam n/a HBsAg: Negative (01/29 1038)  TDaP vaccine        05/30/2018    Flu Shot: 09/2017 HIV: Non Reac   Vertigo    for short time after covid   Vitamin D deficiency 07/08/2016   18.7 in 04/2015     Past Surgical History:  Procedure Laterality Date   ENDOSCOPIC CONCHA BULLOSA RESECTION Right 12/30/2021   Procedure: RIGHT CONCHA BULLOSA REDUCTION VIA NASAL ENDOSCOPY;  Surgeon: Vernie Murders, MD;  Location: [facility];  Service: ENT;  Laterality: Right;   INCISE AND DRAIN ABCESS Right 2011   Posterior Thigh   sebaceous cyst removal  07/2017   Back of neck   SEPTOPLASTY N/A 12/30/2021   Procedure: SEPTOPLASTY;  Surgeon: Vernie Murders, MD;  Location: [facility];  Service: ENT;  Laterality: N/A;  TURBINATE REDUCTION Right 12/30/2021   Procedure: PARTIAL EXCISION RIGHT INFERIOR TURBINATE;  Surgeon: Vernie Murders, MD;  Location: [facility];  Service: ENT;  Laterality: Right;   WISDOM TOOTH EXTRACTION      Prior to Admission medications   Medication Sig Start Date End Date Taking? Authorizing Provider  ibuprofen (ADVIL) 600 MG tablet Take 1 tablet (600 mg total) by mouth every 6 (six) hours. 05/19/23  Yes Mirna Mires, CNM  labetalol (NORMODYNE) 200  MG tablet Take 1 tablet (200 mg total) by mouth 2 (two) times daily. 05/18/23  Yes Mirna Mires, CNM  loratadine (CLARITIN REDITABS) 10 MG dissolvable tablet Take 10 mg by mouth daily.   Yes [provider]  sertraline (ZOLOFT) 100 MG tablet Take 1.5 tablets (150 mg total) by mouth daily. 04/14/23  Yes Linzie Collin, MD  omeprazole (PRILOSEC) 20 MG capsule Take 20 mg by mouth daily. Patient not taking: Reported on 05/29/2023 04/24/23   [provider]  ondansetron (ZOFRAN-ODT) 4 MG disintegrating tablet Take 1 tablet (4 mg total) by mouth every 6 (six) hours as needed for nausea. Patient not taking: Reported on 05/29/2023 04/28/23   Tresea Mall, CNM    Allergies  Allergen Reactions   Shellfish Allergy Nausea And Vomiting and Nausea Only   Latex Dermatitis   Amoxicillin Other (See Comments) and Rash   Lidocaine Rash    topical     Social History   Socioeconomic History   Marital status: Married    Spouse name: Denyse Amass   Number of children: 1   Years of education: 12   Highest education level: High school graduate  Occupational History   Occupation: Printmaker  Tobacco Use   Smoking status: Never   Smokeless tobacco: Never  Vaping Use   Vaping Use: Never used  Substance and Sexual Activity   Alcohol use: Not Currently    Alcohol/week: 0.0 standard drinks of alcohol    Comment: rarely   Drug use: No   Sexual activity: Yes    Partners: Male    Comment: undecided  Other Topics Concern   Not on file  Social History Narrative   Not on file   Social Determinants of Health   Financial Resource Strain: Medium Risk (11/16/2022)   Overall Financial Resource Strain (CARDIA)    Difficulty of Paying Living Expenses: Somewhat hard  Food Insecurity: Food Insecurity Present (11/16/2022)   Hunger Vital Sign    Worried About Running Out of Food in the Last Year: Sometimes true    Ran Out of Food in the Last Year: Sometimes true  Transportation Needs: No  Transportation Needs (06/14/2022)   PRAPARE - Administrator, Civil Service (Medical): No    Lack of Transportation (Non-Medical): No  Physical Activity: Insufficiently Active (11/16/2022)   Exercise Vital Sign    Days of Exercise per Week: 3 days    Minutes of Exercise per Session: 20 min  Stress: No Stress Concern Present (11/16/2022)   [employer] of Occupational Health - Occupational Stress Questionnaire    Feeling of Stress : Only a little  Social Connections: Moderately Isolated (11/16/2022)   Social Connection and Isolation Panel [NHANES]    Frequency of Communication with Friends and Family: More than three times a week    Frequency of Social Gatherings with Friends and Family: Never    Attends Religious Services: Never    Database administrator or Organizations: No    Attends Club or  Organization Meetings: Never    Marital Status: Married  Catering manager Violence: Not At Risk (11/16/2022)   Humiliation, Afraid, Rape, and Kick questionnaire    Fear of Current or Ex-Partner: No    Emotionally Abused: No    Physically Abused: No    Sexually Abused: No    Family History  Problem Relation Age of Onset   Osteoporosis Mother    Alcohol abuse Father    Drug abuse Father    Healthy Sister    ADD / ADHD Brother    OCD Brother    Diabetes Maternal Grandmother    Pancreatic cancer Maternal Grandmother 92   Depression Paternal Grandmother    COPD Paternal Grandmother    Alcohol abuse Maternal Aunt    Bipolar disorder Maternal Aunt    Diabetes Maternal Aunt    Breast cancer Maternal Aunt 68       BRCA neg   Diabetes Maternal Uncle    Prostate cancer Maternal Uncle     Review of Systems  Constitutional: Negative.   HENT: Negative.    Eyes: Negative.   Respiratory: Negative.    Cardiovascular: Negative.   Gastrointestinal:        Hemorrhoids  Genitourinary: Negative.   Musculoskeletal: Negative.   Skin:        Perineal pain  Neurological:  Negative.   Endo/Heme/Allergies: Negative.   Psychiatric/Behavioral:  Positive for memory loss. The patient is nervous/anxious.      Physical Exam BP 123/73   Pulse 79   Wt 195 lb 11.2 oz (88.8 kg)   BMI 29.76 kg/m   Physical Exam Constitutional:      Appearance: Normal appearance.  Genitourinary:     Vulva normal.     Genitourinary Comments: Hemorrhoids. Perineum in the process of healing. Her bilateral vaginal rears are still healing, and the left side is not completely intact. Normal vaginal  lochia flow for 2 weeks PP  Some suture material is seen at the perineum and trimmed by this provider.        Vaginal discharge present.  HENT:     Head: Normocephalic and atraumatic.  Cardiovascular:     Rate and Rhythm: Normal rate and regular rhythm.     Pulses: Normal pulses.     Heart sounds: Normal heart sounds.  Pulmonary:     Effort: Pulmonary effort is normal.     Breath sounds: Normal breath sounds.  Abdominal:     Palpations: Abdomen is soft.  Musculoskeletal:     Cervical back: Normal range of motion and neck supple.  Neurological:     Mental Status: She is alert.   PHQ is 16, GAD is 15 and [place] is 59.   Female Chaperone present during breast and/or pelvic exam.  Assessment: 32 y.o. [id number] presenting for 2 week postpartum visit Perineal lacerations still healing. Some memory loss and elevated mood measures   Plan: Problem List Items Addressed This Visit       Other   Postpartum care following vaginal delivery - Primary   Other Visit Diagnoses     Supervision of high risk pregnancy in third trimester       BP check         1) I trimmed the small visible sutture material today She is advised to do sitz baths BID, and use both ice for perineal pain, tylenol and Benzicaine spray.  Continue on her labetalol  She has an appointment with her psychiatrist. I have advised her to  move up this visit to within a few weeks.    4) Follow up 1 month  for  her 6 week PP visit, to check her Bps, consider decreasing her labetalol.  Should she have additional memory loss, she is to call our office. The importance of sleep and continued perineal healing is encouraged today. Mirna Mires, CNM  05/29/2023 12:20 PM   05/29/2023 11:54 AM

## 2023-06-02 ENCOUNTER — Telehealth: Payer: BC Managed Care – PPO | Admitting: Obstetrics

## 2023-06-15 ENCOUNTER — Encounter: Payer: Self-pay | Admitting: Internal Medicine

## 2023-06-15 ENCOUNTER — Ambulatory Visit: Payer: BC Managed Care – PPO | Admitting: Internal Medicine

## 2023-06-15 VITALS — BP 122/76 | HR 99 | Temp 97.6°F | Ht 68.0 in | Wt 192.4 lb

## 2023-06-15 DIAGNOSIS — G43009 Migraine without aura, not intractable, without status migrainosus: Secondary | ICD-10-CM

## 2023-06-15 DIAGNOSIS — J029 Acute pharyngitis, unspecified: Secondary | ICD-10-CM

## 2023-06-15 DIAGNOSIS — J3089 Other allergic rhinitis: Secondary | ICD-10-CM | POA: Diagnosis not present

## 2023-06-15 LAB — POC COVID19 BINAXNOW: SARS Coronavirus 2 Ag: NEGATIVE

## 2023-06-15 LAB — POCT RAPID STREP A (OFFICE): Rapid Strep A Screen: NEGATIVE

## 2023-06-15 MED ORDER — MONTELUKAST SODIUM 10 MG PO TABS
10.0000 mg | ORAL_TABLET | Freq: Every day | ORAL | 1 refills | Status: DC
Start: 2023-06-15 — End: 2024-04-05

## 2023-06-15 MED ORDER — TOPIRAMATE 50 MG PO TABS
50.0000 mg | ORAL_TABLET | Freq: Two times a day (BID) | ORAL | 1 refills | Status: DC
Start: 2023-06-15 — End: 2024-01-01

## 2023-06-15 NOTE — Progress Notes (Signed)
Date:  06/15/2023   Name:  Brianna Marshall   DOB:  September 20, 1991   MRN:  [id number]   Chief Complaint: Sore Throat (Painful to swallow and hurts when talking. Cough with production sometimes. No fever. Has newborn at home so wants to be sure she does not have anything serious.)  Sore Throat  This is a new problem. The current episode started in the past 7 days. The problem has been unchanged. Associated symptoms include coughing, headaches and trouble swallowing. Pertinent negatives include no congestion, ear pain, hoarse voice, plugged ear sensation or shortness of breath. She has had no exposure to strep or mono.  Migraine  This is a recurrent problem. The problem has been unchanged. Associated symptoms include coughing and a sore throat. Pertinent negatives include no ear pain or fever. Treatments tried: wants to resume Topamax.  Cough - present off and on for weeks.  She does have allergies and takes Claritin and Singulair. She has a newborn at home and does not want to transmit infection.  Lab Results  Component Value Date   NA 134 (L) 05/16/2023   K 3.7 05/16/2023   CO2 19 (L) 05/16/2023   GLUCOSE 152 (H) 05/16/2023   BUN 17 05/16/2023   CREATININE 0.79 05/16/2023   CALCIUM 8.8 (L) 05/16/2023   EGFR 121 05/10/2023   GFRNONAA >60 05/16/2023   No results found for: "CHOL", "HDL", "LDLCALC", "LDLDIRECT", "TRIG", "CHOLHDL" Lab Results  Component Value Date   TSH 1.530 12/17/2019   Lab Results  Component Value Date   HGBA1C 5.4 12/17/2019   Lab Results  Component Value Date   WBC 11.4 (H) 05/18/2023   HGB 9.6 (L) 05/18/2023   HCT 29.9 (L) 05/18/2023   MCV 84.7 05/18/2023   PLT 331 05/18/2023   Lab Results  Component Value Date   ALT 18 05/16/2023   AST 28 05/16/2023   ALKPHOS 152 (H) 05/16/2023   BILITOT 0.5 05/16/2023   No results found for: "25OHVITD2", "25OHVITD3", "VD25OH"   Review of Systems  Constitutional:  Negative for chills, fatigue and  fever.  HENT:  Positive for sore throat and trouble swallowing. Negative for congestion, ear pain, facial swelling and hoarse voice.   Respiratory:  Positive for cough. Negative for chest tightness, shortness of breath and wheezing.   Cardiovascular:  Negative for chest pain.  Neurological:  Positive for headaches.    Patient Active Problem List   Diagnosis Date Noted   Migraine without aura and without status migrainosus, not intractable 12/17/2019   Sleep disorder 12/17/2019   Alopecia 12/17/2019   Insulin resistance 01/02/2018   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   PCOS (polycystic ovarian syndrome) 07/08/2016   Prediabetes 07/08/2016   Vitamin D deficiency 07/08/2016    Allergies  Allergen Reactions   Shellfish Allergy Nausea And Vomiting and Nausea Only   Latex Dermatitis   Amoxicillin Other (See Comments) and Rash   Lidocaine Rash    topical    Past Surgical History:  Procedure Laterality Date   ENDOSCOPIC CONCHA BULLOSA RESECTION Right 12/30/2021   Procedure: RIGHT CONCHA BULLOSA REDUCTION VIA NASAL ENDOSCOPY;  Surgeon: Vernie Murders, MD;  Location: [facility];  Service: ENT;  Laterality: Right;   INCISE AND DRAIN ABCESS Right 2011   Posterior Thigh   sebaceous cyst removal  07/2017   Back of neck   SEPTOPLASTY N/A 12/30/2021   Procedure: SEPTOPLASTY;  Surgeon: Vernie Murders, MD;  Location: [facility];  Service: ENT;  Laterality: N/A;   TURBINATE REDUCTION Right 12/30/2021   Procedure: PARTIAL EXCISION RIGHT INFERIOR TURBINATE;  Surgeon: Vernie Murders, MD;  Location: [facility];  Service: ENT;  Laterality: Right;   WISDOM TOOTH EXTRACTION      Social History   Tobacco Use   Smoking status: Never   Smokeless tobacco: Never  Vaping Use   Vaping Use: Never used  Substance Use Topics   Alcohol use: Not Currently    Alcohol/week: 0.0 standard drinks of alcohol    Comment: rarely   Drug use: No     Medication list  has been reviewed and updated.  Current Meds  Medication Sig   busPIRone (BUSPAR) 10 MG tablet Take 10 mg by mouth 2 (two) times daily.   ibuprofen (ADVIL) 600 MG tablet Take 1 tablet (600 mg total) by mouth every 6 (six) hours.   labetalol (NORMODYNE) 200 MG tablet Take 1 tablet (200 mg total) by mouth 2 (two) times daily.   loratadine (CLARITIN REDITABS) 10 MG dissolvable tablet Take 10 mg by mouth daily.   sertraline (ZOLOFT) 100 MG tablet Take 1.5 tablets (150 mg total) by mouth daily.   topiramate (TOPAMAX) 50 MG tablet Take 1 tablet (50 mg total) by mouth 2 (two) times daily.   [DISCONTINUED] montelukast (SINGULAIR) 10 MG tablet Take 10 mg by mouth at bedtime.   [DISCONTINUED] ondansetron (ZOFRAN-ODT) 4 MG disintegrating tablet Take 1 tablet (4 mg total) by mouth every 6 (six) hours as needed for nausea.       06/15/2023    9:23 AM 05/29/2023   12:01 PM 02/24/2023    2:12 PM 11/08/2022    9:04 AM  GAD 7 : Generalized Anxiety Score  Nervous, Anxious, on Edge 2 2 0 1  Control/stop worrying 2 1 0 0  Worry too much - different things 1 2 1 2   Trouble relaxing 3 3 1 2   Restless 1 3 0 1  Easily annoyed or irritable 2 3 1 3   Afraid - awful might happen 1 1 0 0  Total GAD 7 Score 12 15 3 9   Anxiety Difficulty Somewhat difficult Somewhat difficult Not difficult at all Somewhat difficult       06/15/2023    9:23 AM 05/29/2023   12:01 PM 02/24/2023    2:11 PM  Depression screen PHQ 2/9  Decreased Interest 0 2 0  Down, Depressed, Hopeless 1 1 0  PHQ - 2 Score 1 3 0  Altered sleeping 3 2 3   Tired, decreased energy 3 3 3   Change in appetite 2 3 3   Feeling bad or failure about yourself  2 1 0  Trouble concentrating 2 3 2   Moving slowly or fidgety/restless 1 1 0  Suicidal thoughts 0 0 0  PHQ-9 Score 14 16 11   Difficult doing work/chores Very difficult Somewhat difficult Somewhat difficult    BP Readings from Last 3 Encounters:  06/15/23 122/76  05/29/23 123/73  05/25/23 118/83     Physical Exam Vitals and nursing note reviewed.  Constitutional:      General: She is not in acute distress.    Appearance: She is well-developed.  HENT:     Head: Normocephalic and atraumatic.     Right Ear: Tympanic membrane and ear canal normal.     Left Ear: Tympanic membrane and ear canal normal.     Nose:     Right Sinus: No maxillary sinus tenderness.     Left Sinus: No  maxillary sinus tenderness.     Mouth/Throat:     Tonsils: No tonsillar exudate.     Comments: Mild erythema bilaterally Eyes:     Extraocular Movements:     Right eye: Normal extraocular motion.     Conjunctiva/sclera: Conjunctivae normal.  Neck:     Thyroid: No thyromegaly.  Cardiovascular:     Rate and Rhythm: Normal rate and regular rhythm.  Pulmonary:     Effort: Pulmonary effort is normal. No respiratory distress.     Breath sounds: No wheezing or rhonchi.  Skin:    General: Skin is warm and dry.     Findings: No rash.  Neurological:     Mental Status: She is alert and oriented to person, place, and time.  Psychiatric:        Mood and Affect: Mood normal.        Behavior: Behavior normal.     Wt Readings from Last 3 Encounters:  06/15/23 192 lb 6.4 oz (87.3 kg)  05/29/23 195 lb 11.2 oz (88.8 kg)  05/25/23 192 lb 14.4 oz (87.5 kg)    BP 122/76   Pulse 99   Temp 97.6 F (36.4 C) (Oral)   Ht 5\' 8"  (1.727 m)   Wt 192 lb 6.4 oz (87.3 kg)   SpO2 100%   BMI 29.25 kg/m   Assessment and Plan:  Problem List Items Addressed This Visit     Migraine without aura and without status migrainosus, not intractable (Chronic)    Has done well on Topomax in the past - held during recent pregnancy. She is not breast feeding and would like to resume.      Relevant Medications   topiramate (TOPAMAX) 50 MG tablet   Other Visit Diagnoses     Sore throat    -  Primary   Strep and Covid are negative recommend supportive care - tylenol, warm liquids continue claritin or similar and Singulair    Relevant Orders   POCT rapid strep A   POC COVID-19 BinaxNow   Environmental and seasonal allergies       Relevant Medications   montelukast (SINGULAIR) 10 MG tablet       No follow-ups on file.   Partially dictated using Dragon software, any errors are not intentional.  Reubin Milan, MD [facility] [place], [place]

## 2023-06-15 NOTE — Assessment & Plan Note (Signed)
Has done well on Topomax in the past - held during recent pregnancy. She is not breast feeding and would like to resume.

## 2023-06-21 ENCOUNTER — Telehealth: Payer: Self-pay

## 2023-06-21 NOTE — Telephone Encounter (Signed)
[facility]- Discharge Call Backs-Spoke to patient on the phone about the following below. 1-Do you have any questions or concerns about yourself as you heal? No 2-Any concerns or questions about your baby? No 3-Reviewed ABC's of safe sleep. 4-How was your stay at the hospital?Great 5- Did our team work together to care for you?Yes You should be receiving a survey in the mail soon.   We would really appreciate it if you could fill that out for us and return it in the mail.  We value the feedback to make improvements and continue the great work we do.   If you have any questions please feel free to call me back at [phone]  

## 2023-06-26 ENCOUNTER — Ambulatory Visit (INDEPENDENT_AMBULATORY_CARE_PROVIDER_SITE_OTHER): Payer: BC Managed Care – PPO | Admitting: Obstetrics

## 2023-06-26 ENCOUNTER — Encounter: Payer: Self-pay | Admitting: Obstetrics

## 2023-06-26 VITALS — BP 111/76 | HR 92 | Ht 68.0 in | Wt 194.0 lb

## 2023-06-26 DIAGNOSIS — O1493 Unspecified pre-eclampsia, third trimester: Secondary | ICD-10-CM

## 2023-06-26 DIAGNOSIS — Z3202 Encounter for pregnancy test, result negative: Secondary | ICD-10-CM

## 2023-06-26 DIAGNOSIS — Z30430 Encounter for insertion of intrauterine contraceptive device: Secondary | ICD-10-CM

## 2023-06-26 DIAGNOSIS — Z3009 Encounter for other general counseling and advice on contraception: Secondary | ICD-10-CM

## 2023-06-26 DIAGNOSIS — Z975 Presence of (intrauterine) contraceptive device: Secondary | ICD-10-CM

## 2023-06-26 LAB — POCT URINE PREGNANCY: Preg Test, Ur: NEGATIVE

## 2023-06-26 MED ORDER — LABETALOL HCL 200 MG PO TABS: 100.0000 mg | ORAL_TABLET | Freq: Two times a day (BID) | ORAL | 3 refills | Status: DC

## 2023-06-26 MED ORDER — LEVONORGESTREL 20 MCG/DAY IU IUD
1.0000 | INTRAUTERINE_SYSTEM | Freq: Once | INTRAUTERINE | Status: AC
Start: 2023-06-26 — End: 2023-06-26
  Administered 2023-06-26: 1 via INTRAUTERINE

## 2023-06-26 NOTE — Progress Notes (Addendum)
Postpartum Visit  Chief Complaint:  Chief Complaint  Patient presents with   Postpartum Care    Patient reports that she and baby are doing well, baby is bottle fed with Nutramigen and tolerating formula fairly well. Patient reports that baby is lactose and is trying different formulas. Patient reports that she and baby have been experiencing loose bowel movements. Patient denies urinary concerns of vaginal bleeding, patient denies being sexually active since birth and would like to discuss contraception options.     History of Present Illness: Patient is a 32 y.o. [id number] presents for postpartum visit.  Date of delivery: 05/18/2023 Type of delivery: Vaginal delivery - Vacuum or forceps assisted  no Episiotomy No.  Laceration: yes  Pregnancy or labor problems:  GDM, polyhydramnios and preeclampsia- had a postpartum bleed and then started on magnesium sulphate for severe range pressures. Any problems since the delivery:  she has had mood issues and a recent medication change from buspar and Abilify to Wellbutrin. Sees a psychiatrist.  Newborn Details:  SINGLETON :  1. Baby's name: girl.  Wren. Birth weight: 3380 gms Maternal Details:  Breast Feeding:  no Post partum depression/anxiety noted:  yes Edinburgh Post-Partum Depression Score:  18  Date of last PAP: 2024  normal   Past Medical History:  Diagnosis Date   Anemia in pregnancy 05/30/2018   Antepartum mild preeclampsia, third trimester 07/30/2018   Asthma    COVID-19 02/2020   Depression    Elevated blood pressure affecting pregnancy in third trimester, antepartum 05/05/2023   Family history of pancreatic cancer    Gestational diabetes 06/11/2018   Gestational diabetes mellitus (GDM) affecting third pregnancy 07/29/2018   Gestational diabetes mellitus (GDM) controlled on oral hypoglycemic drug 06/11/2018   Headache, migraine 07/14/2015   approx 2x/month   Hyperemesis 06/05/2018   Infertility, anovulation 03/10/2017    Nausea/vomiting in pregnancy 01/17/2018   Obesity    PCOS (polycystic ovarian syndrome) 07/08/2016   Pre-eclampsia, severe, delivered 07/30/2018   Prediabetes 07/08/2016   Hgb 5.8  04/2015    Supervision of high risk pregnancy, antepartum 01/02/2018   [facility] Prenatal Labs Dating Early [place] Blood type: O/Positive/-- (01/29 1038)  Genetic Screen 1 Screen: Neg Antibody:Negative (01/29 1038) Anatomic [place] Normal, anterior placenta,  Rubella: <0.90 (01/29 1038) Varicella: Imm GTT Early: 123 Third trimester:  RPR: Non Reactive (01/29 1038)  Rhogam n/a HBsAg: Negative (01/29 1038)  TDaP vaccine        05/30/2018    Flu Shot: 09/2017 HIV: Non Reac   Vertigo    for short time after covid   Vitamin D deficiency 07/08/2016   18.7 in 04/2015     Past Surgical History:  Procedure Laterality Date   ENDOSCOPIC CONCHA BULLOSA RESECTION Right 12/30/2021   Procedure: RIGHT CONCHA BULLOSA REDUCTION VIA NASAL ENDOSCOPY;  Surgeon: Vernie Murders, MD;  Location: [facility];  Service: ENT;  Laterality: Right;   INCISE AND DRAIN ABCESS Right 2011   Posterior Thigh   sebaceous cyst removal  07/2017   Back of neck   SEPTOPLASTY N/A 12/30/2021   Procedure: SEPTOPLASTY;  Surgeon: Vernie Murders, MD;  Location: [facility];  Service: ENT;  Laterality: N/A;   TURBINATE REDUCTION Right 12/30/2021   Procedure: PARTIAL EXCISION RIGHT INFERIOR TURBINATE;  Surgeon: Vernie Murders, MD;  Location: [facility];  Service: ENT;  Laterality: Right;   WISDOM TOOTH EXTRACTION      Prior to Admission medications   Medication Sig Start Date End Date  Taking? Authorizing Provider  buPROPion ER (WELLBUTRIN SR) 100 MG 12 hr tablet Take 100 mg by mouth 2 (two) times daily. 06/24/23  Yes [provider]  ibuprofen (ADVIL) 600 MG tablet Take 1 tablet (600 mg total) by mouth every 6 (six) hours. 05/19/23  Yes Mirna Mires, CNM  labetalol (NORMODYNE) 200 MG tablet Take 1 tablet (200 mg total) by mouth 2  (two) times daily. 05/18/23  Yes Mirna Mires, CNM  montelukast (SINGULAIR) 10 MG tablet Take 1 tablet (10 mg total) by mouth at bedtime. 06/15/23  Yes Reubin Milan, MD  sertraline (ZOLOFT) 100 MG tablet Take 1.5 tablets (150 mg total) by mouth daily. 04/14/23  Yes Linzie Collin, MD  topiramate (TOPAMAX) 50 MG tablet Take 1 tablet (50 mg total) by mouth 2 (two) times daily. 06/15/23  Yes Reubin Milan, MD  busPIRone (BUSPAR) 10 MG tablet Take 10 mg by mouth 2 (two) times daily. Patient not taking: Reported on 06/26/2023 06/09/23   [provider]  loratadine (CLARITIN REDITABS) 10 MG dissolvable tablet Take 10 mg by mouth daily. Patient not taking: Reported on 06/26/2023    [provider]    Allergies  Allergen Reactions   Shellfish Allergy Nausea And Vomiting and Nausea Only   Latex Dermatitis   Amoxicillin Other (See Comments) and Rash   Lidocaine Rash    topical     Social History   Socioeconomic History   Marital status: Married    Spouse name: Denyse Amass   Number of children: 1   Years of education: 12   Highest education level: High school graduate  Occupational History   Occupation: Printmaker  Tobacco Use   Smoking status: Never   Smokeless tobacco: Never  Vaping Use   Vaping Use: Never used  Substance and Sexual Activity   Alcohol use: Not Currently    Alcohol/week: 0.0 standard drinks of alcohol    Comment: rarely   Drug use: No   Sexual activity: Yes    Partners: Male    Comment: undecided  Other Topics Concern   Not on file  Social History Narrative   Not on file   Social Determinants of Health   Financial Resource Strain: Medium Risk (11/16/2022)   Overall Financial Resource Strain (CARDIA)    Difficulty of Paying Living Expenses: Somewhat hard  Food Insecurity: Food Insecurity Present (11/16/2022)   Hunger Vital Sign    Worried About Running Out of Food in the Last Year: Sometimes true    Ran Out of Food in the Last  Year: Sometimes true  Transportation Needs: No Transportation Needs (06/14/2022)   PRAPARE - Administrator, Civil Service (Medical): No    Lack of Transportation (Non-Medical): No  Physical Activity: Insufficiently Active (11/16/2022)   Exercise Vital Sign    Days of Exercise per Week: 3 days    Minutes of Exercise per Session: 20 min  Stress: No Stress Concern Present (11/16/2022)   [employer] of Occupational Health - Occupational Stress Questionnaire    Feeling of Stress : Only a little  Social Connections: Moderately Isolated (11/16/2022)   Social Connection and Isolation Panel [NHANES]    Frequency of Communication with Friends and Family: More than three times a week    Frequency of Social Gatherings with Friends and Family: Never    Attends Religious Services: Never    Database administrator or Organizations: No    Attends Banker Meetings: Never  Marital Status: Married  Catering manager Violence: Not At Risk (11/16/2022)   Humiliation, Afraid, Rape, and Kick questionnaire    Fear of Current or Ex-Partner: No    Emotionally Abused: No    Physically Abused: No    Sexually Abused: No    Family History  Problem Relation Age of Onset   Osteoporosis Mother    Alcohol abuse Father    Drug abuse Father    Healthy Sister    ADD / ADHD Brother    OCD Brother    Diabetes Maternal Grandmother    Pancreatic cancer Maternal Grandmother 37   Depression Paternal Grandmother    COPD Paternal Grandmother    Alcohol abuse Maternal Aunt    Bipolar disorder Maternal Aunt    Diabetes Maternal Aunt    Breast cancer Maternal Aunt 54       BRCA neg   Diabetes Maternal Uncle    Prostate cancer Maternal Uncle     Review of Systems  Constitutional: Negative.   HENT: Negative.    Eyes: Negative.   Respiratory: Negative.    Cardiovascular: Negative.   Gastrointestinal: Negative.   Genitourinary: Negative.   Musculoskeletal: Negative.   Skin:  Negative.   Neurological: Negative.   Endo/Heme/Allergies: Negative.   Psychiatric/Behavioral:  The patient is nervous/anxious.        Edinburgh score is 18. She has recently changed from Buspar and Abilify to Wellbutrin. Hx of mood issues and is followed by a Psychiatrist. Has an appt this week.     Physical Exam BP 111/76   Pulse 92   Ht 5\' 8"  (1.727 m)   Wt 194 lb (88 kg)   Breastfeeding No   BMI 29.50 kg/m   Physical Exam Constitutional:      Appearance: Normal appearance.  Genitourinary:     Vulva normal.     Genitourinary Comments: Perineal laceration now healed. Visible cystocele and rectocele noted. Uterus is anteverted and midline, non enlarged.  IUD insertion today- see Procedure note  HENT:     Head: Normocephalic and atraumatic.  Cardiovascular:     Rate and Rhythm: Normal rate and regular rhythm.     Pulses: Normal pulses.     Heart sounds: Normal heart sounds.  Pulmonary:     Effort: Pulmonary effort is normal.     Breath sounds: Normal breath sounds.  Musculoskeletal:        General: Normal range of motion.     Cervical back: Normal range of motion and neck supple.  Neurological:     General: No focal deficit present.     Mental Status: She is oriented to person, place, and time.  Skin:    General: Skin is warm and dry.  Psychiatric:        Mood and Affect: Mood normal.        Behavior: Behavior normal.     Comments: Edinburgh score is 25      Female Chaperone present during breast and/or pelvic exam.  Assessment: 32 y.o. [id number] presenting for 6 week postpartum visit S/P Preeclampsia- still on Labetalol 200 mg po BID. Normotensive today  Anxiety/depressive  symptoms  Plan: Problem List Items Addressed This Visit   None Visit Diagnoses     Encounter for counseling regarding contraception    -  Primary   Relevant Orders   POCT urine pregnancy        1) Contraception Education given regarding options for contraception, including IUD  placement, oral contraceptives, We discussed  her options and she does not want any more children . She initially thought she would use pills but during our discussion opts for  a Mirena IUD. Fully consented with the risk/benefits reviewed carefully.  IUD Insertion Procedure Note Patient identified, informed consent performed, consent signed.   Discussed risks of irregular bleeding, cramping, infection, malpositioning, expulsion or uterine perforation of the IUD (1:1000 placements)  which may require further procedure such as laparoscopy.  IUD while effective at preventing pregnancy do not prevent transmission of sexually transmitted diseases and use of barrier methods for this purpose was discussed. Time out was performed.  Urine pregnancy test negative.  Speculum placed in the vagina.  Cervix visualized.  Cleaned with Betadine x 2.  Grasped anteriorly with a single tooth tenaculum.  Uterus sounded to 7.5 cm. IUD placed per manufacturer's recommendations.  Strings trimmed to 3 cm. Tenaculum was removed, good hemostasis noted.  Patient tolerated procedure well.   Patient was given post-procedure instructions.  She was advised to have backup contraception for one week.  Patient was also asked to check IUD strings periodically and follow up in 4 weeks for IUD check.  2)  Pap - ASCCP guidelines and rational discussed.  Patient opts for q 3 years screening interval  3) Patient underwent screening for postpartum depression with some concerns noted. She is closely followed by her Psychiatrist who is meeting with her this week.   4) Follow up 1 month for  a blood pressure check and IUD string check. Based on today's BP readings  I have dropped her labetalol dose to 100 mg po BID.  She will return in 4 weeks for an IUD check and another BP check.  Mirna Mires, CNM  06/26/2023 10:26 AM   06/26/2023 10:26 AM

## 2023-07-31 ENCOUNTER — Ambulatory Visit: Payer: BC Managed Care – PPO | Admitting: Obstetrics

## 2023-07-31 DIAGNOSIS — Z30431 Encounter for routine checking of intrauterine contraceptive device: Secondary | ICD-10-CM

## 2023-07-31 DIAGNOSIS — Z0130 Encounter for examination of blood pressure without abnormal findings: Secondary | ICD-10-CM

## 2023-07-31 NOTE — Progress Notes (Signed)
    GYNECOLOGY PROGRESS NOTE  Subjective:    Patient ID: Brianna Marshall, female    DOB: 07/29/1991, 32 y.o.   MRN: [id number]  HPI  Patient is a 31 y.o. G14P2002 female who presents for IUD check and Blood Pressure Check. She recently had pre eclampsia with her last pregnancy.  She was started on Labetalol for her blood pressure. She has not missed and doses of her medication, continues to take as prescribed. She denies having chest pain, palpitations, sob, dizziness or visual disturbances. She is also here today for today for IUD string check; Mirena  IUD was placed  07/08. No complaints about the IUD, no concerning side effects.  The following portions of the patient's history were reviewed and updated as appropriate: allergies, current medications, past family history, past medical history, past social history, past surgical history and problem list. Last pap smear on 01/12/2023 was normal, negative HRHPV.   The following portions of the patient's history were reviewed and updated as appropriate: allergies, current medications, past family history, past medical history, past social history, past surgical history, and problem list.  Review of Systems Pertinent items are noted in HPI.   Objective:   not currently breastfeeding. There is no height or weight on file to calculate BMI. General appearance: alert, cooperative, and no distress Abdomen: soft, non-tender; bowel sounds normal; no masses,  no organomegaly Pelvic: cervix normal in appearance, external genitalia normal, no adnexal masses or tenderness, no cervical motion tenderness, rectovaginal septum normal, uterus normal size, shape, and consistency, vagina normal without discharge, and IUD strings are visible Extremities: extremities normal, atraumatic, no cyanosis or edema Neurologic: Grossly normal   Assessment:   IUD string check- WNL. Hr blood pressures have been normal on the Labetalol 100 mg po BID. Will  stop the Labetalol and she will RTC in 4 wks  for a BP check  Patient to keep IUD in place for up to 8 years; can come in for removal if she desires pregnancy earlier or for any concerning side effects.   Plan:   There are no diagnoses linked to this encounter. See note above.     Paula Compton, CNM [facility].

## 2023-08-28 ENCOUNTER — Ambulatory Visit (INDEPENDENT_AMBULATORY_CARE_PROVIDER_SITE_OTHER): Payer: BC Managed Care – PPO | Admitting: Obstetrics

## 2023-08-28 ENCOUNTER — Ambulatory Visit (INDEPENDENT_AMBULATORY_CARE_PROVIDER_SITE_OTHER): Payer: BC Managed Care – PPO

## 2023-08-28 ENCOUNTER — Encounter: Payer: Self-pay | Admitting: Obstetrics

## 2023-08-28 ENCOUNTER — Ambulatory Visit: Payer: BC Managed Care – PPO

## 2023-08-28 VITALS — BP 109/78 | HR 97 | Resp 16 | Ht 68.0 in | Wt 204.2 lb

## 2023-08-28 DIAGNOSIS — Z0130 Encounter for examination of blood pressure without abnormal findings: Secondary | ICD-10-CM

## 2023-08-28 NOTE — Progress Notes (Signed)
Brianna Marshall was scheduled for a blood pressure check today. She was taken off of Labetalol one month ago.The office made her a provider appointment, which was deemed unnecessary. She was seen briefly by this provider, and noted to be normotensive. She is doing weel, denied any headaches or sxs of HTN, and also shared that she is having no problems with her IUD.  She was not charged for an office visit, rather a Nurse visit only.  She knows to set up an annual PE next year. Mirna Mires, CNM  08/28/2023 12:57 PM

## 2023-08-28 NOTE — Patient Instructions (Signed)
Managing Your Hypertension Hypertension, also called high blood pressure, is when the force of the blood pressing against the walls of the arteries is too strong. Arteries are blood vessels that carry blood from your heart throughout your body. Hypertension forces the heart to work harder to pump blood and may cause the arteries to become narrow or stiff. Understanding blood pressure readings A blood pressure reading includes a higher number over a lower number: The first, or top, number is called the systolic pressure. It is a measure of the pressure in your arteries as your heart beats. The second, or bottom number, is called the diastolic pressure. It is a measure of the pressure in your arteries as the heart relaxes. For most people, a normal blood pressure is below 120/80. Your personal target blood pressure may vary depending on your medical conditions, your age, and other factors. Blood pressure is classified into four stages. Based on your blood pressure reading, your health care provider may use the following stages to determine what type of treatment you need, if any. Systolic pressure and diastolic pressure are measured in a unit called millimeters of mercury (mmHg). Normal Systolic pressure: below 120. Diastolic pressure: below 80. Elevated Systolic pressure: 120-129. Diastolic pressure: below 80. Hypertension stage 1 Systolic pressure: 130-139. Diastolic pressure: 80-89. Hypertension stage 2 Systolic pressure: 140 or above. Diastolic pressure: 90 or above. How can this condition affect me? Managing your hypertension is very important. Over time, hypertension can damage the arteries and decrease blood flow to parts of the body, including the brain, heart, and kidneys. Having untreated or uncontrolled hypertension can lead to: A heart attack. A stroke. A weakened blood vessel (aneurysm). Heart failure. Kidney damage. Eye damage. Memory and concentration problems. Vascular  dementia. What actions can I take to manage this condition? Hypertension can be managed by making lifestyle changes and possibly by taking medicines. Your health care provider will help you make a plan to bring your blood pressure within a normal range. You may be referred for counseling on a healthy diet and physical activity. Nutrition  Eat a diet that is high in fiber and potassium, and low in salt (sodium), added sugar, and fat. An example eating plan is called the DASH diet. DASH stands for Dietary Approaches to Stop Hypertension. To eat this way: Eat plenty of fresh fruits and vegetables. Try to fill one-half of your plate at each meal with fruits and vegetables. Eat whole grains, such as whole-wheat pasta, brown rice, or whole-grain bread. Fill about one-fourth of your plate with whole grains. Eat low-fat dairy products. Avoid fatty cuts of meat, processed or cured meats, and poultry with skin. Fill about one-fourth of your plate with lean proteins such as fish, chicken without skin, beans, eggs, and tofu. Avoid pre-made and processed foods. These tend to be higher in sodium, added sugar, and fat. Reduce your daily sodium intake. Many people with hypertension should eat less than 1,500 mg of sodium a day. Lifestyle  Work with your health care provider to maintain a healthy body weight or to lose weight. Ask what an ideal weight is for you. Get at least 30 minutes of exercise that causes your heart to beat faster (aerobic exercise) most days of the week. Activities may include walking, swimming, or biking. Include exercise to strengthen your muscles (resistance exercise), such as weight lifting, as part of your weekly exercise routine. Try to do these types of exercises for 30 minutes at least 3 days a week. Do   not use any products that contain nicotine or tobacco. These products include cigarettes, chewing tobacco, and vaping devices, such as e-cigarettes. If you need help quitting, ask your  health care provider. Control any long-term (chronic) conditions you have, such as high cholesterol or diabetes. Identify your sources of stress and find ways to manage stress. This may include meditation, deep breathing, or making time for fun activities. Alcohol use Do not drink alcohol if: Your health care provider tells you not to drink. You are pregnant, may be pregnant, or are planning to become pregnant. If you drink alcohol: Limit how much you have to: 0-1 drink a day for women. 0-2 drinks a day for men. Know how much alcohol is in your drink. In the [place] one drink equals one 12 oz bottle of beer (355 mL), one 5 oz glass of wine (148 mL), or one 1 oz glass of hard liquor (44 mL). Medicines Your health care provider may prescribe medicine if lifestyle changes are not enough to get your blood pressure under control and if: Your systolic blood pressure is 130 or higher. Your diastolic blood pressure is 80 or higher. Take medicines only as told by your health care provider. Follow the directions carefully. Blood pressure medicines must be taken as told by your health care provider. The medicine does not work as well when you skip doses. Skipping doses also puts you at risk for problems. Monitoring Before you monitor your blood pressure: Do not smoke, drink caffeinated beverages, or exercise within 30 minutes before taking a measurement. Use the bathroom and empty your bladder (urinate). Sit quietly for at least 5 minutes before taking measurements. Monitor your blood pressure at home as told by your health care provider. To do this: Sit with your back straight and supported. Place your feet flat on the floor. Do not cross your legs. Support your arm on a flat surface, such as a table. Make sure your upper arm is at heart level. Each time you measure, take two or three readings one minute apart and record the results. You may also need to have your blood pressure checked regularly by  your health care provider. General information Talk with your health care provider about your diet, exercise habits, and other lifestyle factors that may be contributing to hypertension. Review all the medicines you take with your health care provider because there may be side effects or interactions. Keep all follow-up visits. Your health care provider can help you create and adjust your plan for managing your high blood pressure. Where to find more information National Heart, Lung, and Blood Institute: www.nhlbi.nih.gov American Heart Association: www.heart.org Contact a health care provider if: You think you are having a reaction to medicines you have taken. You have repeated (recurrent) headaches. You feel dizzy. You have swelling in your ankles. You have trouble with your vision. Get help right away if: You develop a severe headache or confusion. You have unusual weakness or numbness, or you feel faint. You have severe pain in your chest or abdomen. You vomit repeatedly. You have trouble breathing. These symptoms may be an emergency. Get help right away. Call 911. Do not wait to see if the symptoms will go away. Do not drive yourself to the hospital. Summary Hypertension is when the force of blood pumping through your arteries is too strong. If this condition is not controlled, it may put you at risk for serious complications. Your personal target blood pressure may vary depending on your medical conditions,   your age, and other factors. For most people, a normal blood pressure is less than 120/80. Hypertension is managed by lifestyle changes, medicines, or both. Lifestyle changes to help manage hypertension include losing weight, eating a healthy, low-sodium diet, exercising more, stopping smoking, and limiting alcohol. This information is not intended to replace advice given to you by your health care provider. Make sure you discuss any questions you have with your health care  provider. Document Revised: 08/19/2021 Document Reviewed: 08/19/2021 Elsevier Patient Education  2024 Elsevier Inc.  

## 2023-08-28 NOTE — Progress Notes (Deleted)
Brianna Marshall

## 2023-08-28 NOTE — Progress Notes (Signed)
    NURSE VISIT NOTE  Subjective:    Patient ID: Phoua Brelsford, female    DOB: 01/13/1991, 32 y.o.   MRN: [id number]  HPI  Patient is a 32 y.o. G52P2002 female who presents for BP check per order from Paula Compton, CNM.   Patient reports compliance with prescribed BP medications: N/A  Last dose of BP medication:  on 07/31/23 Paula Compton, CNM discontinued paitent on Labetalol 100mg  BID.   BP Readings from Last 3 Encounters:  08/28/23 109/78  08/28/23 109/78  06/26/23 111/76   Pulse Readings from Last 3 Encounters:  08/28/23 97  08/28/23 68  06/26/23 92    Objective:    BP 109/78   Pulse 68   Wt 204 lb 3.2 oz (92.6 kg)   SpO2 97%   BMI 31.05 kg/m   Assessment:   No diagnosis found.   Plan:   Per Dr. Paula Compton, CNM:  Continue to monitor blood pressure at home. Report any reading >140/90 or with any associated symptoms. Return to clinic as scheduled.  Patient verbalized understanding of instructions.   Fonda Kinder, CMA

## 2023-12-28 ENCOUNTER — Other Ambulatory Visit: Payer: Self-pay | Admitting: Internal Medicine

## 2023-12-28 DIAGNOSIS — G43009 Migraine without aura, not intractable, without status migrainosus: Secondary | ICD-10-CM

## 2024-01-01 NOTE — Telephone Encounter (Signed)
 Requested Prescriptions  Pending Prescriptions Disp Refills   topiramate  (TOPAMAX ) 50 MG tablet [Pharmacy Med Name: TOPIRAMATE  50MG  TABLETS] 180 tablet 1    Sig: TAKE 1 TABLET(50 MG) BY MOUTH TWICE DAILY     Neurology: Anticonvulsants - topiramate  & zonisamide Failed - 01/01/2024 11:28 AM      Failed - CO2 in normal range and within 360 days    CO2  Date Value Ref Range Status  05/16/2023 19 (L) 22 - 32 mmol/L Final         Passed - Cr in normal range and within 360 days    Creatinine, Ser  Date Value Ref Range Status  05/16/2023 0.79 0.44 - 1.00 mg/dL Final   Creatinine, Urine  Date Value Ref Range Status  05/14/2023 163 mg/dL Final         Passed - ALT in normal range and within 360 days    ALT  Date Value Ref Range Status  05/16/2023 18 0 - 44 U/L Final         Passed - AST in normal range and within 360 days    AST  Date Value Ref Range Status  05/16/2023 28 15 - 41 U/L Final         Passed - Completed PHQ-2 or PHQ-9 in the last 360 days      Passed - Valid encounter within last 12 months    Recent Outpatient Visits           6 months ago Sore throat   [place] [facility] at [facility], Leita DEL, MD   10 months ago Pharyngitis due to Streptococcus species   [facility] at [facility], Leita DEL, MD   1 year ago Eustachian tube dysfunction, bilateral   [place] [facility] at [facility], Leita DEL, MD   1 year ago Pharyngitis, unspecified etiology   [place] [facility] at [facility], Leita DEL, MD   1 year ago Recurrent major depressive disorder, in partial remission [facility])   [place] [facility] at [facility], Leita DEL, MD

## 2024-02-15 ENCOUNTER — Ambulatory Visit
Admission: EM | Admit: 2024-02-15 | Discharge: 2024-02-15 | Disposition: A | Discharge status: Home / Self Care | Payer: 59 | Attending: Emergency Medicine | Admitting: Emergency Medicine

## 2024-02-15 ENCOUNTER — Encounter: Payer: Self-pay | Admitting: Emergency Medicine

## 2024-02-15 ENCOUNTER — Ambulatory Visit (INDEPENDENT_AMBULATORY_CARE_PROVIDER_SITE_OTHER): Payer: 59

## 2024-02-15 DIAGNOSIS — S8001XA Contusion of right knee, initial encounter: Secondary | ICD-10-CM | POA: Diagnosis not present

## 2024-02-15 DIAGNOSIS — M25561 Pain in right knee: Secondary | ICD-10-CM

## 2024-02-15 NOTE — ED Provider Notes (Addendum)
 [facility]    CSN: [id number] Arrival date & time: 02/15/24  1042      History   Chief Complaint Chief Complaint  Patient presents with   Fall    HPI Brianna Marshall is a 33 y.o. female.   HPI  33 year old female with past medical history significant for PCOS, asthma, elevated blood pressure without diagnosis of hypertension, prediabetes, and depression presents for evaluation of pain in her right knee.  She reports that she tripped over a baby gate last night and landed on her knee on a vinyl floor.  She had the exact same injury in May of last year but was not evaluated because she began having contractions and was 8 months pregnant.  She reports that she does not have pain with weightbearing but she does have pain with range of motion of the knee during ambulation and when transitioning from sitting to standing.  No numbness or tingling in her right lower extremity or foot.  Past Medical History:  Diagnosis Date   Anemia in pregnancy 05/30/2018   Antepartum mild preeclampsia, third trimester 07/30/2018   Asthma    COVID-19 02/2020   Depression    Elevated blood pressure affecting pregnancy in third trimester, antepartum 05/05/2023   Family history of pancreatic cancer    Gestational diabetes 06/11/2018   Gestational diabetes mellitus (GDM) affecting third pregnancy 07/29/2018   Gestational diabetes mellitus (GDM) controlled on oral hypoglycemic drug 06/11/2018   Headache, migraine 07/14/2015   approx 2x/month   Hyperemesis 06/05/2018   Infertility, anovulation 03/10/2017   Nausea/vomiting in pregnancy 01/17/2018   Obesity    PCOS (polycystic ovarian syndrome) 07/08/2016   Pre-eclampsia, severe, delivered 07/30/2018   Prediabetes 07/08/2016   Hgb 5.8  04/2015    Supervision of high risk pregnancy, antepartum 01/02/2018   Clinic [facility] Prenatal Labs Dating Early [place] Blood type: O/Positive/-- (01/29 1038)  Genetic Screen 1 Screen:  Neg Antibody:Negative (01/29 1038) Anatomic [place] Normal, anterior placenta,  Rubella: <0.90 (01/29 1038) Varicella: Imm GTT Early: 123 Third trimester:  RPR: Non Reactive (01/29 1038)  Rhogam n/a HBsAg: Negative (01/29 1038)  TDaP vaccine        05/30/2018    Flu Shot: 09/2017 HIV: Non Reac   Vertigo    for short time after covid   Vitamin D deficiency 07/08/2016   18.7 in 04/2015     Patient Active Problem List   Diagnosis Date Noted   IUD (intrauterine device) in place 06/26/2023   Migraine without aura and without status migrainosus, not intractable 12/17/2019   Sleep disorder 12/17/2019   Alopecia 12/17/2019   Insulin resistance 01/02/2018   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   PCOS (polycystic ovarian syndrome) 07/08/2016   Prediabetes 07/08/2016   Vitamin D deficiency 07/08/2016    Past Surgical History:  Procedure Laterality Date   ENDOSCOPIC CONCHA BULLOSA RESECTION Right 12/30/2021   Procedure: RIGHT CONCHA BULLOSA REDUCTION VIA NASAL ENDOSCOPY;  Surgeon: Vernie Murders, MD;  Location: [facility];  Service: ENT;  Laterality: Right;   INCISE AND DRAIN ABCESS Right 2011   Posterior Thigh   sebaceous cyst removal  07/2017   Back of neck   SEPTOPLASTY N/A 12/30/2021   Procedure: SEPTOPLASTY;  Surgeon: Vernie Murders, MD;  Location: [facility];  Service: ENT;  Laterality: N/A;   TURBINATE REDUCTION Right 12/30/2021   Procedure: PARTIAL EXCISION RIGHT INFERIOR TURBINATE;  Surgeon: Vernie Murders, MD;  Location: [facility];  Service:  ENT;  Laterality: Right;   WISDOM TOOTH EXTRACTION      OB History     Gravida  2   Para  2   Term  2   Preterm      AB      Living  2      SAB      IAB      Ectopic      Multiple  0   Live Births  2            Home Medications    Prior to Admission medications   Medication Sig Start Date End Date Taking? Authorizing Provider  buPROPion ER (WELLBUTRIN SR) 100 MG 12 hr tablet  Take 100 mg by mouth 2 (two) times daily. 06/24/23   [provider]  ibuprofen (ADVIL) 600 MG tablet Take 1 tablet (600 mg total) by mouth every 6 (six) hours. 05/19/23   Mirna Mires, CNM  levonorgestrel (MIRENA) 20 MCG/DAY IUD 1 each by Intrauterine route once. 06/26/23   [provider]  loratadine (CLARITIN REDITABS) 10 MG dissolvable tablet Take 10 mg by mouth daily.    [provider]  montelukast (SINGULAIR) 10 MG tablet Take 1 tablet (10 mg total) by mouth at bedtime. 06/15/23   Reubin Milan, MD  sertraline (ZOLOFT) 100 MG tablet Take 1.5 tablets (150 mg total) by mouth daily. 04/14/23   Linzie Collin, MD  topiramate (TOPAMAX) 50 MG tablet TAKE 1 TABLET(50 MG) BY MOUTH TWICE DAILY 01/01/24   Reubin Milan, MD    Family History Family History  Problem Relation Age of Onset   Osteoporosis Mother    Alcohol abuse Father    Drug abuse Father    Healthy Sister    ADD / ADHD Brother    OCD Brother    Diabetes Maternal Grandmother    Pancreatic cancer Maternal Grandmother 37   Depression Paternal Grandmother    COPD Paternal Grandmother    Alcohol abuse Maternal Aunt    Bipolar disorder Maternal Aunt    Diabetes Maternal Aunt    Breast cancer Maternal Aunt 20       BRCA neg   Diabetes Maternal Uncle    Prostate cancer Maternal Uncle     Social History Social History   Tobacco Use   Smoking status: Never   Smokeless tobacco: Never  Vaping Use   Vaping status: Never Used  Substance Use Topics   Alcohol use: Not Currently    Alcohol/week: 0.0 standard drinks of alcohol    Comment: rarely   Drug use: No     Allergies   Shellfish allergy, Latex, Amoxicillin, and Lidocaine   Review of Systems Review of Systems  Musculoskeletal:  Positive for arthralgias and joint swelling.  Skin:  Negative for color change.     Physical Exam Triage Vital Signs ED Triage Vitals  Encounter Vitals Group     BP      Systolic BP Percentile       Diastolic BP Percentile      Pulse      Resp      Temp      Temp src      SpO2      Weight      Height      Head Circumference      Peak Flow      Pain Score      Pain Loc      Pain Education  Exclude from Growth Chart    No data found.  Updated Vital Signs BP 124/82 (BP Location: Left Arm)   Pulse 89   Temp 98.5 F (36.9 C)   Resp 18   SpO2 97%   Breastfeeding No   Visual Acuity Right Eye Distance:   Left Eye Distance:   Bilateral Distance:    Right Eye Near:   Left Eye Near:    Bilateral Near:     Physical Exam Vitals and nursing note reviewed.  Constitutional:      Appearance: Normal appearance. She is not ill-appearing.  HENT:     Head: Normocephalic and atraumatic.  Musculoskeletal:        General: Swelling, tenderness and signs of injury present. No deformity. Normal range of motion.  Skin:    General: Skin is warm and dry.     Capillary Refill: Capillary refill takes less than 2 seconds.     Findings: No bruising or erythema.  Neurological:     General: No focal deficit present.     Mental Status: She is alert and oriented to person, place, and time.      UC Treatments / Results  Labs (all labs ordered are listed, but only abnormal results are displayed) Labs Reviewed - No data to display  EKG   Radiology No results found.  Procedures Procedures (including critical care time)  Medications Ordered in UC Medications - No data to display  Initial Impression / Assessment and Plan / UC Course  I have reviewed the triage vital signs and the nursing notes.  Pertinent labs & imaging results that were available during my care of the patient were reviewed by me and considered in my medical decision making (see chart for details).   Patient is a nontoxic-appearing 33 year old female presenting for evaluation of right knee pain as outlined HPI above.  As you can see the image above, the knee is in normal anatomical alignment, however,  there is swelling to the inferior and lateral aspect of the knee as well as over the tibial tuberosity.  The patient is tender to palpation over her patella and over the tibial tuberosity.  No pain with palpation of medial or lateral joint line, palpation of popliteal space, or palpation of quadriceps complex.  No pain with varus or valgus stress application.  Anterior and posterior drawer test is negative.  I suspect the patient has sustained a contusion of her knee, however, I will obtain a radiograph to rule out any bony abnormality.  Right knee x-rays independently reviewed and evaluated by me.  Impression: No evidence of fracture or dislocation.  No evidence of degeneration.  Mild narrowing of the medial compartment.  Radiology report is pending. Impression states negative exam.  I will discharge patient on the diagnosis of right knee contusion and have staff fit patient with a hinged knee brace for support and have her wear for the next 7 to 10 days.  She should keep her right knee elevated is much as possible to decrease swelling and aid in pain relief.  She should also apply ice to her knee for 20 weeks at a time, 2-3 times a day as needed for pain or swelling.  The patient is not breast-feeding so she may use over-the-counter Tylenol and/or ibuprofen as needed for any pain.  If her symptoms not improve I recommend she follow-up with orthopedics.  Final Clinical Impressions(s) / UC Diagnoses   Final diagnoses:  Acute pain of right knee  Contusion  of right knee, initial encounter     Discharge Instructions      Your x-rays did not show any evidence of broken or dislocated bones and I do not appreciate any fluid collection in your knee.  I suspect that you have bruised your knee as a result of your fall.  Wear the hinged knee brace that we have provided you to help provide support and prevent further injury.  I would not wear it for the next week to 10 days until the pain and inflammation  has improved.  Keep your right knee elevated is much as possible to help decrease pain and swelling.  You may apply ice to your knee for 20 minutes at a time, 2-3 times a day, as needed for pain and swelling.  Make sure you have a cloth between the ice and your knee so as to not cause skin damage.  You may use over-the-counter Tylenol and/or ibuprofen according the package instructions as needed for any pain or inflammation.  If after 7 to 10 days your symptoms have not improved, or they worsen, I recommend that you follow-up with orthopedics, such as [facility] here in [place] or in [place].     ED Prescriptions   None    PDMP not reviewed this encounter.   Becky Augusta, NP 02/15/24 1201    Becky Augusta, NP 02/15/24 [phone]

## 2024-02-15 NOTE — Discharge Instructions (Addendum)
 Your x-rays did not show any evidence of broken or dislocated bones and I do not appreciate any fluid collection in your knee.  I suspect that you have bruised your knee as a result of your fall.  Wear the hinged knee brace that we have provided you to help provide support and prevent further injury.  I would not wear it for the next week to 10 days until the pain and inflammation has improved.  Keep your right knee elevated is much as possible to help decrease pain and swelling.  You may apply ice to your knee for 20 minutes at a time, 2-3 times a day, as needed for pain and swelling.  Make sure you have a cloth between the ice and your knee so as to not cause skin damage.  You may use over-the-counter Tylenol and/or ibuprofen according the package instructions as needed for any pain or inflammation.  If after 7 to 10 days your symptoms have not improved, or they worsen, I recommend that you follow-up with orthopedics, such as [facility] here in [place] or in [place].

## 2024-02-15 NOTE — ED Triage Notes (Signed)
 Patient stats she fell over a baby gate last night and is c/o right knee discomfort.

## 2024-03-02 ENCOUNTER — Encounter: Payer: Self-pay | Admitting: Emergency Medicine

## 2024-03-02 ENCOUNTER — Ambulatory Visit
Admission: EM | Admit: 2024-03-02 | Discharge: 2024-03-02 | Disposition: A | Discharge status: Home / Self Care | Attending: Emergency Medicine | Admitting: Emergency Medicine

## 2024-03-02 DIAGNOSIS — J029 Acute pharyngitis, unspecified: Secondary | ICD-10-CM | POA: Insufficient documentation

## 2024-03-02 LAB — GROUP A STREP BY PCR: Group A Strep by PCR: NOT DETECTED

## 2024-03-02 NOTE — Discharge Instructions (Addendum)
 Your strep test was negative.  On exam your throat looks good.  I recommend trying ibuprofen and Tylenol alternated for discomfort.  You can also use salt water gargles, lozenges, throat sprays, honey.  Monitor symptoms for a few days.  If pain persists or worsens despite symptomatic care I do recommend returning for re-testing

## 2024-03-02 NOTE — ED Provider Notes (Signed)
 [facility]    CSN: [id number] Arrival date & time: 03/02/24  1137     History   Chief Complaint Chief Complaint  Patient presents with   Sore Throat    HPI Brianna Marshall is a 33 y.o. female.  Last night/early this morning developed sore throat and fatigue Current pain rated 3/10 with swallowing No known fevers.  Denies cough or rash. Her son tested positive for strep 2 days ago  Past Medical History:  Diagnosis Date   Anemia in pregnancy 05/30/2018   Antepartum mild preeclampsia, third trimester 07/30/2018   Asthma    COVID-19 02/2020   Depression    Elevated blood pressure affecting pregnancy in third trimester, antepartum 05/05/2023   Family history of pancreatic cancer    Gestational diabetes 06/11/2018   Gestational diabetes mellitus (GDM) affecting third pregnancy 07/29/2018   Gestational diabetes mellitus (GDM) controlled on oral hypoglycemic drug 06/11/2018   Headache, migraine 07/14/2015   approx 2x/month   Hyperemesis 06/05/2018   Infertility, anovulation 03/10/2017   Nausea/vomiting in pregnancy 01/17/2018   Obesity    PCOS (polycystic ovarian syndrome) 07/08/2016   Pre-eclampsia, severe, delivered 07/30/2018   Prediabetes 07/08/2016   Hgb 5.8  04/2015    Supervision of high risk pregnancy, antepartum 01/02/2018   [facility] Prenatal Labs Dating Early [place] Blood type: O/Positive/-- (01/29 1038)  Genetic Screen 1 Screen: Neg Antibody:Negative (01/29 1038) Anatomic [place] Normal, anterior placenta,  Rubella: <0.90 (01/29 1038) Varicella: Imm GTT Early: 123 Third trimester:  RPR: Non Reactive (01/29 1038)  Rhogam n/a HBsAg: Negative (01/29 1038)  TDaP vaccine        05/30/2018    Flu Shot: 09/2017 HIV: Non Reac   Vertigo    for short time after covid   Vitamin D deficiency 07/08/2016   18.7 in 04/2015     Patient Active Problem List   Diagnosis Date Noted   IUD (intrauterine device) in place 06/26/2023   Migraine without aura  and without status migrainosus, not intractable 12/17/2019   Sleep disorder 12/17/2019   Alopecia 12/17/2019   Insulin resistance 01/02/2018   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   PCOS (polycystic ovarian syndrome) 07/08/2016   Prediabetes 07/08/2016   Vitamin D deficiency 07/08/2016    Past Surgical History:  Procedure Laterality Date   ENDOSCOPIC CONCHA BULLOSA RESECTION Right 12/30/2021   Procedure: RIGHT CONCHA BULLOSA REDUCTION VIA NASAL ENDOSCOPY;  Surgeon: Vernie Murders, MD;  Location: [facility];  Service: ENT;  Laterality: Right;   INCISE AND DRAIN ABCESS Right 2011   Posterior Thigh   sebaceous cyst removal  07/2017   Back of neck   SEPTOPLASTY N/A 12/30/2021   Procedure: SEPTOPLASTY;  Surgeon: Vernie Murders, MD;  Location: [facility];  Service: ENT;  Laterality: N/A;   TURBINATE REDUCTION Right 12/30/2021   Procedure: PARTIAL EXCISION RIGHT INFERIOR TURBINATE;  Surgeon: Vernie Murders, MD;  Location: [facility];  Service: ENT;  Laterality: Right;   WISDOM TOOTH EXTRACTION      OB History     Gravida  2   Para  2   Term  2   Preterm      AB      Living  2      SAB      IAB      Ectopic      Multiple  0   Live Births  2  Home Medications    Prior to Admission medications   Medication Sig Start Date End Date Taking? Authorizing Provider  buPROPion ER (WELLBUTRIN SR) 100 MG 12 hr tablet Take 100 mg by mouth 2 (two) times daily. 06/24/23   [provider]  ibuprofen (ADVIL) 600 MG tablet Take 1 tablet (600 mg total) by mouth every 6 (six) hours. 05/19/23   Mirna Mires, CNM  levonorgestrel (MIRENA) 20 MCG/DAY IUD 1 each by Intrauterine route once. 06/26/23   [provider]  loratadine (CLARITIN REDITABS) 10 MG dissolvable tablet Take 10 mg by mouth daily.    [provider]  montelukast (SINGULAIR) 10 MG tablet Take 1 tablet (10 mg total) by mouth at bedtime. 06/15/23    Reubin Milan, MD  sertraline (ZOLOFT) 100 MG tablet Take 1.5 tablets (150 mg total) by mouth daily. 04/14/23   Linzie Collin, MD  topiramate (TOPAMAX) 50 MG tablet TAKE 1 TABLET(50 MG) BY MOUTH TWICE DAILY 01/01/24   Reubin Milan, MD    Family History Family History  Problem Relation Age of Onset   Osteoporosis Mother    Alcohol abuse Father    Drug abuse Father    Healthy Sister    ADD / ADHD Brother    OCD Brother    Diabetes Maternal Grandmother    Pancreatic cancer Maternal Grandmother 13   Depression Paternal Grandmother    COPD Paternal Grandmother    Alcohol abuse Maternal Aunt    Bipolar disorder Maternal Aunt    Diabetes Maternal Aunt    Breast cancer Maternal Aunt 17       BRCA neg   Diabetes Maternal Uncle    Prostate cancer Maternal Uncle     Social History Social History   Tobacco Use   Smoking status: Never   Smokeless tobacco: Never  Vaping Use   Vaping status: Never Used  Substance Use Topics   Alcohol use: Not Currently    Alcohol/week: 0.0 standard drinks of alcohol    Comment: rarely   Drug use: No     Allergies   Shellfish allergy, Latex, Amoxicillin, and Lidocaine   Review of Systems Review of Systems Per HPI  Physical Exam Triage Vital Signs ED Triage Vitals  Encounter Vitals Group     BP 03/02/24 1242 (!) 120/91     Systolic BP Percentile --      Diastolic BP Percentile --      Pulse Rate 03/02/24 1242 85     Resp 03/02/24 1242 14     Temp 03/02/24 1242 98.4 F (36.9 C)     Temp Source 03/02/24 1242 Oral     SpO2 03/02/24 1242 100 %     Weight 03/02/24 1240 204 lb 2.3 oz (92.6 kg)     Height 03/02/24 1240 5\' 8"  (1.727 m)     Head Circumference --      Peak Flow --      Pain Score 03/02/24 1240 5     Pain Loc --      Pain Education --      Exclude from Growth Chart --    No data found.  Updated Vital Signs BP (!) 120/91 (BP Location: Left Arm)   Pulse 85   Temp 98.4 F (36.9 C) (Oral)   Resp 14   Ht  5\' 8"  (1.727 m)   Wt 204 lb 2.3 oz (92.6 kg)   SpO2 100%   BMI 31.04 kg/m    Physical  Exam Vitals and nursing note reviewed.  Constitutional:      Appearance: She is not ill-appearing.  HENT:     Nose: No congestion or rhinorrhea.     Mouth/Throat:     Mouth: Mucous membranes are moist.     Pharynx: Oropharynx is clear. Uvula midline. Posterior oropharyngeal erythema present. No oropharyngeal exudate or uvula swelling.     Comments: No tonsils are noted although patient denies surgery. Mildly erythematous pharyngeal arch Eyes:     Conjunctiva/sclera: Conjunctivae normal.  Cardiovascular:     Rate and Rhythm: Normal rate and regular rhythm.     Pulses: Normal pulses.     Heart sounds: Normal heart sounds.  Pulmonary:     Effort: Pulmonary effort is normal.     Breath sounds: Normal breath sounds.  Musculoskeletal:     Cervical back: Normal range of motion. No rigidity or tenderness.  Lymphadenopathy:     Cervical: No cervical adenopathy.  Skin:    General: Skin is warm and dry.  Neurological:     Mental Status: She is alert and oriented to person, place, and time.      UC Treatments / Results  Labs (all labs ordered are listed, but only abnormal results are displayed) Labs Reviewed  GROUP A STREP BY PCR    EKG  Radiology No results found.  Procedures Procedures   Medications Ordered in UC Medications - No data to display  Initial Impression / Assessment and Plan / UC Course  I have reviewed the triage vital signs and the nursing notes.  Pertinent labs & imaging results that were available during my care of the patient were reviewed by me and considered in my medical decision making (see chart for details).  Currently afebrile. Well appearing PCR strep is negative. Discussed with patient this is fairly accurate compared to rapid test. Good throat exam. Recommend monitor symptoms, try ibuprofen/tylenol. Can return if needed  Final Clinical Impressions(s) /  UC Diagnoses   Final diagnoses:  Sore throat     Discharge Instructions      Your strep test was negative.  On exam your throat looks good.  I recommend trying ibuprofen and Tylenol alternated for discomfort.  You can also use salt water gargles, lozenges, throat sprays, honey.  Monitor symptoms for a few days.  If pain persists or worsens despite symptomatic care I do recommend returning for re-testing      ED Prescriptions   None    PDMP not reviewed this encounter.   Marlow Baars, [place] 03/02/24 1321

## 2024-03-02 NOTE — ED Triage Notes (Signed)
 Patient c/o sore throat and fatigue that started yesterday.  Patient states that her son tested positive for strep 2 days ago.  Patient unsure of fevers.

## 2024-04-05 ENCOUNTER — Encounter: Payer: Self-pay | Admitting: Internal Medicine

## 2024-04-05 ENCOUNTER — Ambulatory Visit (INDEPENDENT_AMBULATORY_CARE_PROVIDER_SITE_OTHER): Admitting: Internal Medicine

## 2024-04-05 VITALS — BP 132/86 | HR 104 | Ht 68.0 in | Wt 232.0 lb

## 2024-04-05 DIAGNOSIS — E538 Deficiency of other specified B group vitamins: Secondary | ICD-10-CM | POA: Insufficient documentation

## 2024-04-05 DIAGNOSIS — Z0000 Encounter for general adult medical examination without abnormal findings: Secondary | ICD-10-CM

## 2024-04-05 DIAGNOSIS — E282 Polycystic ovarian syndrome: Secondary | ICD-10-CM

## 2024-04-05 DIAGNOSIS — E559 Vitamin D deficiency, unspecified: Secondary | ICD-10-CM

## 2024-04-05 DIAGNOSIS — M2550 Pain in unspecified joint: Secondary | ICD-10-CM | POA: Insufficient documentation

## 2024-04-05 DIAGNOSIS — J3089 Other allergic rhinitis: Secondary | ICD-10-CM

## 2024-04-05 DIAGNOSIS — G43009 Migraine without aura, not intractable, without status migrainosus: Secondary | ICD-10-CM | POA: Diagnosis not present

## 2024-04-05 DIAGNOSIS — R7303 Prediabetes: Secondary | ICD-10-CM

## 2024-04-05 DIAGNOSIS — F3341 Major depressive disorder, recurrent, in partial remission: Secondary | ICD-10-CM

## 2024-04-05 DIAGNOSIS — Z13220 Encounter for screening for lipoid disorders: Secondary | ICD-10-CM | POA: Diagnosis not present

## 2024-04-05 MED ORDER — TOPIRAMATE 50 MG PO TABS: 50.0000 mg | ORAL_TABLET | Freq: Two times a day (BID) | ORAL | 1 refills | Status: AC

## 2024-04-05 MED ORDER — MONTELUKAST SODIUM 10 MG PO TABS: 10.0000 mg | ORAL_TABLET | Freq: Every day | ORAL | 1 refills | Status: AC

## 2024-04-05 MED ORDER — SUMATRIPTAN SUCCINATE 100 MG PO TABS: ORAL_TABLET | ORAL | 2 refills | Status: AC

## 2024-04-05 NOTE — Assessment & Plan Note (Signed)
 Was on injections x 4 last year then started oral B12 This will be rechecked - may need to do monthly B12 to maintain adequate levels.

## 2024-04-05 NOTE — Assessment & Plan Note (Signed)
 Mild wrist discomfort today without redness or active synovitis Will obtain screening labs for autoimmune disorders

## 2024-04-05 NOTE — Assessment & Plan Note (Signed)
 Routine labs ordered Continue IUD with amenorrhea

## 2024-04-05 NOTE — Telephone Encounter (Signed)
 Please review.  KP

## 2024-04-05 NOTE — Assessment & Plan Note (Signed)
 Not currently supplemented Will check levels and advise

## 2024-04-05 NOTE — Assessment & Plan Note (Signed)
 Managed with diet only Lab Results  Component Value Date   HGBA1C 5.4 12/17/2019

## 2024-04-05 NOTE — Assessment & Plan Note (Signed)
 No recent change in migraine headaches. Headaches respond well to current therapy with Topamax . Will continue regimen;  follow up if worsening.

## 2024-04-05 NOTE — Progress Notes (Signed)
 Date:  04/05/2024   Name:  Brianna Marshall   DOB:  10-14-1991   MRN:  [id number]   Chief Complaint: Annual Exam, Fatigue (Patient has been having extreme fatigue, she said she is feeling the pressure of being a mom and its hard to focus ), and Weight Gain Brianna Marshall Brianna Marshall is a 33 y.o. female who presents today for her Complete Annual Exam. She feels poorly. She reports exercising none. She reports she is sleeping poorly. Breast complaints none. She has multiple complaints - hair thinning, diaphoresis, constipation/diarrhea, fatigue, depression, joint pains - wrists, hips and knees, difficulty losing weight despite poor appetite.  Health Maintenance  Topic Date Due   Pneumococcal Vaccination (2 of 2 - PCV) 10/22/2015   COVID-19 Vaccine (4 - 2024-25 season) 04/21/2024*   Flu Shot  07/19/2024   Pap with HPV screening  12/24/2027   DTaP/Tdap/Td vaccine (4 - Td or Tdap) 03/13/2033   Hepatitis C Screening  Completed   HIV Screening  Completed   HPV Vaccine  Aged Out   Meningitis B Vaccine  Aged Out  *Topic was postponed. The date shown is not the original due date.    Depression        This is a chronic (treated by Psych) problem.  Associated symptoms include fatigue, myalgias and headaches. Migraine  This is a recurrent problem. Pertinent negatives include no abdominal pain, dizziness or nausea. Treatments tried: topamax .    Review of Systems  Constitutional:  Positive for diaphoresis, fatigue and unexpected weight change.  HENT:  Negative for trouble swallowing.   Eyes:  Negative for visual disturbance.  Respiratory:  Negative for chest tightness and shortness of breath.   Cardiovascular:  Negative for chest pain, palpitations and leg swelling.  Gastrointestinal:  Negative for abdominal pain, diarrhea and nausea.  Genitourinary:  Negative for menstrual problem.  Musculoskeletal:  Positive for arthralgias and myalgias. Negative for gait problem and  joint swelling.  Skin:  Negative for rash.       Hair thinning  Allergic/Immunologic: Positive for environmental allergies.  Neurological:  Positive for headaches. Negative for dizziness and light-headedness.  Psychiatric/Behavioral:  Positive for depression, dysphoric mood and sleep disturbance. The patient is nervous/anxious.      Lab Results  Component Value Date   NA 134 (L) 05/16/2023   K 3.7 05/16/2023   CO2 19 (L) 05/16/2023   GLUCOSE 152 (H) 05/16/2023   BUN 17 05/16/2023   CREATININE 0.79 05/16/2023   CALCIUM  8.8 (L) 05/16/2023   EGFR 121 05/10/2023   GFRNONAA >60 05/16/2023   No results found for: "CHOL", "HDL", "LDLCALC", "LDLDIRECT", "TRIG", "CHOLHDL" Lab Results  Component Value Date   TSH 1.530 12/17/2019   Lab Results  Component Value Date   HGBA1C 5.4 12/17/2019   Lab Results  Component Value Date   WBC 11.4 (H) 05/18/2023   HGB 9.6 (L) 05/18/2023   HCT 29.9 (L) 05/18/2023   MCV 84.7 05/18/2023   PLT 331 05/18/2023   Lab Results  Component Value Date   ALT 18 05/16/2023   AST 28 05/16/2023   ALKPHOS 152 (H) 05/16/2023   BILITOT 0.5 05/16/2023   No results found for: "25OHVITD2", "25OHVITD3", "VD25OH"   Patient Active Problem List   Diagnosis Date Noted   B12 deficiency 04/05/2024   Arthralgia of multiple joints 04/05/2024   IUD (intrauterine device) in place 06/26/2023   Migraine without aura and without status migrainosus, not intractable 12/17/2019   Sleep  disorder 12/17/2019   Alopecia 12/17/2019   Insulin resistance 01/02/2018   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   PCOS (polycystic ovarian syndrome) 07/08/2016   Prediabetes 07/08/2016   Vitamin D  deficiency 07/08/2016    Allergies  Allergen Reactions   Shellfish Allergy Nausea And Vomiting and Nausea Only   Latex Dermatitis   Amoxicillin Other (See Comments) and Rash   Lidocaine  Rash    topical    Past Surgical History:  Procedure Laterality Date    ENDOSCOPIC CONCHA BULLOSA RESECTION Right 12/30/2021   Procedure: RIGHT CONCHA BULLOSA REDUCTION VIA NASAL ENDOSCOPY;  Surgeon: Mellody Sprout, MD;  Location: [facility];  Service: ENT;  Laterality: Right;   INCISE AND DRAIN ABCESS Right 2011   Posterior Thigh   sebaceous cyst removal  07/2017   Back of neck   SEPTOPLASTY N/A 12/30/2021   Procedure: SEPTOPLASTY;  Surgeon: Mellody Sprout, MD;  Location: [facility];  Service: ENT;  Laterality: N/A;   TURBINATE REDUCTION Right 12/30/2021   Procedure: PARTIAL EXCISION RIGHT INFERIOR TURBINATE;  Surgeon: Juengel, Paul, MD;  Location: [facility];  Service: ENT;  Laterality: Right;   WISDOM TOOTH EXTRACTION      Social History   Tobacco Use   Smoking status: Never   Smokeless tobacco: Never  Vaping Use   Vaping status: Never Used  Substance Use Topics   Alcohol use: Not Currently    Alcohol/week: 0.0 standard drinks of alcohol    Comment: rarely   Drug use: No     Medication list has been reviewed and updated.  Current Meds  Medication Sig   buPROPion ER (WELLBUTRIN SR) 100 MG 12 hr tablet Take 100 mg by mouth 2 (two) times daily.   ibuprofen  (ADVIL ) 600 MG tablet Take 1 tablet (600 mg total) by mouth every 6 (six) hours.   levonorgestrel  (MIRENA ) 20 MCG/DAY IUD 1 each by Intrauterine route once.   loratadine (CLARITIN REDITABS) 10 MG dissolvable tablet Take 10 mg by mouth daily.   sertraline  (ZOLOFT ) 100 MG tablet Take 1.5 tablets (150 mg total) by mouth daily.   [DISCONTINUED] montelukast  (SINGULAIR ) 10 MG tablet Take 1 tablet (10 mg total) by mouth at bedtime.   [DISCONTINUED] topiramate  (TOPAMAX ) 50 MG tablet TAKE 1 TABLET(50 MG) BY MOUTH TWICE DAILY       04/05/2024    2:03 PM 06/15/2023    9:23 AM 05/29/2023   12:01 PM 02/24/2023    2:12 PM  GAD 7 : Generalized Anxiety Score  Nervous, Anxious, on Edge 2 2 2  0  Control/stop worrying 2 2 1  0  Worry too much - different things 3 1 2 1   Trouble relaxing  3 3 3 1   Restless 3 1 3  0  Easily annoyed or irritable 2 2 3 1   Afraid - awful might happen 2 1 1  0  Total GAD 7 Score 17 12 15 3   Anxiety Difficulty Extremely difficult Somewhat difficult Somewhat difficult Not difficult at all       04/05/2024    2:03 PM 06/15/2023    9:23 AM 05/29/2023   12:01 PM  Depression screen PHQ 2/9  Decreased Interest 1 0 2  Down, Depressed, Hopeless 2 1 1   PHQ - 2 Score 3 1 3   Altered sleeping 3 3 2   Tired, decreased energy 3 3 3   Change in appetite 3 2 3   Feeling bad or failure about yourself  2 2 1   Trouble concentrating 3 2 3  Moving slowly or fidgety/restless 3 1 1   Suicidal thoughts 3 0 0  PHQ-9 Score 23 14 16   Difficult doing work/chores Extremely dIfficult Very difficult Somewhat difficult    BP Readings from Last 3 Encounters:  04/05/24 132/86  03/02/24 (!) 120/91  02/15/24 124/82    Physical Exam Vitals and nursing note reviewed.  Constitutional:      General: She is not in acute distress.    Appearance: She is well-developed.  HENT:     Head: Normocephalic and atraumatic.     Right Ear: Tympanic membrane and ear canal normal.     Left Ear: Tympanic membrane and ear canal normal.     Nose:     Right Sinus: No maxillary sinus tenderness.     Left Sinus: No maxillary sinus tenderness.  Eyes:     General: No scleral icterus.       Right eye: No discharge.        Left eye: No discharge.     Conjunctiva/sclera: Conjunctivae normal.  Neck:     Thyroid: No thyromegaly.     Vascular: No carotid bruit.  Cardiovascular:     Rate and Rhythm: Normal rate and regular rhythm.     Pulses: Normal pulses.     Heart sounds: Normal heart sounds.  Pulmonary:     Effort: Pulmonary effort is normal. No respiratory distress.     Breath sounds: No wheezing.  Abdominal:     General: Bowel sounds are normal.     Palpations: Abdomen is soft.     Tenderness: There is no abdominal tenderness.  Musculoskeletal:     Right wrist: Tenderness  present. No swelling or effusion.     Left wrist: Tenderness present. No swelling or effusion.     Cervical back: Normal range of motion. No erythema.     Right hip: Normal range of motion.     Left hip: Normal range of motion.     Right lower leg: No edema.     Left lower leg: No edema.  Lymphadenopathy:     Cervical: No cervical adenopathy.  Skin:    General: Skin is warm and dry.     Findings: No rash.  Neurological:     Mental Status: She is alert and oriented to person, place, and time.     Cranial Nerves: No cranial nerve deficit.     Sensory: No sensory deficit.     Motor: Motor function is intact.     Coordination: Coordination is intact.     Deep Tendon Reflexes: Reflexes are normal and symmetric.  Psychiatric:        Attention and Perception: Attention normal.        Mood and Affect: Mood is depressed.        Speech: Speech normal.        Behavior: Behavior normal.        Cognition and Memory: Cognition normal.        Judgment: Judgment normal.     Wt Readings from Last 3 Encounters:  04/05/24 232 lb (105.2 kg)  03/02/24 204 lb 2.3 oz (92.6 kg)  08/28/23 204 lb 3.2 oz (92.6 kg)    BP 132/86   Pulse (!) 104   Ht 5\' 8"  (1.727 m)   Wt 232 lb (105.2 kg)   LMP  (LMP Unknown)   SpO2 96%   Breastfeeding No   BMI 35.28 kg/m   Assessment and Plan:  Problem List Items Addressed This Visit  Unprioritized   PCOS (polycystic ovarian syndrome)   Routine labs ordered Continue IUD with amenorrhea      Relevant Orders   Comprehensive metabolic panel with GFR   Hemoglobin A1c   Prediabetes   Managed with diet only Lab Results  Component Value Date   HGBA1C 5.4 12/17/2019         Relevant Orders   Hemoglobin A1c   Vitamin D  deficiency   Not currently supplemented Will check levels and advise      Relevant Orders   VITAMIN D  25 Hydroxy (Vit-D Deficiency, Fractures)   Major depression in partial remission (HCC) (Chronic)   Depression poorly  controlled - managed by Psych Recommend follow up to discuss medication adjustment      Relevant Orders   TSH + free T4   Migraine without aura and without status migrainosus, not intractable (Chronic)   No recent change in migraine headaches. Headaches respond well to current therapy with Topamax . Will continue regimen;  follow up if worsening.       Relevant Medications   topiramate  (TOPAMAX ) 50 MG tablet   SUMAtriptan  (IMITREX ) 100 MG tablet   B12 deficiency   Was on injections x 4 last year then started oral B12 This will be rechecked - may need to do monthly B12 to maintain adequate levels.      Relevant Orders   CBC with Differential/Platelet   Vitamin B12   Arthralgia of multiple joints   Mild wrist discomfort today without redness or active synovitis Will obtain screening labs for autoimmune disorders      Relevant Orders   ANA w/Reflex if Positive   Sedimentation rate   Rheumatoid factor   Other Visit Diagnoses       Annual physical exam    -  Primary   up to date on screenings   Relevant Orders   CBC with Differential/Platelet   Comprehensive metabolic panel with GFR   Hemoglobin A1c   Lipid panel   VITAMIN D  25 Hydroxy (Vit-D Deficiency, Fractures)     Screening for lipid disorders       Relevant Orders   Lipid panel     Environmental and seasonal allergies       Relevant Medications   montelukast  (SINGULAIR ) 10 MG tablet       No follow-ups on file.    Sheron Dixons, MD [facility] [place]

## 2024-04-05 NOTE — Assessment & Plan Note (Signed)
 Depression poorly controlled - managed by Psych Recommend follow up to discuss medication adjustment

## 2024-04-06 LAB — LIPID PANEL
Chol/HDL Ratio: 3.9 ratio (ref 0.0–4.4)
Cholesterol, Total: 187 mg/dL (ref 100–199)
HDL: 48 mg/dL (ref >39)
LDL Chol Calc (NIH): 117 mg/dL — ABNORMAL HIGH (ref 0–99)
Triglycerides: 122 mg/dL (ref 0–149)
VLDL Cholesterol Cal: 22 mg/dL (ref 5–40)

## 2024-04-06 LAB — CBC WITH DIFFERENTIAL/PLATELET
Basophils Absolute: 0.1 10*3/uL (ref 0.0–0.2)
Basos: 1 %
EOS (ABSOLUTE): 0.2 10*3/uL (ref 0.0–0.4)
Eos: 2 %
Hematocrit: 42.8 % (ref 34.0–46.6)
Hemoglobin: 13.7 g/dL (ref 11.1–15.9)
Immature Grans (Abs): 0 10*3/uL (ref 0.0–0.1)
Immature Granulocytes: 0 %
Lymphocytes Absolute: 2.1 10*3/uL (ref 0.7–3.1)
Lymphs: 25 %
MCH: 27.7 pg (ref 26.6–33.0)
MCHC: 32 g/dL (ref 31.5–35.7)
MCV: 87 fL (ref 79–97)
Monocytes Absolute: 0.5 10*3/uL (ref 0.1–0.9)
Monocytes: 6 %
Neutrophils Absolute: 5.6 10*3/uL (ref 1.4–7.0)
Neutrophils: 66 %
Platelets: 279 10*3/uL (ref 150–450)
RBC: 4.95 x10E6/uL (ref 3.77–5.28)
RDW: 13.8 % (ref 11.7–15.4)
WBC: 8.5 10*3/uL (ref 3.4–10.8)

## 2024-04-06 LAB — COMPREHENSIVE METABOLIC PANEL WITH GFR
ALT: 16 IU/L (ref 0–32)
AST: 14 IU/L (ref 0–40)
Albumin: 4.6 g/dL (ref 3.9–4.9)
Alkaline Phosphatase: 105 IU/L (ref 44–121)
BUN/Creatinine Ratio: 19 (ref 9–23)
BUN: 16 mg/dL (ref 6–20)
Bilirubin Total: <0.2 mg/dL (ref 0.0–1.2)
CO2: 17 mmol/L — ABNORMAL LOW (ref 20–29)
Calcium: 9.4 mg/dL (ref 8.7–10.2)
Chloride: 106 mmol/L (ref 96–106)
Creatinine, Ser: 0.85 mg/dL (ref 0.57–1.00)
Globulin, Total: 2.2 g/dL (ref 1.5–4.5)
Glucose: 92 mg/dL (ref 70–99)
Potassium: 4.2 mmol/L (ref 3.5–5.2)
Sodium: 138 mmol/L (ref 134–144)
Total Protein: 6.8 g/dL (ref 6.0–8.5)
eGFR: 94 mL/min/{1.73_m2} (ref >59)

## 2024-04-06 LAB — SEDIMENTATION RATE: Sed Rate: 18 mm/h (ref 0–32)

## 2024-04-06 LAB — VITAMIN B12: Vitamin B-12: 774 pg/mL (ref 232–1245)

## 2024-04-06 LAB — HEMOGLOBIN A1C
Est. average glucose Bld gHb Est-mCnc: 114 mg/dL
Hgb A1c MFr Bld: 5.6 % (ref 4.8–5.6)

## 2024-04-06 LAB — TSH+FREE T4
Free T4: 1.32 ng/dL (ref 0.82–1.77)
TSH: 1.9 u[IU]/mL (ref 0.450–4.500)

## 2024-04-06 LAB — ANA W/REFLEX IF POSITIVE: Anti Nuclear Antibody (ANA): NEGATIVE

## 2024-04-06 LAB — VITAMIN D 25 HYDROXY (VIT D DEFICIENCY, FRACTURES): Vit D, 25-Hydroxy: 23.5 ng/mL — ABNORMAL LOW (ref 30.0–100.0)

## 2024-04-06 LAB — RHEUMATOID FACTOR: Rheumatoid fact SerPl-aCnc: <10 [IU]/mL (ref <14.0)

## 2024-04-07 ENCOUNTER — Encounter: Payer: Self-pay | Admitting: Internal Medicine

## 2024-05-21 ENCOUNTER — Ambulatory Visit
Admission: EM | Admit: 2024-05-21 | Discharge: 2024-05-21 | Disposition: A | Discharge status: Home / Self Care | Attending: Physician Assistant | Admitting: Physician Assistant

## 2024-05-21 DIAGNOSIS — R051 Acute cough: Secondary | ICD-10-CM | POA: Insufficient documentation

## 2024-05-21 DIAGNOSIS — J029 Acute pharyngitis, unspecified: Secondary | ICD-10-CM | POA: Insufficient documentation

## 2024-05-21 DIAGNOSIS — J069 Acute upper respiratory infection, unspecified: Secondary | ICD-10-CM | POA: Insufficient documentation

## 2024-05-21 LAB — GROUP A STREP BY PCR: Group A Strep by PCR: NOT DETECTED

## 2024-05-21 NOTE — ED Triage Notes (Signed)
 Sx since Thursday night Son dx with strep this morning  Sore throat  Cough  Sinus congestion

## 2024-05-21 NOTE — ED Provider Notes (Signed)
 [facility]    CSN: [id number] Arrival date & time: 05/21/24  1016      History   Chief Complaint Chief Complaint  Patient presents with   Cough   Sore Throat    HPI Brianna Marshall is a 33 y.o. female presenting for 4 to 5-day history of sore throat, congestion and cough.  Denies fever, chest pain, shortness of breath, vomiting or diarrhea.  Her son has been sick with similar symptoms and was diagnosed with strep this morning.  She says her sore throat has improved a little today.  She has been taking over-the-counter decongestants/cough medicines, Tylenol  and ibuprofen .  She would like to be tested for strep today.  No other complaints.  HPI  Past Medical History:  Diagnosis Date   Anemia in pregnancy 05/30/2018   Antepartum mild preeclampsia, third trimester 07/30/2018   Asthma    COVID-19 02/2020   Depression    Elevated blood pressure affecting pregnancy in third trimester, antepartum 05/05/2023   Family history of pancreatic cancer    Gestational diabetes 06/11/2018   Gestational diabetes mellitus (GDM) affecting third pregnancy 07/29/2018   Gestational diabetes mellitus (GDM) controlled on oral hypoglycemic drug 06/11/2018   Headache, migraine 07/14/2015   approx 2x/month   Hyperemesis 06/05/2018   Infertility, anovulation 03/10/2017   Nausea/vomiting in pregnancy 01/17/2018   Obesity    PCOS (polycystic ovarian syndrome) 07/08/2016   Pre-eclampsia, severe, delivered 07/30/2018   Prediabetes 07/08/2016   Hgb 5.8  04/2015    Supervision of high risk pregnancy, antepartum 01/02/2018   Clinic [facility] Prenatal Labs Dating Early US  Blood type: O/Positive/-- (01/29 1038)  Genetic Screen 1 Screen: Neg Antibody:Negative (01/29 1038) Anatomic US  Normal, anterior placenta,  Rubella: <0.90 (01/29 1038) Varicella: Imm GTT Early: 123 Third trimester:  RPR: Non Reactive (01/29 1038)  Rhogam n/a HBsAg: Negative (01/29 1038)  TDaP vaccine         05/30/2018    Flu Shot: 09/2017 HIV: Non Reac   Vertigo    for short time after covid   Vitamin D  deficiency 07/08/2016   18.7 in 04/2015     Patient Active Problem List   Diagnosis Date Noted   B12 deficiency 04/05/2024   Arthralgia of multiple joints 04/05/2024   IUD (intrauterine device) in place 06/26/2023   Migraine without aura and without status migrainosus, not intractable 12/17/2019   Sleep disorder 12/17/2019   Alopecia 12/17/2019   Insulin resistance 01/02/2018   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   PCOS (polycystic ovarian syndrome) 07/08/2016   Prediabetes 07/08/2016   Vitamin D  deficiency 07/08/2016    Past Surgical History:  Procedure Laterality Date   ENDOSCOPIC CONCHA BULLOSA RESECTION Right 12/30/2021   Procedure: RIGHT CONCHA BULLOSA REDUCTION VIA NASAL ENDOSCOPY;  Surgeon: Mellody Sprout, MD;  Location: [facility];  Service: ENT;  Laterality: Right;   INCISE AND DRAIN ABCESS Right 2011   Posterior Thigh   sebaceous cyst removal  07/2017   Back of neck   SEPTOPLASTY N/A 12/30/2021   Procedure: SEPTOPLASTY;  Surgeon: Mellody Sprout, MD;  Location: [facility];  Service: ENT;  Laterality: N/A;   TURBINATE REDUCTION Right 12/30/2021   Procedure: PARTIAL EXCISION RIGHT INFERIOR TURBINATE;  Surgeon: Juengel, Paul, MD;  Location: [facility];  Service: ENT;  Laterality: Right;   WISDOM TOOTH EXTRACTION      OB History     Gravida  2   Para  2   Term  2   Preterm      AB      Living  2      SAB      IAB      Ectopic      Multiple  0   Live Births  2            Home Medications    Prior to Admission medications   Medication Sig Start Date End Date Taking? Authorizing Provider  buPROPion ER (WELLBUTRIN SR) 100 MG 12 hr tablet Take 100 mg by mouth 2 (two) times daily. 06/24/23  Yes [provider]  levonorgestrel  (MIRENA ) 20 MCG/DAY IUD 1 each by Intrauterine route once. 06/26/23  Yes  [provider]  loratadine (CLARITIN REDITABS) 10 MG dissolvable tablet Take 10 mg by mouth daily.   Yes [provider]  montelukast  (SINGULAIR ) 10 MG tablet Take 1 tablet (10 mg total) by mouth at bedtime. 04/05/24  Yes Sheron Dixons, MD  sertraline  (ZOLOFT ) 100 MG tablet Take 1.5 tablets (150 mg total) by mouth daily. 04/14/23  Yes Zenobia Hila, MD  topiramate  (TOPAMAX ) 50 MG tablet Take 1 tablet (50 mg total) by mouth 2 (two) times daily. 04/05/24  Yes Sheron Dixons, MD  ibuprofen  (ADVIL ) 600 MG tablet Take 1 tablet (600 mg total) by mouth every 6 (six) hours. 05/19/23   Alicia Apa, CNM  SUMAtriptan  (IMITREX ) 100 MG tablet TAKE 1 TABLET BY MOUTH EVERY 2 HOURS AS NEEDED FOR MIGRAINE. MAY REPEAT IN 2 HOUR IF HEADACHE PERSISTS OR RECURS 04/05/24   Sheron Dixons, MD    Family History Family History  Problem Relation Age of Onset   Osteoporosis Mother    Alcohol abuse Father    Drug abuse Father    Healthy Sister    ADD / ADHD Brother    OCD Brother    Diabetes Maternal Grandmother    Pancreatic cancer Maternal Grandmother 48   Depression Paternal Grandmother    COPD Paternal Grandmother    Alcohol abuse Maternal Aunt    Bipolar disorder Maternal Aunt    Diabetes Maternal Aunt    Breast cancer Maternal Aunt 1       BRCA neg   Diabetes Maternal Uncle    Prostate cancer Maternal Uncle     Social History Social History   Tobacco Use   Smoking status: Never   Smokeless tobacco: Never  Vaping Use   Vaping status: Never Used  Substance Use Topics   Alcohol use: Not Currently    Alcohol/week: 0.0 standard drinks of alcohol    Comment: rarely   Drug use: No     Allergies   Shellfish allergy, Latex, Amoxicillin, and Lidocaine    Review of Systems Review of Systems  Constitutional:  Negative for chills, diaphoresis, fatigue and fever.  HENT:  Positive for congestion, rhinorrhea and sore throat. Negative for ear pain and sinus pain.    Respiratory:  Positive for cough. Negative for shortness of breath.   Cardiovascular:  Negative for chest pain.  Gastrointestinal:  Negative for abdominal pain, nausea and vomiting.  Musculoskeletal:  Negative for arthralgias and myalgias.  Skin:  Negative for rash.  Neurological:  Negative for weakness and headaches.  Hematological:  Negative for adenopathy.     Physical Exam Triage Vital Signs ED Triage Vitals  Encounter Vitals Group     BP      Systolic BP Percentile      Diastolic BP Percentile  Pulse      Resp      Temp      Temp src      SpO2      Weight      Height      Head Circumference      Peak Flow      Pain Score      Pain Loc      Pain Education      Exclude from Growth Chart    No data found.  Updated Vital Signs BP (!) 132/91 (BP Location: Right Wrist)   Pulse 89   Temp 98.5 F (36.9 C) (Oral)   Resp 15   SpO2 98%      Physical Exam Vitals and nursing note reviewed.  Constitutional:      General: She is not in acute distress.    Appearance: Normal appearance. She is not ill-appearing or toxic-appearing.  HENT:     Head: Normocephalic and atraumatic.     Nose: Congestion present.     Mouth/Throat:     Mouth: Mucous membranes are moist.     Pharynx: Oropharynx is clear. Posterior oropharyngeal erythema present.  Eyes:     General: No scleral icterus.       Right eye: No discharge.        Left eye: No discharge.     Conjunctiva/sclera: Conjunctivae normal.  Cardiovascular:     Rate and Rhythm: Normal rate and regular rhythm.     Heart sounds: Normal heart sounds.  Pulmonary:     Effort: Pulmonary effort is normal. No respiratory distress.     Breath sounds: Normal breath sounds.  Musculoskeletal:     Cervical back: Neck supple.  Skin:    General: Skin is dry.  Neurological:     General: No focal deficit present.     Mental Status: She is alert. Mental status is at baseline.     Motor: No weakness.     Gait: Gait normal.   Psychiatric:        Mood and Affect: Mood normal.        Behavior: Behavior normal.      UC Treatments / Results  Labs (all labs ordered are listed, but only abnormal results are displayed) Labs Reviewed  GROUP A STREP BY PCR    EKG   Radiology No results found.  Procedures Procedures (including critical care time)  Medications Ordered in UC Medications - No data to display  Initial Impression / Assessment and Plan / UC Course  I have reviewed the triage vital signs and the nursing notes.  Pertinent labs & imaging results that were available during my care of the patient were reviewed by me and considered in my medical decision making (see chart for details).   33 year old female presents for sore throat, congestion and cough for the past 4 to 5 days.  Her son recently tested positive for strep.  She has not had any fevers, difficulty swallowing or shortness of breath.  She is afebrile and overall well-appearing.  On exam has nasal congestion and mild posterior pharyngeal erythema.  Chest is clear.  Heart regular rate and rhythm.    PCR strep performed. Negative.   Viral URI. Supportive care. Patient says she feels like the illness "is on its way out." Advised to continue OTC meds. Reviewed return precautions.   Final Clinical Impressions(s) / UC Diagnoses   Final diagnoses:  Viral upper respiratory tract infection  Sore throat  Acute  cough     Discharge Instructions      -Negative strep  URI/COLD SYMPTOMS: Your exam today is consistent with a viral illness. Antibiotics are not indicated at this time. Use medications as directed, including cough syrup, nasal saline, and decongestants. Your symptoms should improve over the next few days and resolve within 7-10 days. Increase rest and fluids. F/u if symptoms worsen or predominate such as sore throat, ear pain, productive cough, shortness of breath, or if you develop high fevers or worsening fatigue over the next  several days.     ED Prescriptions   None    PDMP not reviewed this encounter.   Floydene Hy, PA-C 05/21/24 1126

## 2024-05-21 NOTE — Discharge Instructions (Addendum)
 -  Negative strep.  URI/COLD SYMPTOMS: Your exam today is consistent with a viral illness. Antibiotics are not indicated at this time. Use medications as directed, including cough syrup, nasal saline, and decongestants. Your symptoms should improve over the next few days and resolve within 7-10 days. Increase rest and fluids. F/u if symptoms worsen or predominate such as sore throat, ear pain, productive cough, shortness of breath, or if you develop high fevers or worsening fatigue over the next several days.

## 2024-05-22 ENCOUNTER — Ambulatory Visit: Admitting: Internal Medicine

## 2024-05-26 ENCOUNTER — Ambulatory Visit
Admission: EM | Admit: 2024-05-26 | Discharge: 2024-05-26 | Disposition: A | Discharge status: Home / Self Care | Attending: Physician Assistant | Admitting: Physician Assistant

## 2024-05-26 ENCOUNTER — Encounter: Payer: Self-pay | Admitting: Emergency Medicine

## 2024-05-26 DIAGNOSIS — J0100 Acute maxillary sinusitis, unspecified: Secondary | ICD-10-CM

## 2024-05-26 MED ORDER — CEFDINIR 300 MG PO CAPS: 300.0000 mg | ORAL_CAPSULE | Freq: Two times a day (BID) | ORAL | 0 refills | Status: AC

## 2024-05-26 NOTE — ED Provider Notes (Signed)
 [facility]    CSN: [id number] Arrival date & time: 05/26/24  1022      History   Chief Complaint Chief Complaint  Patient presents with   Sinus Problem    HPI Brianna Marshall is a 33 y.o. female presenting for 9-10-day history of congestion and cough.  Patient was seen here 5 days ago and had a negative strep test.  Primary complaint at that time was sore throat.  She no longer is having a sore throat.  She was feeling better at her last visit but says over the past 3 days she has developed right-sided sinus pain and right-sided headache.  Reports greenish nasal drainage and feeling worse.  Denies fever, chest pain, shortness of breath, vomiting or diarrhea. She has been taking over-the-counter decongestants/cough medicines, Tylenol  and ibuprofen .  No other complaints.  History of sinus surgery.  HPI  Past Medical History:  Diagnosis Date   Anemia in pregnancy 05/30/2018   Antepartum mild preeclampsia, third trimester 07/30/2018   Asthma    COVID-19 02/2020   Depression    Elevated blood pressure affecting pregnancy in third trimester, antepartum 05/05/2023   Family history of pancreatic cancer    Gestational diabetes 06/11/2018   Gestational diabetes mellitus (GDM) affecting third pregnancy 07/29/2018   Gestational diabetes mellitus (GDM) controlled on oral hypoglycemic drug 06/11/2018   Headache, migraine 07/14/2015   approx 2x/month   Hyperemesis 06/05/2018   Infertility, anovulation 03/10/2017   Nausea/vomiting in pregnancy 01/17/2018   Obesity    PCOS (polycystic ovarian syndrome) 07/08/2016   Pre-eclampsia, severe, delivered 07/30/2018   Prediabetes 07/08/2016   Hgb 5.8  04/2015    Supervision of high risk pregnancy, antepartum 01/02/2018   Clinic [facility] Prenatal Labs Dating Early US  Blood type: O/Positive/-- (01/29 1038)  Genetic Screen 1 Screen: Neg Antibody:Negative (01/29 1038) Anatomic US  Normal, anterior placenta,  Rubella: <0.90  (01/29 1038) Varicella: Imm GTT Early: 123 Third trimester:  RPR: Non Reactive (01/29 1038)  Rhogam n/a HBsAg: Negative (01/29 1038)  TDaP vaccine        05/30/2018    Flu Shot: 09/2017 HIV: Non Reac   Vertigo    for short time after covid   Vitamin D  deficiency 07/08/2016   18.7 in 04/2015     Patient Active Problem List   Diagnosis Date Noted   B12 deficiency 04/05/2024   Arthralgia of multiple joints 04/05/2024   IUD (intrauterine device) in place 06/26/2023   Migraine without aura and without status migrainosus, not intractable 12/17/2019   Sleep disorder 12/17/2019   Alopecia 12/17/2019   Insulin resistance 01/02/2018   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   PCOS (polycystic ovarian syndrome) 07/08/2016   Prediabetes 07/08/2016   Vitamin D  deficiency 07/08/2016    Past Surgical History:  Procedure Laterality Date   ENDOSCOPIC CONCHA BULLOSA RESECTION Right 12/30/2021   Procedure: RIGHT CONCHA BULLOSA REDUCTION VIA NASAL ENDOSCOPY;  Surgeon: Mellody Sprout, MD;  Location: [facility];  Service: ENT;  Laterality: Right;   INCISE AND DRAIN ABCESS Right 2011   Posterior Thigh   sebaceous cyst removal  07/2017   Back of neck   SEPTOPLASTY N/A 12/30/2021   Procedure: SEPTOPLASTY;  Surgeon: Mellody Sprout, MD;  Location: [facility];  Service: ENT;  Laterality: N/A;   TURBINATE REDUCTION Right 12/30/2021   Procedure: PARTIAL EXCISION RIGHT INFERIOR TURBINATE;  Surgeon: Juengel, Paul, MD;  Location: [facility];  Service: ENT;  Laterality: Right;  WISDOM TOOTH EXTRACTION      OB History     Gravida  2   Para  2   Term  2   Preterm      AB      Living  2      SAB      IAB      Ectopic      Multiple  0   Live Births  2            Home Medications    Prior to Admission medications   Medication Sig Start Date End Date Taking? Authorizing Provider  cefdinir (OMNICEF) 300 MG capsule Take 1 capsule (300 mg total) by  mouth 2 (two) times daily for 7 days. 05/26/24 06/02/24 Yes Floydene Hy, PA-C  buPROPion ER (WELLBUTRIN SR) 100 MG 12 hr tablet Take 100 mg by mouth 2 (two) times daily. 06/24/23   [provider]  ibuprofen  (ADVIL ) 600 MG tablet Take 1 tablet (600 mg total) by mouth every 6 (six) hours. 05/19/23   Alicia Apa, CNM  levonorgestrel  (MIRENA ) 20 MCG/DAY IUD 1 each by Intrauterine route once. 06/26/23   [provider]  loratadine (CLARITIN REDITABS) 10 MG dissolvable tablet Take 10 mg by mouth daily.    [provider]  montelukast  (SINGULAIR ) 10 MG tablet Take 1 tablet (10 mg total) by mouth at bedtime. 04/05/24   Sheron Dixons, MD  sertraline  (ZOLOFT ) 100 MG tablet Take 1.5 tablets (150 mg total) by mouth daily. 04/14/23   Zenobia Hila, MD  SUMAtriptan  (IMITREX ) 100 MG tablet TAKE 1 TABLET BY MOUTH EVERY 2 HOURS AS NEEDED FOR MIGRAINE. MAY REPEAT IN 2 HOUR IF HEADACHE PERSISTS OR RECURS 04/05/24   Sheron Dixons, MD  topiramate  (TOPAMAX ) 50 MG tablet Take 1 tablet (50 mg total) by mouth 2 (two) times daily. 04/05/24   Sheron Dixons, MD    Family History Family History  Problem Relation Age of Onset   Osteoporosis Mother    Alcohol abuse Father    Drug abuse Father    Healthy Sister    ADD / ADHD Brother    OCD Brother    Diabetes Maternal Grandmother    Pancreatic cancer Maternal Grandmother 72   Depression Paternal Grandmother    COPD Paternal Grandmother    Alcohol abuse Maternal Aunt    Bipolar disorder Maternal Aunt    Diabetes Maternal Aunt    Breast cancer Maternal Aunt 69       BRCA neg   Diabetes Maternal Uncle    Prostate cancer Maternal Uncle     Social History Social History   Tobacco Use   Smoking status: Never   Smokeless tobacco: Never  Vaping Use   Vaping status: Never Used  Substance Use Topics   Alcohol use: Not Currently    Alcohol/week: 0.0 standard drinks of alcohol    Comment: rarely   Drug use: No      Allergies   Shellfish allergy, Latex, Amoxicillin, and Lidocaine    Review of Systems Review of Systems  Constitutional:  Positive for fatigue. Negative for chills, diaphoresis and fever.  HENT:  Positive for congestion, rhinorrhea, sinus pressure and sinus pain. Negative for ear pain and sore throat.   Respiratory:  Positive for cough. Negative for shortness of breath.   Cardiovascular:  Negative for chest pain.  Gastrointestinal:  Negative for abdominal pain, nausea and vomiting.  Musculoskeletal:  Negative for arthralgias and myalgias.  Skin:  Negative for rash.  Neurological:  Positive for headaches. Negative for weakness.  Hematological:  Negative for adenopathy.     Physical Exam Triage Vital Signs ED Triage Vitals  Encounter Vitals Group     BP      Systolic BP Percentile      Diastolic BP Percentile      Pulse      Resp      Temp      Temp src      SpO2      Weight      Height      Head Circumference      Peak Flow      Pain Score      Pain Loc      Pain Education      Exclude from Growth Chart    No data found.  Updated Vital Signs BP 136/77 (BP Location: Left Arm)   Pulse 90   Temp 98.9 F (37.2 C) (Oral)   Resp 14   Ht 5\' 8"  (1.727 m)   Wt 231 lb 14.8 oz (105.2 kg)   SpO2 100%   BMI 35.26 kg/m      Physical Exam Vitals and nursing note reviewed.  Constitutional:      General: She is not in acute distress.    Appearance: Normal appearance. She is not ill-appearing or toxic-appearing.  HENT:     Head: Normocephalic and atraumatic.     Right Ear: Ear canal and external ear normal. Tympanic membrane is erythematous.     Left Ear: Tympanic membrane, ear canal and external ear normal.     Nose: Congestion present.     Right Sinus: Maxillary sinus tenderness present.     Mouth/Throat:     Mouth: Mucous membranes are moist.     Pharynx: Oropharynx is clear. Posterior oropharyngeal erythema present.  Eyes:     General: No scleral icterus.        Right eye: No discharge.        Left eye: No discharge.     Conjunctiva/sclera: Conjunctivae normal.  Cardiovascular:     Rate and Rhythm: Normal rate and regular rhythm.     Heart sounds: Normal heart sounds.  Pulmonary:     Effort: Pulmonary effort is normal. No respiratory distress.     Breath sounds: Normal breath sounds.  Musculoskeletal:     Cervical back: Neck supple.  Skin:    General: Skin is dry.  Neurological:     General: No focal deficit present.     Mental Status: She is alert. Mental status is at baseline.     Motor: No weakness.     Gait: Gait normal.  Psychiatric:        Mood and Affect: Mood normal.        Behavior: Behavior normal.      UC Treatments / Results  Labs (all labs ordered are listed, but only abnormal results are displayed) Labs Reviewed - No data to display   EKG   Radiology No results found.  Procedures Procedures (including critical care time)  Medications Ordered in UC Medications - No data to display  Initial Impression / Assessment and Plan / UC Course  I have reviewed the triage vital signs and the nursing notes.  Pertinent labs & imaging results that were available during my care of the patient were reviewed by me and considered in my medical decision making (see chart for details).   33 year old  female presents for congestion and cough for the past 9-10 days.  Seen here 5 days ago for symptoms and was feeling better at that time. Developed right sided maxillary sinus pain 3 days ago. She has not had any fevers, difficulty swallowing or shortness of breath.  She is afebrile and overall well-appearing.  On exam has nasal congestion and right maxillary sinus tenderness.  Chest is clear.  Heart regular rate and rhythm.    Acute sinusitis. Supportive care. Sent cefdinir. Advised to continue OTC meds. Reviewed return precautions.   Final Clinical Impressions(s) / UC Diagnoses   Final diagnoses:  Acute maxillary  sinusitis, recurrence not specified   Discharge Instructions   None     ED Prescriptions     Medication Sig Dispense Auth. Provider   cefdinir (OMNICEF) 300 MG capsule Take 1 capsule (300 mg total) by mouth 2 (two) times daily for 7 days. 14 capsule Floydene Hy, PA-C      PDMP not reviewed this encounter.      Floydene Hy, PA-C 05/26/24 1126

## 2024-05-26 NOTE — ED Triage Notes (Signed)
 Patient c/o sinus congestion and pressure especially on the right side of her face for the past 3-4 days.  Patient was seen here on 05/21/24 for URI symptoms.  Patient states that she did a sinus rinse this morning and developed increase pain on the right side of her facial cheek.

## 2024-07-30 ENCOUNTER — Ambulatory Visit
Admission: RE | Admit: 2024-07-30 | Discharge: 2024-07-30 | Disposition: A | Discharge status: Home / Self Care | Attending: Family Medicine | Admitting: Family Medicine

## 2024-07-30 ENCOUNTER — Encounter: Payer: Self-pay | Admitting: Family Medicine

## 2024-07-30 ENCOUNTER — Ambulatory Visit
Admission: RE | Admit: 2024-07-30 | Discharge: 2024-07-30 | Disposition: A | Discharge status: Home / Self Care | Source: Ambulatory Visit | Attending: Family Medicine | Admitting: Family Medicine

## 2024-07-30 ENCOUNTER — Ambulatory Visit: Admitting: Family Medicine

## 2024-07-30 VITALS — BP 136/84 | HR 119 | Resp 98 | Ht 68.0 in | Wt 230.0 lb

## 2024-07-30 DIAGNOSIS — M25562 Pain in left knee: Secondary | ICD-10-CM

## 2024-07-30 MED ORDER — NAPROXEN 500 MG PO TABS: 500.0000 mg | ORAL_TABLET | Freq: Two times a day (BID) | ORAL | 0 refills | Status: DC

## 2024-07-30 NOTE — Progress Notes (Signed)
   Acute Office Visit  Subjective:     Patient ID: Brianna Marshall, female    DOB: 22-May-1991, 33 y.o.   MRN: [id number]  Chief Complaint  Patient presents with   Knee Pain    X3 weeks, left knee, getting worse, no xrays done, aching, burning, cramping, shooting and stabbing pain, 6 pain scale, hurts with movement, turned around and knee popped     C/o  3 weeks of left knee pain. Happened due to twisting on left knee while at grocery store. Denies fall.  Pain is located over medial surface of left knee. Denies swelling, redness, warmth.  Her pain is worse with ambulation,bending. Improved with elevation and rest.   Patient is in today for   Review of Systems  All other systems reviewed and are negative.       Objective:    BP 136/84   Pulse (!) 119   Resp (!) 98   Ht 5' 8 (1.727 m)   Wt 230 lb (104.3 kg)   Breastfeeding No   BMI 34.97 kg/m    Physical Exam Vitals and nursing note reviewed.  Constitutional:      Appearance: Normal appearance.  HENT:     Head: Normocephalic.     Right Ear: External ear normal.     Left Ear: External ear normal.  Eyes:     Conjunctiva/sclera: Conjunctivae normal.  Cardiovascular:     Rate and Rhythm: Normal rate.  Pulmonary:     Effort: Pulmonary effort is normal. No respiratory distress.  Abdominal:     Palpations: Abdomen is soft.  Musculoskeletal:        General: Tenderness present. Normal range of motion.       Legs:     Comments: Point tenderness over medial left knee. Negative for swelling.  Skin:    General: Skin is warm.  Neurological:     Mental Status: She is alert and oriented to person, place, and time.  Psychiatric:        Mood and Affect: Mood normal.     No results found for any visits on 07/30/24.      Assessment & Plan:   Problem List Items Addressed This Visit   None Visit Diagnoses       Acute pain of left knee    -  Primary   Relevant Medications   naproxen  (NAPROSYN )  500 MG tablet   Other Relevant Orders   DG Knee Complete 4 Views Left     Knee sprain?   Recommend RICE, pain control, bracing.  Ordering Xray to assess further. RTC if no improvement.  Meds ordered this encounter  Medications   naproxen  (NAPROSYN ) 500 MG tablet    Sig: Take 1 tablet (500 mg total) by mouth 2 (two) times daily with a meal.    Dispense:  30 tablet    Refill:  0    No follow-ups on file.  Vinary K Jessi Pitstick, MD

## 2024-08-12 ENCOUNTER — Ambulatory Visit: Payer: Self-pay | Admitting: Family Medicine

## 2024-08-21 ENCOUNTER — Ambulatory Visit: Admitting: Internal Medicine

## 2024-08-21 ENCOUNTER — Encounter: Payer: Self-pay | Admitting: Internal Medicine

## 2024-08-21 VITALS — BP 112/96 | HR 102 | Ht 68.0 in | Wt 229.0 lb

## 2024-08-21 DIAGNOSIS — J4520 Mild intermittent asthma, uncomplicated: Secondary | ICD-10-CM | POA: Insufficient documentation

## 2024-08-21 MED ORDER — AIRSUPRA 90-80 MCG/ACT IN AERO: 2.0000 | INHALATION_SPRAY | Freq: Four times a day (QID) | RESPIRATORY_TRACT | 1 refills | Status: DC | PRN

## 2024-08-21 NOTE — Assessment & Plan Note (Signed)
 Onset in childhood but improved after teen years. Now has mild symptoms triggered by URIs, fumes and odors. Will give Airsupra  to use 2 puff q 4 hrs prn Continue Singulair  and claritin

## 2024-08-21 NOTE — Progress Notes (Signed)
 Date:  08/21/2024   Name:  Brianna Marshall   DOB:  25-Sep-1991   MRN:  [id number]   Chief Complaint: Asthma  Asthma She complains of chest tightness, cough and shortness of breath. This is a new problem. The current episode started 1 to 4 weeks ago. The problem occurs constantly. The problem has been unchanged. The cough is non-productive, dry and paroxysmal. Pertinent negatives include no appetite change, chest pain, fever, sweats or trouble swallowing. Her symptoms are aggravated by URI. Her symptoms are alleviated by nothing. Her past medical history is significant for asthma.    Review of Systems  Constitutional:  Negative for appetite change, fatigue and fever.  HENT:  Negative for trouble swallowing.   Respiratory:  Positive for cough and shortness of breath.   Cardiovascular:  Negative for chest pain and palpitations.  Psychiatric/Behavioral:  Negative for dysphoric mood and sleep disturbance. The patient is not nervous/anxious.      Lab Results  Component Value Date   NA 138 04/05/2024   K 4.2 04/05/2024   CO2 17 (L) 04/05/2024   GLUCOSE 92 04/05/2024   BUN 16 04/05/2024   CREATININE 0.85 04/05/2024   CALCIUM  9.4 04/05/2024   EGFR 94 04/05/2024   GFRNONAA >60 05/16/2023   Lab Results  Component Value Date   CHOL 187 04/05/2024   HDL 48 04/05/2024   LDLCALC 117 (H) 04/05/2024   TRIG 122 04/05/2024   CHOLHDL 3.9 04/05/2024   Lab Results  Component Value Date   TSH 1.900 04/05/2024   Lab Results  Component Value Date   HGBA1C 5.6 04/05/2024   Lab Results  Component Value Date   WBC 8.5 04/05/2024   HGB 13.7 04/05/2024   HCT 42.8 04/05/2024   MCV 87 04/05/2024   PLT 279 04/05/2024   Lab Results  Component Value Date   ALT 16 04/05/2024   AST 14 04/05/2024   ALKPHOS 105 04/05/2024   BILITOT <0.2 04/05/2024   Lab Results  Component Value Date   VD25OH 23.5 (L) 04/05/2024     Patient Active Problem List   Diagnosis Date Noted    Mild intermittent asthma without complication 08/21/2024   B12 deficiency 04/05/2024   Arthralgia of multiple joints 04/05/2024   IUD (intrauterine device) in place 06/26/2023   Migraine without aura and without status migrainosus, not intractable 12/17/2019   Sleep disorder 12/17/2019   Alopecia 12/17/2019   Insulin resistance 01/02/2018   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   PCOS (polycystic ovarian syndrome) 07/08/2016   Prediabetes 07/08/2016   Vitamin D  deficiency 07/08/2016    Allergies  Allergen Reactions   Shellfish Allergy Nausea And Vomiting and Nausea Only   Shellfish-Derived Products Nausea Only    shellfish derived   Amoxicillin Other (See Comments) and Rash    amoxicillin   Latex Dermatitis   Lidocaine  Rash    topical    Past Surgical History:  Procedure Laterality Date   ENDOSCOPIC CONCHA BULLOSA RESECTION Right 12/30/2021   Procedure: RIGHT CONCHA BULLOSA REDUCTION VIA NASAL ENDOSCOPY;  Surgeon: Edda Mt, MD;  Location: [facility];  Service: ENT;  Laterality: Right;   INCISE AND DRAIN ABCESS Right 2011   Posterior Thigh   sebaceous cyst removal  07/2017   Back of neck   SEPTOPLASTY N/A 12/30/2021   Procedure: SEPTOPLASTY;  Surgeon: Edda Mt, MD;  Location: [facility];  Service: ENT;  Laterality: N/A;   TURBINATE REDUCTION Right 12/30/2021  Procedure: PARTIAL EXCISION RIGHT INFERIOR TURBINATE;  Surgeon: Juengel, Paul, MD;  Location: [facility];  Service: ENT;  Laterality: Right;   WISDOM TOOTH EXTRACTION      Social History   Tobacco Use   Smoking status: Never   Smokeless tobacco: Never  Vaping Use   Vaping status: Never Used  Substance Use Topics   Alcohol use: Not Currently    Alcohol/week: 0.0 standard drinks of alcohol    Comment: rarely   Drug use: No     Medication list has been reviewed and updated.  Current Meds  Medication Sig   Albuterol -Budesonide (AIRSUPRA ) 90-80 MCG/ACT AERO  Inhale 2 puffs into the lungs every 6 (six) hours as needed.   buPROPion ER (WELLBUTRIN SR) 100 MG 12 hr tablet Take 100 mg by mouth 2 (two) times daily.   levonorgestrel  (MIRENA ) 20 MCG/DAY IUD 1 each by Intrauterine route once.   loratadine (CLARITIN REDITABS) 10 MG dissolvable tablet Take 10 mg by mouth daily.   montelukast  (SINGULAIR ) 10 MG tablet Take 1 tablet (10 mg total) by mouth at bedtime.   naproxen  (NAPROSYN ) 500 MG tablet Take 1 tablet (500 mg total) by mouth 2 (two) times daily with a meal.   sertraline  (ZOLOFT ) 100 MG tablet Take 1.5 tablets (150 mg total) by mouth daily. (Patient taking differently: Take 200 mg by mouth daily.)   SUMAtriptan  (IMITREX ) 100 MG tablet TAKE 1 TABLET BY MOUTH EVERY 2 HOURS AS NEEDED FOR MIGRAINE. MAY REPEAT IN 2 HOUR IF HEADACHE PERSISTS OR RECURS   topiramate  (TOPAMAX ) 50 MG tablet Take 1 tablet (50 mg total) by mouth 2 (two) times daily.       08/21/2024    2:14 PM 07/30/2024    9:48 AM 04/05/2024    2:03 PM 06/15/2023    9:23 AM  GAD 7 : Generalized Anxiety Score  Nervous, Anxious, on Edge 1 2 2 2   Control/stop worrying 1 2 2 2   Worry too much - different things 1 2 3 1   Trouble relaxing 1 3 3 3   Restless 1 3 3 1   Easily annoyed or irritable 1 2 2 2   Afraid - awful might happen 1 3 2 1   Total GAD 7 Score 7 17 17 12   Anxiety Difficulty Somewhat difficult Somewhat difficult Extremely difficult Somewhat difficult       08/21/2024    2:14 PM 07/30/2024    9:48 AM 04/05/2024    2:03 PM  Depression screen PHQ 2/9  Decreased Interest 0 1 1  Down, Depressed, Hopeless 0 1 2  PHQ - 2 Score 0 2 3  Altered sleeping 1 3 3   Tired, decreased energy 1 3 3   Change in appetite 1 3 3   Feeling bad or failure about yourself  1 1 2   Trouble concentrating 0 2 3  Moving slowly or fidgety/restless 0 3 3  Suicidal thoughts 0 0 3  PHQ-9 Score 4 17 23   Difficult doing work/chores Not difficult at all Somewhat difficult Extremely dIfficult    BP Readings  from Last 3 Encounters:  08/21/24 (!) 112/96  07/30/24 136/84  05/26/24 136/77    Physical Exam Vitals and nursing note reviewed.  Constitutional:      General: She is not in acute distress.    Appearance: Normal appearance. She is well-developed.  HENT:     Head: Normocephalic and atraumatic.  Neck:     Vascular: No carotid bruit.  Cardiovascular:     Rate and Rhythm:  Normal rate and regular rhythm.  Pulmonary:     Effort: Pulmonary effort is normal. No respiratory distress.     Breath sounds: No wheezing or rhonchi.  Musculoskeletal:     Cervical back: Normal range of motion.  Lymphadenopathy:     Cervical: No cervical adenopathy.  Skin:    General: Skin is warm and dry.     Findings: No rash.  Neurological:     Mental Status: She is alert and oriented to person, place, and time.  Psychiatric:        Mood and Affect: Mood normal.        Behavior: Behavior normal.     Wt Readings from Last 3 Encounters:  08/21/24 229 lb (103.9 kg)  07/30/24 230 lb (104.3 kg)  05/26/24 231 lb 14.8 oz (105.2 kg)    BP (!) 112/96   Pulse (!) 102   Ht 5' 8 (1.727 m)   Wt 229 lb (103.9 kg)   SpO2 98%   BMI 34.82 kg/m   Assessment and Plan:  Problem List Items Addressed This Visit       Unprioritized   Mild intermittent asthma without complication - Primary   Onset in childhood but improved after teen years. Now has mild symptoms triggered by URIs, fumes and odors. Will give Airsupra  to use 2 puff q 4 hrs prn Continue Singulair  and claritin      Relevant Medications   Albuterol -Budesonide (AIRSUPRA ) 90-80 MCG/ACT AERO    No follow-ups on file.    Leita HILARIO Adie, MD [facility] [place]

## 2024-08-28 ENCOUNTER — Encounter: Admitting: Family Medicine

## 2024-09-02 ENCOUNTER — Telehealth: Payer: Self-pay | Admitting: Internal Medicine

## 2024-09-02 ENCOUNTER — Ambulatory Visit: Admitting: Family Medicine

## 2024-09-02 NOTE — Telephone Encounter (Signed)
 Copied from CRM #[id number]. Topic: General - Running Late >> Sep 02, 2024 10:38 AM Delon DASEN wrote: Patient/patient representative is calling because they are running late for an appointment. Will try to get there in the 10 minute window.

## 2024-09-02 NOTE — Telephone Encounter (Signed)
 Noted  KP

## 2024-09-04 ENCOUNTER — Encounter: Payer: Self-pay | Admitting: Family Medicine

## 2024-09-04 ENCOUNTER — Ambulatory Visit: Admitting: Family Medicine

## 2024-09-04 VITALS — BP 114/70 | HR 94 | Ht 68.0 in | Wt 231.0 lb

## 2024-09-04 DIAGNOSIS — M2392 Unspecified internal derangement of left knee: Secondary | ICD-10-CM | POA: Diagnosis not present

## 2024-09-04 MED ORDER — MELOXICAM 15 MG PO TABS: 15.0000 mg | ORAL_TABLET | Freq: Every day | ORAL | 0 refills | Status: DC

## 2024-09-04 NOTE — Assessment & Plan Note (Addendum)
 History of Present Illness Brianna Marshall is a 33 year old female who presents with left knee pain following an injury.  Left knee pain and mechanical symptoms - Onset approximately six weeks ago after a sudden twisting injury while turning at a grocery store, associated with a sensation of something 'tore or snapped' - Persistent pain localized to both medial and lateral aspects of the left knee - Medial knee pain is more constant and described as a 'folding' sensation, especially with walking - Lateral knee pain is sharper and occurs only with certain movements - Significant pain with full weight bearing and with full flexion of the knee - No episodes of knee giving way or locking - No swelling, bruising, or black and blue discoloration of the knee since the injury - Unable to participate in rugby or usual workouts due to pain; pool exercises are tolerated better than walking  Prior knee history - No prior history of left knee problems - History of right knee issues in the past  Pain management - Using over-the-counter ibuprofen  and naproxen  for pain control - Despite analgesic use, unable to resume usual physical activities due to persistent pain  Physical Exam INSPECTION: Bilateral knees symmetric, without swelling, erythema, ecchymosis, atrophy, deformity, or skin changes. PALPATION: No palpable effusion on the left knee. Subtle tenderness at the lateral patellar facet, medial patellar facet benign. Nontender quadriceps, patellar tendons, lateral and medial joint lines. RANGE OF MOTION: Knee range of motion 0 to 130 degrees. STRENGTH: Resisted hip flexion elicits anteromedial pain. SPECIAL TESTS: Negative anterior and posterior drawer test on the left. Negative medial lateral McMurray test. Negative varus and valgus stress tests. Negative Lachman's test. Positive Thessaly test.  Assessment and Plan Left knee meniscus injury (sprain or possible small tear) History  and findings raise concern for meniscal involvement. Patellofemoral arthralgia noted as well.  - Prescribed hinged knee brace to offload knee, wear at all times, okay to remove for sleeping - Prescribed meloxicam  15 mg once daily with food for two weeks. - Advised to pause all other NSAIDs like ibuprofen  and naproxen . - Instructed to avoid high-impact activities and rugby until reassessment. - Reassess in two weeks via MyChart message to determine if MRI is needed.  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 X-rays showed slight kneecap misalignment, not primary issue. - Continue with hinged knee brace and meloxicam  as prescribed for meniscus injury. - Monitor symptoms and reassess in two weeks to determine if further imaging or treatment is necessary.

## 2024-09-04 NOTE — Patient Instructions (Signed)
 Patient Plan  Left Knee Meniscus Injury and Patellofemoral Cartilage Inflammation  - Wear the hinged knee brace at all times, except when sleeping. - Take meloxicam  15 mg once daily with food for two weeks. - Do not take other NSAIDs (such as ibuprofen  or naproxen ) while on meloxicam . - Avoid high-impact activities and do not participate in rugby until reassessment. - Monitor symptoms and reassess in two weeks via MyChart message to determine if an MRI is needed.  Red flags - seek care if you notice:  - Sudden or severe knee swelling, locking, or inability to move the knee - Severe pain not relieved by medication - Signs of infection (fever, chills, redness, or warmth at the knee) - New or worsening symptoms that concern you

## 2024-09-04 NOTE — Progress Notes (Signed)
 Primary Care / Sports Medicine Office Visit  Patient Information:  Patient ID: Brianna Marshall, female DOB: [birth year] Age: 33 y.o. MRN: [id number]   Brianna Marshall is a pleasant 33 y.o. female presenting with the following:  Chief Complaint  Patient presents with   Knee Pain    Left knee injury one month ago. Patient was at the grocery store and twisted around and felt a sharp pain as if something tore in her knee. She has been having pain that comes and goes depending on activity. Patient takes tylenol , ibu, and naproxen  for pian. It helps temporarily but does not go away completely. Tender to touch on lateral and medial aspects of knee.     Vitals:   09/04/24 0947  BP: 114/70  Pulse: 94  SpO2: 99%   Vitals:   09/04/24 0947  Weight: 231 lb (104.8 kg)  Height: 5' 8 (1.727 m)   Body mass index is 35.12 kg/m.  No results found.   Discussed the use of AI scribe software for clinical note transcription with the patient, who gave verbal consent to proceed.   Independent interpretation of notes and tests performed by another provider:   Results RADIOLOGY Knee X-ray: Preserved tricompartmental joint space, subtle lateral patellar position on AP view, no tilt on sunrise, no acute osseous abnormalities  Procedures performed:   None  Pertinent History, Exam, Impression, and Recommendations:   Problem List Items Addressed This Visit     Internal derangement of left knee - Primary   History of Present Illness Brianna Marshall is a 33 year old female who presents with left knee pain following an injury.  Left knee pain and mechanical symptoms - Onset approximately six weeks ago after a sudden twisting injury while turning at a grocery store, associated with a sensation of something 'tore or snapped' - Persistent pain localized to both medial and lateral aspects of the left knee - Medial knee pain is more constant and  described as a 'folding' sensation, especially with walking - Lateral knee pain is sharper and occurs only with certain movements - Significant pain with full weight bearing and with full flexion of the knee - No episodes of knee giving way or locking - No swelling, bruising, or black and blue discoloration of the knee since the injury - Unable to participate in rugby or usual workouts due to pain; pool exercises are tolerated better than walking  Prior knee history - No prior history of left knee problems - History of right knee issues in the past  Pain management - Using over-the-counter ibuprofen  and naproxen  for pain control - Despite analgesic use, unable to resume usual physical activities due to persistent pain  Physical Exam INSPECTION: Bilateral knees symmetric, without swelling, erythema, ecchymosis, atrophy, deformity, or skin changes. PALPATION: No palpable effusion on the left knee. Subtle tenderness at the lateral patellar facet, medial patellar facet benign. Nontender quadriceps, patellar tendons, lateral and medial joint lines. RANGE OF MOTION: Knee range of motion 0 to 130 degrees. STRENGTH: Resisted hip flexion elicits anteromedial pain. SPECIAL TESTS: Negative anterior and posterior drawer test on the left. Negative medial lateral McMurray test. Negative varus and valgus stress tests. Negative Lachman's test. Positive Thessaly test.  Assessment and Plan Left knee meniscus injury (sprain or possible small tear) History and findings raise concern for meniscal involvement. Patellofemoral arthralgia noted as well.  - Prescribed hinged knee brace to offload knee, wear at all times, okay to  remove for sleeping - Prescribed meloxicam  15 mg once daily with food for two weeks. - Advised to pause all other NSAIDs like ibuprofen  and naproxen . - Instructed to avoid high-impact activities and rugby until reassessment. - Reassess in two weeks via MyChart message to determine if MRI  is needed.  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 X-rays showed slight kneecap misalignment, not primary issue. - Continue with hinged knee brace and meloxicam  as prescribed for meniscus injury. - Monitor symptoms and reassess in two weeks to determine if further imaging or treatment is necessary.        Orders & Medications Medications:  Meds ordered this encounter  Medications   meloxicam  (MOBIC ) 15 MG tablet    Sig: Take 1 tablet (15 mg total) by mouth daily. X 2 weeks then daily PRN. Take with food.    Dispense:  30 tablet    Refill:  0   No orders of the defined types were placed in this encounter.    No follow-ups on file.     Brianna JINNY Ku, MD, [facility]   Primary Care Sports Medicine Primary Care and Sports Medicine at [facility] [place]

## 2024-09-17 ENCOUNTER — Other Ambulatory Visit: Payer: Self-pay | Admitting: Internal Medicine

## 2024-09-17 DIAGNOSIS — J4520 Mild intermittent asthma, uncomplicated: Secondary | ICD-10-CM

## 2024-09-19 NOTE — Telephone Encounter (Signed)
 Requested medication (s) are due for refill today: yes  Requested medication (s) are on the active medication list: yes  Last refill:  08/21/24  Future visit scheduled: no  Notes to clinic:  Medication not assigned to a protocol, review manually.      Requested Prescriptions  Pending Prescriptions Disp Refills   AIRSUPRA  90-80 MCG/ACT AERO [Pharmacy Med Name: AIRSUPRA  90-80MCG INH(120 PUFFS)] 10.7 g 1    Sig: INHALE 2 PUFFS INTO THE LUNGS EVERY 6 HOURS AS NEEDED     Off-Protocol Failed - 09/19/2024  1:12 PM      Failed - Medication not assigned to a protocol, review manually.      Passed - Valid encounter within last 12 months    Recent Outpatient Visits           2 weeks ago Internal derangement of left knee   [place] [facility], Selinda PARAS, MD   4 weeks ago Mild intermittent asthma without complication   [facility] at [facility], Leita DEL, MD   1 month ago Acute pain of left knee   [facility] Medicine at [facility], Vinay K, MD   5 months ago Annual physical exam   [facility] Primary Care & Sports Medicine at [facility], Leita DEL, MD

## 2024-10-30 NOTE — Progress Notes (Signed)
 ANNUAL GYNECOLOGICAL EXAM  SUBJECTIVE  HPI  Brianna Marshall is a 33 y.o.-year-old [id number] who presents for an annual gynecological exam today.  She denies abnormal vaginal bleeding or discharge and UTI symptoms. She reports pain her LLQ that feels like the pain she has had with ovarian cysts in the past. She also reports ongoing dyspareunia. She states that intercourse has always been painful, and she has difficulty inserting a tampon. She had a vulvar/vaginal injury as a child (age 22-5?) that required stitches. She also reports heart palpitations that occur fairly regularly. She has not noticed a pattern to them. Sometimes, they occur every time she stands up or changes position. Sometimes she feels they are related to anxiety. The episodes typically last for several minutes and experiences lightheadedness when they occur.  Medical/Surgical History Past Medical History:  Diagnosis Date   Allergy    Anemia in pregnancy 05/30/2018   Antepartum mild preeclampsia, third trimester 07/30/2018   Anxiety    Asthma    COVID-19 02/2020   Depression    Elevated blood pressure affecting pregnancy in third trimester, antepartum 05/05/2023   Family history of pancreatic cancer    GERD (gastroesophageal reflux disease)    Gestational diabetes 06/11/2018   Gestational diabetes mellitus (GDM) affecting third pregnancy 07/29/2018   Gestational diabetes mellitus (GDM) controlled on oral hypoglycemic drug 06/11/2018   Headache, migraine 07/14/2015   approx 2x/month   Hyperemesis 06/05/2018   Infertility, anovulation 03/10/2017   Nausea/vomiting in pregnancy 01/17/2018   Obesity    PCOS (polycystic ovarian syndrome) 07/08/2016   Pre-eclampsia, severe, delivered 07/30/2018   Prediabetes 07/08/2016   Hgb 5.8  04/2015    Supervision of high risk pregnancy, antepartum 01/02/2018   Clinic [facility] Prenatal Labs Dating Early US  Blood type: O/Positive/-- (01/29 1038)  Genetic Screen 1  Screen: Neg Antibody:Negative (01/29 1038) Anatomic US  Normal, anterior placenta,  Rubella: <0.90 (01/29 1038) Varicella: Imm GTT Early: 123 Third trimester:  RPR: Non Reactive (01/29 1038)  Rhogam n/a HBsAg: Negative (01/29 1038)  TDaP vaccine        05/30/2018    Flu Shot: 09/2017 HIV: Non Reac   Vertigo    for short time after covid   Vitamin D  deficiency 07/08/2016   18.7 in 04/2015    Past Surgical History:  Procedure Laterality Date   ENDOSCOPIC CONCHA BULLOSA RESECTION Right 12/30/2021   Procedure: RIGHT CONCHA BULLOSA REDUCTION VIA NASAL ENDOSCOPY;  Surgeon: Edda Mt, MD;  Location: [facility];  Service: ENT;  Laterality: Right;   INCISE AND DRAIN ABCESS Right 2011   Posterior Thigh   sebaceous cyst removal  07/2017   Back of neck   SEPTOPLASTY N/A 12/30/2021   Procedure: SEPTOPLASTY;  Surgeon: Edda Mt, MD;  Location: [facility];  Service: ENT;  Laterality: N/A;   TURBINATE REDUCTION Right 12/30/2021   Procedure: PARTIAL EXCISION RIGHT INFERIOR TURBINATE;  Surgeon: Juengel, Paul, MD;  Location: [facility];  Service: ENT;  Laterality: Right;   WISDOM TOOTH EXTRACTION      Social History Lives with husband, children, and niece. 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 (Menstrual status: IUD). Has not had a period since the birth of her last child. Last Pap: 12/23/22 Contraception: IUD  Prevention Mammogram: at 41 Colonoscopy: at 46 Flu shot/vaccines  Current Medications Outpatient Medications Prior to Visit  Medication Sig   AIRSUPRA  90-80 MCG/ACT AERO INHALE 2 PUFFS INTO THE LUNGS EVERY 6 HOURS AS NEEDED   buPROPion  ER (WELLBUTRIN SR) 100 MG 12 hr tablet Take 100 mg by mouth 2 (two) times daily.   levonorgestrel  (MIRENA ) 20 MCG/DAY IUD 1 each by Intrauterine route once.   loratadine (CLARITIN REDITABS) 10 MG dissolvable tablet Take 10 mg by mouth daily.   meloxicam  (MOBIC ) 15 MG tablet Take 1 tablet (15 mg total) by mouth daily. X 2 weeks then daily PRN. Take with food.   montelukast  (SINGULAIR ) 10 MG tablet Take 1 tablet (10 mg total) by mouth at bedtime.   naproxen  (NAPROSYN ) 500 MG tablet Take 1 tablet (500 mg total) by mouth 2 (two) times daily with a meal.   sertraline  (ZOLOFT ) 100 MG tablet Take 1.5 tablets (150 mg total) by mouth daily. (Patient taking differently: Take 200 mg by mouth daily.)   SUMAtriptan  (IMITREX ) 100 MG tablet TAKE 1 TABLET BY MOUTH EVERY 2 HOURS AS NEEDED FOR MIGRAINE. MAY REPEAT IN 2 HOUR IF HEADACHE PERSISTS OR RECURS   topiramate  (TOPAMAX ) 50 MG tablet Take 1 tablet (50 mg total) by mouth 2 (two) times daily.   No facility-administered medications prior to visit.        ROS Constitutional: Denied constitutional symptoms, night sweats, recent illness, fatigue, fever, insomnia and weight loss.  Eyes: Denied eye symptoms, eye pain, photophobia, vision change and visual disturbance.  Ears/Nose/Throat/Neck: Denied ear, nose, throat or neck symptoms, hearing loss, nasal discharge, sinus congestion and sore throat.  Cardiovascular: Palpitations with lightheadedness, mild chest pain  Respiratory: +asthma symptoms  Gastrointestinal: Denied gastro-esophageal reflux, melena, nausea and vomiting.  Genitourinary: See HPI  Musculoskeletal: Denied musculoskeletal symptoms, stiffness, swelling, muscle weakness and myalgia.  Dermatologic: Denied dermatology symptoms, rash and scar.  Neurologic: Denied neurology symptoms, dizziness, headache, neck pain and syncope.  Psychiatric: Denied psychiatric symptoms, anxiety and depression.  Endocrine: Denied endocrine symptoms including hot  flashes and night sweats.    OBJECTIVE  BP 122/89   Pulse 82   Ht 5' 8 (1.727 m)   Wt 234 lb (106.1 kg)   Breastfeeding No   BMI 35.58 kg/m    Physical examination General NAD, Conversant  HEENT Atraumatic; Op clear with mmm.  Normo-cephalic. Pupils reactive. Anicteric sclerae  Thyroid/Neck Smooth without nodularity or enlargement. Normal ROM.  Neck Supple.  Skin No rashes, lesions or ulceration. Normal palpated skin turgor. No nodularity.  Breasts: No masses or discharge.  Symmetric.  Palpable nodule in right axilla, smooth, mobile, approx 1.5cm x 0.5 cm  Lungs: Clear to auscultation.No rales or wheezes. Normal Respiratory effort, no retractions.  Heart: NSR.  No murmurs or rubs appreciated. No peripheral edema  Abdomen: Soft.  Non-tender.  No masses.  No HSM. No hernia  Extremities: Moves all appropriately.  Normal ROM for age. No lymphadenopathy.  Neuro: Oriented to PPT.  Normal mood. Normal affect.     Pelvic:   Vulva: Normal appearance.  No lesions. Cotton swab test shows no tenderness on external genitalia;  moderate discomfort on right lower inner labia and upper left inner labia. No pain at vestibule.  Vagina: No lesions or abnormalities noted. Moderate amount of creamy white discharge.  Urethra No masses tenderness or scarring.  Perineum: Normal exam.  No lesions.    ASSESSMENT  1) Annual exam 2) Dyspareunia 3) Axillary cyst 4) Palpitations  PLAN 1) Physical exam as noted. Discussed healthy lifestyle choices and preventive care. Routine labs done with PCP. Declines STI testing. Pap due  2) Referral sent for pelvic PT. Will try vaginal estrogen. Consider vaginal diazepam if no improvement. 3) Will monitor cyst over the next month. Consider US  if if does not resolve. 4) Discussed possible causes of palpitations, including medications, dehydration, caffeine, anxiety. Referral sent to cardiology  Return in one year for annual exam or as needed for  concerns.   Kellie Murrill, CNM

## 2024-11-04 ENCOUNTER — Ambulatory Visit (INDEPENDENT_AMBULATORY_CARE_PROVIDER_SITE_OTHER): Admitting: Obstetrics

## 2024-11-04 ENCOUNTER — Other Ambulatory Visit (HOSPITAL_COMMUNITY)
Admission: RE | Admit: 2024-11-04 | Discharge: 2024-11-04 | Disposition: A | Discharge status: Home / Self Care | Source: Ambulatory Visit | Attending: Obstetrics | Admitting: Obstetrics

## 2024-11-04 ENCOUNTER — Encounter: Payer: Self-pay | Admitting: Obstetrics

## 2024-11-04 VITALS — BP 122/89 | HR 82 | Ht 68.0 in | Wt 234.0 lb

## 2024-11-04 DIAGNOSIS — Z01411 Encounter for gynecological examination (general) (routine) with abnormal findings: Secondary | ICD-10-CM | POA: Diagnosis not present

## 2024-11-04 DIAGNOSIS — R002 Palpitations: Secondary | ICD-10-CM

## 2024-11-04 DIAGNOSIS — F526 Dyspareunia not due to a substance or known physiological condition: Secondary | ICD-10-CM

## 2024-11-04 DIAGNOSIS — L729 Follicular cyst of the skin and subcutaneous tissue, unspecified: Secondary | ICD-10-CM

## 2024-11-04 DIAGNOSIS — N941 Unspecified dyspareunia: Secondary | ICD-10-CM | POA: Diagnosis not present

## 2024-11-04 MED ORDER — ESTRADIOL 0.01 % VA CREA: 0.2500 | TOPICAL_CREAM | Freq: Every day | VAGINAL | 3 refills | Status: AC

## 2024-11-05 LAB — CERVICOVAGINAL ANCILLARY ONLY
Bacterial Vaginitis (gardnerella): POSITIVE — AB
Candida Glabrata: NEGATIVE
Candida Vaginitis: NEGATIVE
Comment: NEGATIVE
Comment: NEGATIVE
Comment: NEGATIVE

## 2024-11-06 ENCOUNTER — Ambulatory Visit: Payer: Self-pay | Admitting: Obstetrics

## 2024-11-06 ENCOUNTER — Other Ambulatory Visit: Payer: Self-pay | Admitting: Obstetrics

## 2024-11-06 MED ORDER — METRONIDAZOLE 500 MG PO TABS
500.0000 mg | ORAL_TABLET | Freq: Two times a day (BID) | ORAL | 0 refills | Status: AC
Start: 2024-11-06 — End: ?

## 2024-11-06 NOTE — Progress Notes (Signed)
+  BV. Rx for metronidazole 500 mg PO BID x 7 days sent to pharmacy. [facility] notified via MyChart.  M. Chryl Heck, CNM

## 2024-11-20 ENCOUNTER — Ambulatory Visit: Attending: Internal Medicine | Admitting: Internal Medicine

## 2024-11-20 ENCOUNTER — Encounter: Payer: Self-pay | Admitting: Internal Medicine

## 2024-11-20 ENCOUNTER — Ambulatory Visit

## 2024-11-20 VITALS — BP 135/91 | HR 108 | Ht 68.0 in | Wt 238.6 lb

## 2024-11-20 DIAGNOSIS — R002 Palpitations: Secondary | ICD-10-CM

## 2024-11-20 DIAGNOSIS — R42 Dizziness and giddiness: Secondary | ICD-10-CM

## 2024-11-20 DIAGNOSIS — R55 Syncope and collapse: Secondary | ICD-10-CM

## 2024-11-20 DIAGNOSIS — R000 Tachycardia, unspecified: Secondary | ICD-10-CM

## 2024-11-20 DIAGNOSIS — R030 Elevated blood-pressure reading, without diagnosis of hypertension: Secondary | ICD-10-CM

## 2024-11-20 NOTE — Patient Instructions (Signed)
 Medication Instructions:  Your physician recommends that you continue on your current medications as directed. Please refer to the Current Medication list given to you today.    *If you need a refill on your cardiac medications before your next appointment, please call your pharmacy*  Lab Work: No labs ordered today    Testing/Procedures: Your physician has requested that you have an echocardiogram. Echocardiography is a painless test that uses sound waves to create images of your heart. It provides your doctor with information about the size and shape of your heart and how well your heart's chambers and valves are working.   You may receive an ultrasound enhancing agent through an IV if needed to better visualize your heart during the echo. This procedure takes approximately one hour.  There are no restrictions for this procedure.  This will take place at [address] [facility], [place] [address]  Please note: We ask at that you not bring children with you during ultrasound (echo/ vascular) testing. Due to room size and safety concerns, children are not allowed in the ultrasound rooms during exams. Our front office staff cannot provide observation of children in our lobby area while testing is being conducted. An adult accompanying a patient to their appointment will only be allowed in the ultrasound room at the discretion of the ultrasound technician under special circumstances. We apologize for any inconvenience.   ZIO XT- Long Term Monitor Instructions  Your physician has requested you wear a ZIO patch monitor for 14 days.  This is a single patch monitor. [employer] supplies one patch monitor per enrollment. Additional stickers are not available. Please do not apply patch if you will be having a Nuclear Stress Test, Echocardiogram, Cardiac CT, MRI, or Chest Xray during the period you would be wearing the monitor. The patch cannot be worn during these tests. You cannot  remove and re-apply the ZIO XT patch monitor.  Your ZIO patch monitor will be mailed 3 day USPS to your address on file. It may take 3-5 days to receive your monitor after you have been enrolled. Once you have received your monitor, please review the enclosed instructions. Your monitor has already been registered assigning a specific monitor serial number to you.  Billing and Patient Assistance Program Information  We have supplied [employer] with any of your insurance information on file for billing purposes.  [employer] offers a sliding scale Patient Assistance Program for patients that do not have insurance, or whose insurance does not completely cover the cost of the ZIO monitor.  You must apply for the Patient Assistance Program to qualify for this discounted rate.  To apply, please call [employer] at [phone], select option 4, select option 2, ask to apply for Patient Assistance Program. Meredeth will ask your household income, and how many people are in your household. They will quote your out-of-pocket cost based on that information. [employer] will also be able to set up a 42-month, interest-free payment plan if needed.  Applying the monitor   Shave hair from upper left chest.  Hold abrader disc by orange tab. Rub abrader in 40 strokes over the upper left chest as indicated in your monitor instructions.  Clean area with 4 enclosed alcohol pads. Let dry.  Apply patch as indicated in monitor instructions. Patch will be placed under collarbone on left side of chest with arrow pointing upward.  Rub patch adhesive wings for 2 minutes. Remove white label marked 1. Remove the white label marked 2.  Rub patch adhesive wings for 2 additional minutes.  While looking in a mirror, press and release button in center of patch. A small green light will flash 3-4 times. This will be your only indicator that the monitor has been turned on.  Do not shower for the first 24 hours. You may shower after the first 24  hours.  Press the button if you feel a symptom. You will hear a small click. Record Date, Time and Symptom in the Patient Logbook.  When you are ready to remove the patch, follow instructions on the last 2 pages of Patient Logbook.  Stick patch monitor into the tabs at the bottom of the return box.  Place Patient Logbook in the blue and white box. Use locking tab on box and tape box closed securely. The blue and white box has prepaid postage on it. Please place it in the mailbox as soon as possible. Your physician should have your test results approximately 7-14 days after the monitor has been mailed back to [facility].  Call [facility] Customer Care at [phone] if you have questions regarding your ZIO XT patch monitor.  Call them immediately if you see an orange light blinking on your monitor.  If your monitor falls off in less than 4 days, contact our Monitor department at [phone].  If your monitor becomes loose or falls off after 4 days call [employer] at [phone] for suggestions on securing your monitor.   Follow-Up: At [facility], you and your health needs are our priority.  As part of our continuing mission to provide you with exceptional heart care, our providers are all part of one team.  This team includes your primary Cardiologist (physician) and Advanced Practice Providers or APPs (Physician Assistants and Nurse Practitioners) who all work together to provide you with the care you need, when you need it.  Your next appointment:   6-8 week(s)  Provider:   You may see Lonni Hanson, MD or one of the following Advanced Practice Providers on your designated Care Team:   Lonni Meager, NP Lesley Maffucci, PA-C Bernardino Bring, PA-C Cadence [place], PA-C Tylene Lunch, NP Barnie Hila, NP

## 2024-11-20 NOTE — Progress Notes (Unsigned)
  Cardiology Office Note:  .   Date:  11/22/2024  ID:  Brianna Marshall, DOB 03-Mar-1991, MRN [id number] PCP: Justus Leita DEL, MD  [facility] Providers Cardiologist:  None     History of Present Illness: .   Brianna Marshall is a 33 y.o. female with history of gestational diabetes, polycystic ovarian syndrome, and depression, who has been referred by Brianna Marshall for evaluation of palpitations.  Brianna Marshall reports that she experiences intermittent palpitations, most often when sitting or standing.  They last a few seconds at a time and are sometimes associated with lightheadedness (she notes some lightheadedness at other times as well).  Palpitations happen a few times per week (sometimes daily).  She notes passing out as a teenager but has otherwise not had any syncope or near-syncope.  She also denies chest pain, shortness of breath, and edema.  Palpitations have been present intermittently for years.  She denies a history of heart disease and prior cardiac testing.  She consumes ~1 caffeinated soda per day.  ROS: See HPI  Studies Reviewed: .   EKG (11/20/2024): Sinus tachycardia with possible left atrial enlargement and rSR' in V1-2.  Risk Assessment/Calculations:          Physical Exam:   VS:  BP (!) 135/91 (BP Location: Right Wrist, Patient Position: Sitting, Cuff Size: Normal)   Pulse (!) 108 Comment: 129 oximeter  Ht 5' 8 (1.727 m)   Wt 238 lb 9.6 oz (108.2 kg)   SpO2 97%   BMI 36.28 kg/m    Wt Readings from Last 3 Encounters:  11/20/24 238 lb 9.6 oz (108.2 kg)  11/04/24 234 lb (106.1 kg)  09/04/24 231 lb (104.8 kg)    General:  NAD. Neck: No JVD or HJR. Lungs: Clear to auscultation bilaterally without wheezes or crackles. Heart: Tachycardic but regular without murmurs, rubs, or gallops. Abdomen: Soft, nontender, nondistended. Extremities: No lower extremity edema.  ASSESSMENT AND PLAN: .    Palpitations, tachycardia,  lightheadedness, and remote syncope: Brianna Marshall reports a long history of intermittent palpitations, sometimes associated with lightheadedness.  She also endorses passing out as a teenager but has not had any recent syncope.  She is noted to be tachycardic at rest today, albeit in sinus rhythm.  Most recent labs, including CBC, chemistries, and thyroid studies in 03/2024 did not show any significant abnormalities.  We have agreed to obtain a 14-day event monitor (Zio-XT) and transthoracic echocardiogram.  Based on results and symptoms, we may need to consider addition of beta-blocker or non-dihydropyridine calcium  channel blocker.  Elevated blood pressure: BP mildly elevated today.  If blood pressure remains elevated at follow-up pharmacotherapy may need to be considered (would consider BB or CCB as initial therapy given aforementioned palpitations/sinus tachycardia).    Dispo: Return to clinic in 6-8 weeks.  Signed, Lonni Hanson, MD

## 2024-11-22 ENCOUNTER — Encounter: Payer: Self-pay | Admitting: Internal Medicine

## 2024-11-22 DIAGNOSIS — R55 Syncope and collapse: Secondary | ICD-10-CM | POA: Insufficient documentation

## 2024-11-22 DIAGNOSIS — R030 Elevated blood-pressure reading, without diagnosis of hypertension: Secondary | ICD-10-CM | POA: Insufficient documentation

## 2024-11-22 DIAGNOSIS — R000 Tachycardia, unspecified: Secondary | ICD-10-CM | POA: Insufficient documentation

## 2024-11-22 DIAGNOSIS — R42 Dizziness and giddiness: Secondary | ICD-10-CM | POA: Insufficient documentation

## 2024-12-16 DIAGNOSIS — R002 Palpitations: Secondary | ICD-10-CM

## 2024-12-16 DIAGNOSIS — R55 Syncope and collapse: Secondary | ICD-10-CM

## 2024-12-16 DIAGNOSIS — R42 Dizziness and giddiness: Secondary | ICD-10-CM | POA: Diagnosis not present

## 2024-12-17 ENCOUNTER — Other Ambulatory Visit: Payer: Self-pay | Admitting: Internal Medicine

## 2024-12-17 ENCOUNTER — Ambulatory Visit: Payer: Self-pay | Admitting: Internal Medicine

## 2024-12-17 DIAGNOSIS — J4520 Mild intermittent asthma, uncomplicated: Secondary | ICD-10-CM

## 2024-12-18 ENCOUNTER — Other Ambulatory Visit: Payer: Self-pay | Admitting: Internal Medicine

## 2024-12-18 DIAGNOSIS — R030 Elevated blood-pressure reading, without diagnosis of hypertension: Secondary | ICD-10-CM

## 2024-12-18 DIAGNOSIS — R55 Syncope and collapse: Secondary | ICD-10-CM

## 2024-12-18 DIAGNOSIS — R42 Dizziness and giddiness: Secondary | ICD-10-CM

## 2024-12-18 DIAGNOSIS — R002 Palpitations: Secondary | ICD-10-CM

## 2024-12-18 DIAGNOSIS — R000 Tachycardia, unspecified: Secondary | ICD-10-CM

## 2024-12-19 NOTE — Telephone Encounter (Signed)
 Requested medication (s) are due for refill today: Yes  Requested medication (s) are on the active medication list: Yes  Last refill:  09/19/24  Future visit scheduled: No  Notes to clinic:  Unable to refill due to no refill protocol for this medication, appointment needed for TOC.     Requested Prescriptions  Pending Prescriptions Disp Refills   AIRSUPRA  90-80 MCG/ACT AERO [Pharmacy Med Name: AIRSUPRA  90-80MCG INH(120 PUFFS)] 10.7 g 1    Sig: INHALE 2 PUFFS INTO THE LUNGS EVERY 6 HOURS AS NEEDED     Off-Protocol Failed - 12/19/2024  9:27 AM      Failed - Medication not assigned to a protocol, review manually.      Passed - Valid encounter within last 12 months    Recent Outpatient Visits           3 months ago Internal derangement of left knee   [facility], Selinda PARAS, MD   4 months ago Mild intermittent asthma without complication   [facility] [facility], Leita DEL, MD   4 months ago Acute pain of left knee   [facility], Vinay K, MD   8 months ago Annual physical exam   [facility] at [facility], Leita DEL, MD       Future Appointments             In 2 weeks Dunn, Bernardino HERO, PA-C [facility]

## 2024-12-20 ENCOUNTER — Ambulatory Visit: Attending: Internal Medicine

## 2024-12-20 ENCOUNTER — Other Ambulatory Visit: Payer: Self-pay

## 2024-12-20 DIAGNOSIS — R55 Syncope and collapse: Secondary | ICD-10-CM

## 2024-12-20 DIAGNOSIS — R42 Dizziness and giddiness: Secondary | ICD-10-CM

## 2024-12-20 LAB — ECHOCARDIOGRAM COMPLETE
AR max vel: 3.09 cm2
AV Area VTI: 3.04 cm2
AV Area mean vel: 3.02 cm2
AV Mean grad: 4 mmHg
AV Peak grad: 7.1 mmHg
Ao pk vel: 1.33 m/s
Area-P 1/2: 3.5 cm2
Calc EF: 58.9 %
S' Lateral: 2.8 cm
Single Plane A2C EF: 59.1 %
Single Plane A4C EF: 56.1 %

## 2024-12-23 ENCOUNTER — Ambulatory Visit: Payer: Self-pay | Admitting: Internal Medicine

## 2024-12-31 NOTE — Progress Notes (Unsigned)
 "  Cardiology Office Note    Date:  01/03/2025   ID:  Brianna Marshall, DOB 07-31-1991, MRN [id number]  PCP:  Patient, No Pcp Per  Cardiologist:  Lonni Hanson, MD  Electrophysiologist:  None   Chief Complaint: Follow-up  History of Present Illness:   Brianna Marshall is a 34 y.o. female with history of gestational diabetes, PCOS, and depression who presents for follow-up of Zio patch and echo.  She was evaluated by Dr. Hanson as a new patient in 11/2024 for evaluation of palpitations described as intermittent and most often occurring when sitting or standing. Palpitations would last for a few seconds at a time and were sometimes associated with lightheadedness, occurring a few times per week.  She reported a history of syncope as a teenager, though otherwise had not had any syncope or near syncope since.  EKG showed sinus tachycardia with a rate of 108 bpm.  BP was mildly elevated at 135/91.  Zio patch in 11/2024 showed a predominant rhythm was sinus with an average rate of 89 bpm with rare atrial and ventricular ectopy.  Patient triggered events corresponded to sinus rhythm and PVCs.  Echo in 12/2024 showed an EF of 55 to 60%, no regional wall motion abnormalities, grade 1 diastolic dysfunction (with LV diastolic function normal based on independent MD review), normal RV systolic function, ventricular cavity size, and RVSP, mild mitral regurgitation, and a normal CVP.  She comes in today and is doing well from a cardiac perspective, currently without symptoms of angina or cardiac decompensation.  She continues to have intermittent palpitations typically occurring several days per week lasting several seconds to upwards of 30 seconds.  At times, there is some associated dizziness.  No near-syncope or syncope.  She also notes some exertional chest tightness and shortness of breath that has been longstanding and previously attributed to underlying asthma.  No significant  lower extremity swelling.  She was never a smoker though has a history of prolonged secondhand smoke exposure.  No significant alcohol intake, having had only one beer in the past several months.  No illicit substances.   Labs independently reviewed: 03/2024 - TSH normal, TC 187, TG 122, HDL 48, LDL 117, A1c 5.6, BUN 16, serum creatinine 0.85, potassium 4.2, BUN 4.6, AST/ALT normal, Hgb 13.7, PLT 279  Past Medical History:  Diagnosis Date   Allergy    Anemia in pregnancy 05/30/2018   Antepartum mild preeclampsia, third trimester 07/30/2018   Anxiety    Asthma    COVID-19 02/2020   Depression    Elevated blood pressure affecting pregnancy in third trimester, antepartum 05/05/2023   Family history of pancreatic cancer    GERD (gastroesophageal reflux disease)    Gestational diabetes 06/11/2018   Gestational diabetes mellitus (GDM) affecting third pregnancy 07/29/2018   Gestational diabetes mellitus (GDM) controlled on oral hypoglycemic drug 06/11/2018   Headache, migraine 07/14/2015   approx 2x/month   Hyperemesis 06/05/2018   Infertility, anovulation 03/10/2017   Nausea/vomiting in pregnancy 01/17/2018   Obesity    PCOS (polycystic ovarian syndrome) 07/08/2016   Pre-eclampsia, severe, delivered 07/30/2018   Prediabetes 07/08/2016   Hgb 5.8  04/2015    Supervision of high risk pregnancy, antepartum 01/02/2018   [facility] Prenatal Labs Dating Early US  Blood type: O/Positive/-- (01/29 1038)  Genetic Screen 1 Screen: Neg Antibody:Negative (01/29 1038) Anatomic US  Normal, anterior placenta,  Rubella: <0.90 (01/29 1038) Varicella: Imm GTT Early: 123 Third trimester:  RPR: Non Reactive (01/29  1038)  Rhogam n/a HBsAg: Negative (01/29 1038)  TDaP vaccine        05/30/2018    Flu Shot: 09/2017 HIV: Non Reac   Vertigo    for short time after covid   Vitamin D  deficiency 07/08/2016   18.7 in 04/2015     Past Surgical History:  Procedure Laterality Date   ENDOSCOPIC CONCHA BULLOSA  RESECTION Right 12/30/2021   Procedure: RIGHT CONCHA BULLOSA REDUCTION VIA NASAL ENDOSCOPY;  Surgeon: Edda Mt, MD;  Location: [facility];  Service: ENT;  Laterality: Right;   INCISE AND DRAIN ABCESS Right 2011   Posterior Thigh   sebaceous cyst removal  07/2017   Back of neck   SEPTOPLASTY N/A 12/30/2021   Procedure: SEPTOPLASTY;  Surgeon: Edda Mt, MD;  Location: [facility];  Service: ENT;  Laterality: N/A;   TURBINATE REDUCTION Right 12/30/2021   Procedure: PARTIAL EXCISION RIGHT INFERIOR TURBINATE;  Surgeon: Juengel, Paul, MD;  Location: [facility];  Service: ENT;  Laterality: Right;   WISDOM TOOTH EXTRACTION      Current Medications: Active Medications[1]  Allergies:   Shellfish allergy, Shellfish protein-containing drug products, Amoxicillin, Latex, and Lidocaine    Social History   Socioeconomic History   Marital status: Married    Spouse name: Joane   Number of children: 1   Years of education: 12   Highest education level: Some college, no degree  Occupational History   Occupation: Printmaker  Tobacco Use   Smoking status: Never   Smokeless tobacco: Never  Vaping Use   Vaping status: Never Used  Substance and Sexual Activity   Alcohol use: Not Currently    Alcohol/week: 0.0 standard drinks of alcohol    Comment: rarely   Drug use: No   Sexual activity: Yes    Partners: Male    Birth control/protection: I.U.D.  Other Topics Concern   Not on file  Social History Narrative   Not on file   Social Drivers of Health   Tobacco Use: Low Risk (12/13/2024)   [facility]   Patient History    Smoking Tobacco Use: Never    Smokeless Tobacco Use: Never    Passive Exposure: Past  Financial Resource Strain: Medium Risk (07/29/2024)   Overall Financial Resource Strain (CARDIA)    Difficulty of Paying Living Expenses: Somewhat hard  Food Insecurity: Food Insecurity Present (07/29/2024)   Epic    Worried About  Programme Researcher, Broadcasting/film/video in the Last Year: Sometimes true    Ran Out of Food in the Last Year: Never true  Transportation Needs: No Transportation Needs (07/29/2024)   Epic    Lack of Transportation (Medical): No    Lack of Transportation (Non-Medical): No  Physical Activity: Insufficiently Active (07/29/2024)   Exercise Vital Sign    Days of Exercise per Week: 2 days    Minutes of Exercise per Session: 60 min  Stress: Stress Concern Present (07/29/2024)   [employer] of Occupational Health - Occupational Stress Questionnaire    Feeling of Stress: Very much  Social Connections: Socially Isolated (07/29/2024)   Social Connection and Isolation Panel    Frequency of Communication with Friends and Family: Never    Frequency of Social Gatherings with Friends and Family: Never    Attends Religious Services: Never    Database Administrator or Organizations: No    Attends Engineer, Structural: Not on file    Marital Status: Married  Depression (PHQ2-9): High  Risk (09/04/2024)   Depression (PHQ2-9)    PHQ-2 Score: 16  Alcohol Screen: Low Risk (04/04/2024)   Alcohol Screen    Last Alcohol Screening Score (AUDIT): 0  Housing: Low Risk (07/29/2024)   Epic    Unable to Pay for Housing in the Last Year: No    Number of Times Moved in the Last Year: 0    Homeless in the Last Year: No  Utilities: Not At Risk (11/16/2022)   AHC Utilities    Threatened with loss of utilities: No  Health Literacy: Not on file     Family History:  The patient's family history includes ADD / ADHD in her brother; Alcohol abuse in her father and maternal aunt; Bipolar disorder in her maternal aunt; Breast cancer (age of onset: 44) in her maternal aunt; COPD in her paternal grandfather and paternal grandmother; Depression in her paternal grandmother; Diabetes in her maternal aunt, maternal grandmother, and maternal uncle; Drug abuse in her father; Healthy in her sister; Heart murmur in her father; OCD in her  brother; Osteoporosis in her mother; Pancreatic cancer (age of onset: 22) in her maternal grandmother; Prostate cancer in her maternal uncle; Varicose Veins in her mother.  ROS:   12-point review of systems is negative unless otherwise noted in the HPI.   EKGs/Labs/Other Studies Reviewed:    Studies reviewed were summarized above. The additional studies were reviewed today:  2D echo 12/20/2024: 1. Left ventricular ejection fraction, by estimation, is 55 to 60%. Left  ventricular ejection fraction by 2D MOD biplane is 58.9 %. The left  ventricle has normal function. The left ventricle has no regional wall  motion abnormalities. Left ventricular  diastolic parameters are consistent with Grade I diastolic dysfunction  (impaired relaxation).   2. Right ventricular systolic function is normal. The right ventricular  size is normal. There is normal pulmonary artery systolic pressure. The  estimated right ventricular systolic pressure is 26.3 mmHg.   3. The mitral valve is normal in structure. Mild mitral valve  regurgitation. No evidence of mitral stenosis.   4. The aortic valve is normal in structure. Aortic valve regurgitation is  not visualized. No aortic stenosis is present.   5. The inferior vena cava is normal in size with greater than 50%  respiratory variability, suggesting right atrial pressure of 3 mmHg.  __________  Zio patch 11/2024:   The patient was monitored for 14 days.   The predominant rhythm was sinus with an average rate of 89 bpm (range 46-172 bpm).   There were rare PACs and PVCs.   No sustained arrhythmia or prolonged pause occurred.   Patient triggered events correspond to sinus rhythm and PVCs.   Predominantly sinus rhythm with rare PACs and PVCs.  No significant arrhythmia was observed.    EKG:  EKG is ordered today.  The EKG ordered today demonstrates NSR, 93 bpm, baseline wandering, no acute ST-T changes  Recent Labs: 04/05/2024: ALT 16; BUN 16;  Creatinine, Ser 0.85; Hemoglobin 13.7; Platelets 279; Potassium 4.2; Sodium 138; TSH 1.900  Recent Lipid Panel    Component Value Date/Time   CHOL 187 04/05/2024 1408   TRIG 122 04/05/2024 1408   HDL 48 04/05/2024 1408   CHOLHDL 3.9 04/05/2024 1408   LDLCALC 117 (H) 04/05/2024 1408    PHYSICAL EXAM:    VS:  BP 120/85   Pulse 91   Ht 5' 8 (1.727 m)   Wt 242 lb (109.8 kg)   SpO2 98%  BMI 36.80 kg/m   BMI: Body mass index is 36.8 kg/m.  Physical Exam Vitals reviewed.  Constitutional:      Appearance: She is well-developed.  HENT:     Head: Normocephalic and atraumatic.  Eyes:     General:        Right eye: No discharge.        Left eye: No discharge.  Cardiovascular:     Rate and Rhythm: Normal rate and regular rhythm.     Heart sounds: Normal heart sounds, S1 normal and S2 normal. Heart sounds not distant. No midsystolic click and no opening snap. No murmur heard.    No friction rub.  Pulmonary:     Effort: Pulmonary effort is normal. No respiratory distress.     Breath sounds: Normal breath sounds. No decreased breath sounds, wheezing, rhonchi or rales.  Musculoskeletal:     Cervical back: Normal range of motion.     Right lower leg: No edema.     Left lower leg: No edema.  Skin:    General: Skin is warm and dry.     Nails: There is no clubbing.  Neurological:     Mental Status: She is alert and oriented to person, place, and time.  Psychiatric:        Speech: Speech normal.        Behavior: Behavior normal.        Thought Content: Thought content normal.        Judgment: Judgment normal.     Wt Readings from Last 3 Encounters:  01/03/25 242 lb (109.8 kg)  11/20/24 238 lb 9.6 oz (108.2 kg)  11/04/24 234 lb (106.1 kg)     ASSESSMENT & PLAN:   Palpitations, PACs/PVCs, sinus tachycardia, lightheadedness, and remote syncope: She continues to note paroxysms of palpitations.  Reassuring Zio patch showing sinus rhythm with rare atrial and ventricular ectopy  without evidence of significant arrhythmia, prolonged pauses, or high-grade AV block.  Echo without significant structural abnormality.  Laboratory evaluation unrevealing.  Trial of Toprol -XL 12.5 mg nightly.  Chest pain with dyspnea: Previously related to underlying asthma.  Low likelihood of this being related to coronary insufficiency given female status with age of 56, non-smoker (though was exposed to significant secondhand smoke) and without diabetes.  Echo without significant structural abnormality.  Pursue GXT to evaluate for ischemic EKG changes as well as for exercise-induced ectopy.  Elevated BP: Blood pressure is well-controlled in the office today 120/85.  Trial of adding Toprol -XL 12.5 mg nightly as outlined above.    Disposition: F/u with Dr. Mady or an APP in 2 months.   Medication Adjustments/Labs and Tests Ordered: Current medicines are reviewed at length with the patient today.  Concerns regarding medicines are outlined above. Medication changes, Labs and Tests ordered today are summarized above and listed in the Patient Instructions accessible in Encounters.   Signed, Bernardino Bring, PA-C 01/03/2025 4:21 PM     [facility] [place] [address] [place], [place] [address] [phone]     [1]  Current Meds  Medication Sig   Albuterol -Budesonide (AIRSUPRA ) 90-80 MCG/ACT AERO INHALE 2 PUFFS INTO THE LUNGS EVERY 6 HOURS AS NEEDED   estradiol  (ESTRACE ) 0.01 % CREA vaginal cream Place 0.25 Applicatorfuls vaginally at bedtime. Use nightly for 2 weeks then 2-3 times a week   levonorgestrel  (MIRENA ) 20 MCG/DAY IUD 1 each by Intrauterine route once.   loratadine (CLARITIN REDITABS) 10 MG dissolvable tablet Take 10  mg by mouth daily.   metoprolol  succinate (TOPROL  XL) 25 MG 24 hr tablet Take 0.5 tablets (12.5 mg total) by mouth daily.   montelukast  (SINGULAIR ) 10 MG tablet Take 1 tablet (10 mg total) by mouth at bedtime.   sertraline  (ZOLOFT ) 100 MG  tablet Take 1.5 tablets (150 mg total) by mouth daily.   SUMAtriptan  (IMITREX ) 100 MG tablet TAKE 1 TABLET BY MOUTH EVERY 2 HOURS AS NEEDED FOR MIGRAINE. MAY REPEAT IN 2 HOUR IF HEADACHE PERSISTS OR RECURS   "

## 2025-01-03 ENCOUNTER — Encounter: Payer: Self-pay | Admitting: Physician Assistant

## 2025-01-03 ENCOUNTER — Ambulatory Visit: Attending: Physician Assistant | Admitting: Physician Assistant

## 2025-01-03 VITALS — BP 120/85 | HR 91 | Ht 68.0 in | Wt 242.0 lb

## 2025-01-03 DIAGNOSIS — R42 Dizziness and giddiness: Secondary | ICD-10-CM

## 2025-01-03 DIAGNOSIS — R0609 Other forms of dyspnea: Secondary | ICD-10-CM | POA: Diagnosis not present

## 2025-01-03 DIAGNOSIS — I493 Ventricular premature depolarization: Secondary | ICD-10-CM

## 2025-01-03 DIAGNOSIS — R072 Precordial pain: Secondary | ICD-10-CM

## 2025-01-03 DIAGNOSIS — R55 Syncope and collapse: Secondary | ICD-10-CM

## 2025-01-03 DIAGNOSIS — R030 Elevated blood-pressure reading, without diagnosis of hypertension: Secondary | ICD-10-CM | POA: Diagnosis not present

## 2025-01-03 DIAGNOSIS — R002 Palpitations: Secondary | ICD-10-CM

## 2025-01-03 DIAGNOSIS — I491 Atrial premature depolarization: Secondary | ICD-10-CM | POA: Diagnosis not present

## 2025-01-03 DIAGNOSIS — R000 Tachycardia, unspecified: Secondary | ICD-10-CM | POA: Diagnosis not present

## 2025-01-03 MED ORDER — METOPROLOL SUCCINATE ER 25 MG PO TB24: 12.5000 mg | ORAL_TABLET | Freq: Every day | ORAL | 3 refills | Status: DC

## 2025-01-03 NOTE — Patient Instructions (Signed)
 Medication Instructions:  Your physician recommends the following medication changes.  START TAKING: Toprol -XL 12.5 mg nightly  *If you need a refill on your cardiac medications before your next appointment, please call your pharmacy*  Lab Work: None ordered at this time  If you have labs (blood work) drawn today and your tests are completely normal, you will receive your results only by: MyChart Message (if you have MyChart) OR A paper copy in the mail If you have any lab test that is abnormal or we need to change your treatment, we will call you to review the results.  Testing/Procedures: Your provider has ordered a exercise tolerance test. This test will evaluate the blood supply to your heart muscle during periods of exercise and rest. For this test, you will raise your heart rate by walking on a treadmill at different levels.   you may eat a light breakfast/ lunch prior to your procedure no caffeine for 24 hours prior to your test (coffee, tea, soft drinks, or chocolate)  no smoking/ vaping for 4 hours prior to your test you may take your regular medications the day of your test except for:   - hold any beta blockers (such as metoprolol , carvedilol, nebivolol, propanolol) bring any inhalers with you to your test wear comfortable clothing & tennis/ non-skid shoes to walk on the treadmill  This will take place at [address] [facility])  [place] [address]   Follow-Up: At [facility], you and your health needs are our priority.  As part of our continuing mission to provide you with exceptional heart care, our providers are all part of one team.  This team includes your primary Cardiologist (physician) and Advanced Practice Providers or APPs (Physician Assistants and Nurse Practitioners) who all work together to provide you with the care you need, when you need it.  Your next appointment:   2 month(s)  Provider:   You may see Lonni Hanson, MD or  Bernardino Bring, PA-C  Other Instructions For free guided meditation apps, Insight Timer, Healthy Minds Program, and Smiling Mind offer extensive free libraries or fully free models, while popular apps like Calm and Headspace provide limited free content or trials before requiring subscription, with alternatives like [employer], [address], and free YouTube channels also providing excellent resources.

## 2025-02-26 ENCOUNTER — Ambulatory Visit

## 2025-03-07 ENCOUNTER — Ambulatory Visit: Admitting: Physician Assistant
